# Patient Record
Sex: Male | Born: 1935 | Race: White | Hispanic: No | Marital: Married | Smoking: Former smoker
Health system: Southern US, Community
[De-identification: ages and names within clinical notes are randomized; demographics above are authoritative.]

## PROBLEM LIST (undated history)

## (undated) DIAGNOSIS — N183 Chronic kidney disease, stage 3 (moderate): Secondary | ICD-10-CM

## (undated) DIAGNOSIS — D539 Nutritional anemia, unspecified: Secondary | ICD-10-CM

## (undated) DIAGNOSIS — I447 Left bundle-branch block, unspecified: Secondary | ICD-10-CM

## (undated) DIAGNOSIS — Z9981 Dependence on supplemental oxygen: Secondary | ICD-10-CM

## (undated) DIAGNOSIS — E785 Hyperlipidemia, unspecified: Secondary | ICD-10-CM

## (undated) DIAGNOSIS — I739 Peripheral vascular disease, unspecified: Secondary | ICD-10-CM

## (undated) DIAGNOSIS — I10 Essential (primary) hypertension: Secondary | ICD-10-CM

## (undated) DIAGNOSIS — M1990 Unspecified osteoarthritis, unspecified site: Secondary | ICD-10-CM

## (undated) DIAGNOSIS — I208 Other forms of angina pectoris: Secondary | ICD-10-CM

## (undated) DIAGNOSIS — I480 Paroxysmal atrial fibrillation: Secondary | ICD-10-CM

## (undated) DIAGNOSIS — I219 Acute myocardial infarction, unspecified: Secondary | ICD-10-CM

## (undated) DIAGNOSIS — I509 Heart failure, unspecified: Secondary | ICD-10-CM

## (undated) DIAGNOSIS — I2510 Atherosclerotic heart disease of native coronary artery without angina pectoris: Secondary | ICD-10-CM

## (undated) DIAGNOSIS — I779 Disorder of arteries and arterioles, unspecified: Secondary | ICD-10-CM

## (undated) DIAGNOSIS — I519 Heart disease, unspecified: Secondary | ICD-10-CM

## (undated) DIAGNOSIS — Z5189 Encounter for other specified aftercare: Secondary | ICD-10-CM

## (undated) DIAGNOSIS — K219 Gastro-esophageal reflux disease without esophagitis: Secondary | ICD-10-CM

## (undated) HISTORY — PX: BACK SURGERY: SHX140

## (undated) HISTORY — PX: SPINAL FUSION: SHX223

## (undated) HISTORY — DX: Nutritional anemia, unspecified: D53.9

## (undated) HISTORY — DX: Essential (primary) hypertension: I10

## (undated) HISTORY — DX: Paroxysmal atrial fibrillation: I48.0

## (undated) HISTORY — DX: Peripheral vascular disease, unspecified: I73.9

## (undated) HISTORY — DX: Disorder of arteries and arterioles, unspecified: I77.9

---

## 1992-03-26 HISTORY — PX: CORONARY ARTERY BYPASS GRAFT: SHX141

## 1997-11-30 ENCOUNTER — Ambulatory Visit (HOSPITAL_COMMUNITY): Admission: RE | Admit: 1997-11-30 | Discharge: 1997-11-30 | Payer: Self-pay | Admitting: Urology

## 1998-06-24 ENCOUNTER — Ambulatory Visit: Admission: RE | Admit: 1998-06-24 | Discharge: 1998-06-24 | Payer: Self-pay | Admitting: *Deleted

## 1998-08-08 ENCOUNTER — Ambulatory Visit (HOSPITAL_BASED_OUTPATIENT_CLINIC_OR_DEPARTMENT_OTHER): Admission: RE | Admit: 1998-08-08 | Discharge: 1998-08-08 | Payer: Self-pay | Admitting: *Deleted

## 1998-08-08 ENCOUNTER — Encounter: Payer: Self-pay | Admitting: *Deleted

## 1998-10-31 ENCOUNTER — Ambulatory Visit (HOSPITAL_BASED_OUTPATIENT_CLINIC_OR_DEPARTMENT_OTHER): Admission: RE | Admit: 1998-10-31 | Discharge: 1998-10-31 | Payer: Self-pay | Admitting: *Deleted

## 1999-11-28 ENCOUNTER — Encounter: Admission: RE | Admit: 1999-11-28 | Discharge: 1999-11-28 | Payer: Self-pay | Admitting: Urology

## 1999-11-28 ENCOUNTER — Encounter: Payer: Self-pay | Admitting: Urology

## 2003-01-08 ENCOUNTER — Encounter (INDEPENDENT_AMBULATORY_CARE_PROVIDER_SITE_OTHER): Payer: Self-pay | Admitting: Specialist

## 2003-01-08 ENCOUNTER — Ambulatory Visit (HOSPITAL_COMMUNITY): Admission: RE | Admit: 2003-01-08 | Discharge: 2003-01-08 | Payer: Self-pay | Admitting: Gastroenterology

## 2003-01-12 ENCOUNTER — Inpatient Hospital Stay (HOSPITAL_COMMUNITY): Admission: EM | Admit: 2003-01-12 | Discharge: 2003-01-14 | Payer: Self-pay | Admitting: Emergency Medicine

## 2003-04-05 ENCOUNTER — Encounter (INDEPENDENT_AMBULATORY_CARE_PROVIDER_SITE_OTHER): Payer: Self-pay | Admitting: Specialist

## 2003-04-05 ENCOUNTER — Ambulatory Visit (HOSPITAL_COMMUNITY): Admission: RE | Admit: 2003-04-05 | Discharge: 2003-04-05 | Payer: Self-pay | Admitting: Gastroenterology

## 2004-04-11 ENCOUNTER — Encounter
Admission: RE | Admit: 2004-04-11 | Discharge: 2004-04-11 | Payer: Self-pay | Admitting: Physical Medicine and Rehabilitation

## 2004-06-22 ENCOUNTER — Inpatient Hospital Stay (HOSPITAL_COMMUNITY): Admission: RE | Admit: 2004-06-22 | Discharge: 2004-06-23 | Payer: Self-pay | Admitting: Specialist

## 2005-08-21 ENCOUNTER — Encounter: Admission: RE | Admit: 2005-08-21 | Discharge: 2005-08-21 | Payer: Self-pay | Admitting: Cardiovascular Disease

## 2005-08-23 ENCOUNTER — Ambulatory Visit (HOSPITAL_COMMUNITY): Admission: RE | Admit: 2005-08-23 | Discharge: 2005-08-24 | Payer: Self-pay | Admitting: Cardiovascular Disease

## 2005-11-29 ENCOUNTER — Encounter: Admission: RE | Admit: 2005-11-29 | Discharge: 2005-11-29 | Payer: Self-pay | Admitting: Orthopaedic Surgery

## 2006-07-24 ENCOUNTER — Inpatient Hospital Stay (HOSPITAL_COMMUNITY): Admission: RE | Admit: 2006-07-24 | Discharge: 2006-07-27 | Payer: Self-pay | Admitting: Orthopaedic Surgery

## 2007-03-27 HISTORY — PX: CAROTID ENDARTERECTOMY: SUR193

## 2007-04-02 ENCOUNTER — Encounter: Admission: RE | Admit: 2007-04-02 | Discharge: 2007-04-02 | Payer: Self-pay | Admitting: Cardiovascular Disease

## 2007-04-10 ENCOUNTER — Ambulatory Visit: Payer: Self-pay | Admitting: *Deleted

## 2007-04-18 ENCOUNTER — Ambulatory Visit: Admission: RE | Admit: 2007-04-18 | Discharge: 2007-04-18 | Payer: Self-pay | Admitting: *Deleted

## 2007-04-28 ENCOUNTER — Inpatient Hospital Stay (HOSPITAL_COMMUNITY): Admission: RE | Admit: 2007-04-28 | Discharge: 2007-04-29 | Payer: Self-pay | Admitting: *Deleted

## 2007-04-28 ENCOUNTER — Encounter (INDEPENDENT_AMBULATORY_CARE_PROVIDER_SITE_OTHER): Payer: Self-pay | Admitting: *Deleted

## 2007-04-28 ENCOUNTER — Ambulatory Visit: Payer: Self-pay | Admitting: *Deleted

## 2007-05-08 ENCOUNTER — Ambulatory Visit: Payer: Self-pay | Admitting: *Deleted

## 2007-09-04 ENCOUNTER — Encounter: Admission: RE | Admit: 2007-09-04 | Discharge: 2007-09-04 | Payer: Self-pay | Admitting: Orthopaedic Surgery

## 2007-10-10 ENCOUNTER — Encounter: Admission: RE | Admit: 2007-10-10 | Discharge: 2007-10-10 | Payer: Self-pay | Admitting: Orthopaedic Surgery

## 2008-01-22 ENCOUNTER — Encounter: Admission: RE | Admit: 2008-01-22 | Discharge: 2008-01-22 | Payer: Self-pay | Admitting: Orthopaedic Surgery

## 2008-05-27 ENCOUNTER — Inpatient Hospital Stay (HOSPITAL_COMMUNITY): Admission: EM | Admit: 2008-05-27 | Discharge: 2008-05-28 | Payer: Self-pay | Admitting: Emergency Medicine

## 2008-06-22 ENCOUNTER — Encounter: Admission: RE | Admit: 2008-06-22 | Discharge: 2008-06-22 | Payer: Self-pay | Admitting: Orthopaedic Surgery

## 2008-07-28 ENCOUNTER — Inpatient Hospital Stay (HOSPITAL_COMMUNITY): Admission: EM | Admit: 2008-07-28 | Discharge: 2008-08-03 | Payer: Self-pay | Admitting: Emergency Medicine

## 2008-07-29 ENCOUNTER — Encounter (INDEPENDENT_AMBULATORY_CARE_PROVIDER_SITE_OTHER): Payer: Self-pay | Admitting: Internal Medicine

## 2008-09-10 ENCOUNTER — Encounter: Payer: Self-pay | Admitting: Internal Medicine

## 2008-10-20 ENCOUNTER — Encounter: Payer: Self-pay | Admitting: Internal Medicine

## 2008-10-29 ENCOUNTER — Encounter: Payer: Self-pay | Admitting: Internal Medicine

## 2008-11-25 ENCOUNTER — Encounter: Payer: Self-pay | Admitting: Internal Medicine

## 2008-12-03 ENCOUNTER — Encounter: Payer: Self-pay | Admitting: Internal Medicine

## 2008-12-06 ENCOUNTER — Ambulatory Visit: Payer: Self-pay | Admitting: Internal Medicine

## 2008-12-06 DIAGNOSIS — R05 Cough: Secondary | ICD-10-CM

## 2008-12-06 DIAGNOSIS — R059 Cough, unspecified: Secondary | ICD-10-CM | POA: Insufficient documentation

## 2008-12-06 DIAGNOSIS — I10 Essential (primary) hypertension: Secondary | ICD-10-CM | POA: Insufficient documentation

## 2009-01-05 ENCOUNTER — Ambulatory Visit: Payer: Self-pay | Admitting: Internal Medicine

## 2009-01-05 ENCOUNTER — Encounter: Payer: Self-pay | Admitting: Adult Health

## 2009-01-19 ENCOUNTER — Ambulatory Visit: Payer: Self-pay | Admitting: Internal Medicine

## 2009-02-16 ENCOUNTER — Ambulatory Visit: Payer: Self-pay | Admitting: Internal Medicine

## 2009-03-31 ENCOUNTER — Ambulatory Visit: Payer: Self-pay | Admitting: Internal Medicine

## 2009-12-23 ENCOUNTER — Encounter: Admission: RE | Admit: 2009-12-23 | Discharge: 2009-12-23 | Payer: Self-pay | Admitting: *Deleted

## 2010-04-25 NOTE — Assessment & Plan Note (Signed)
Summary: Pulmonary/ final regular f/u refer to ENT   Copy to:  Dr. Holley Bouche Primary Provider/Referring Provider:  Dr. Holley Bouche  CC:  1 month followup.  Pt states that cough has resoloved.  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   December 06, 2008 ov with acute onset cough/st woke up one am in mid July saw Dr Nicholos Johns rx was abx no better then Dr Nicholos Johns second visit  zpak no better then Dr Tiburcio Pea first week in September rx cough med and stop lisinopril.   cough is better but voice is worse,  worse as day goes on and better first thing in am  January 05, 2009 1 month followup .  Never 100% better looses voice pretty much  every evening   then much worse starting 10/9  "Coughs all the time".  Cough is only prod in the am- green sputum.     January 19, 2009--Returns for follow up and med reivew. Last visit coreg changed to bystolic and cozaar changed to diovan. His cough  is much better, hoarseness is decreased. Not completely cough free. Using mucinex dm and tramadol which is helping. Last visit tx w/ augment and prednisone taper.   February 16, 2009 4 wk followup.  Pt states that cough is much better- still "has spells" but states these are nothing like before.  He still c/o sore throat and hoarsness no better. no excess or purulent secretions.  March 31, 2009 1 month followup.  Pt states that cough has resoloved.  He still c/o frequent need to clear throat and hoarsness- states that these are the same. throat congestion still present. hoarseness worse as day goes on .   following med calendar well.  One episode of chf in [facility] after changed back to coreg from bystolic not assoc with leg swelling (so didn't know to add extra dose lasix)  Current  Medications (verified): 1)  Multivitamins  Tabs (Multiple Vitamin) .[name].. 1 Once Daily 2)  Osteo Bi-Flex Regular Strength 250-200 Mg Tabs (Glucosamine-Chondroitin) .... Take 1 Tablet By Mouth Two Times A Day 3)  Aspirin 81 Mg Tbec (Aspirin) .[name].. 1 Once Daily 4)  Diclofenac Sodium 75 Mg Tbec (Diclofenac Sodium) .[name].. 1 Two Times A Day 5)  Folic Acid 800 Mcg Tabs (Folic Acid) .[name].. 1 Once Daily 6)  Omeprazole 20 Mg Cpdr (Omeprazole) .[name].. 1 30 Minutes Before The First and Last Meals of The Day 7)  Glipizide 10 Mg Xr24h-Tab (Glipizide) .... 2 Two Times A Day 8)  Plavix 75 Mg Tabs (Clopidogrel Bisulfate) .[name].. 1 Once Daily 9)  Pravachol 40 Mg Tabs (Pravastatin Sodium) .[name].. 1 At Bedtime 10)  Hydralazine Hcl 10 Mg Tabs (Hydralazine Hcl) .[name].. 1 Two Times A Day 11)  Lasix 40 Mg Tabs (Furosemide) .[name].. 1  Every Morning 12)  Tylenol Pm Extra Strength 500-25 Mg Tabs (Diphenhydramine-Apap (Sleep)) .... Per Bottle 13)  Tylenol 325 Mg Tabs (Acetaminophen) .... Per Bottle 14)  Benadryl 25 Mg Caps (Diphenhydramine Hcl) .... Per Bottle 15)  Furosemide 40 Mg Tabs (Furosemide) .[name].. 1 Extra Daily As Needed 16)  Viagra 100 Mg Tabs (Sildenafil Citrate) .... As Needed 17)  Mucinex Dm 30-600 Mg Xr12h-Tab (Dextromethorphan-Guaifenesin) .[name].. 1-2 Every 12 Hours As Needed  18)  Tramadol Hcl 50 Mg  Tabs (Tramadol Hcl) .... One To Two By Mouth Every 4-6 Hours As Needed 19)  Coreg 25 Mg Tabs (Carvedilol) .... One Twice Daily 20)  Valturna 300-320 Mg Tabs (Aliskiren-Valsartan) .... One Daily 21)  Pepcid Ac Maximum Strength 20 Mg Tabs (Famotidine) .... One At Bedtime  Allergies (verified): No Known Drug Allergies  Past History:  Past Medical History: Diabetes Hypertension    - ACE D/C 10/2008 due to cough Myocardial Infarction Chronic cough............................................................[name] KitchenWert     - onset July 2010 while on ace Chronic hoarseness     - ENT referral March 31, 2009   Vital Signs:  Patient  profile:   77 year old male Weight:      198 pounds BMI:     26.95 O2 Sat:      92 % on Room air Temp:     97.8 degrees F oral Pulse rate:   72 / minute BP sitting:   120 / 70  (left arm)  Vitals Entered By: Vernie Murders (March 31, 2009 3:52 PM)  O2 Flow:  Room air  Physical Exam  Additional Exam:  amb slt hoarse wm with freq throat clearing during interview and exam with classic voice fatigue/ hoarseness Wt 204 > 208 January 05, 2009 >>208 January 19, 2009 > 198 March 31, 2009  HEENT: nl dentition, turbinates, and orophanx. Nl external ear canals without cough reflex NECK :  without JVD/Nodes/TM/ nl carotid upstrokes bilaterally LUNGS: no acc muscle use, clear to A and P bilaterally without cough on insp or exp maneuvers CV:  RRR  no s3 or murmur or increase in P2, no edema  ABD:  soft and nontender with nl excursion in the supine position. No bruits or organomegaly, bowel sounds nl MS:  warm without deformities, calf tenderness, cyanosis or clubbing      Impression & Recommendations:  Problem # 1:  COUGH (ICD-786.2) c/w upper airway cough syndrome.  Upper airway cough syndrome, so named because it's frequently impossible to sort out how much is LPR vs CR/sinusitis with freq throat clearing generating secondary extra esophageal GERD from wide swings in gastric pressure that occur with throat clearing, promoting self use of mint and menthol lozenges that reduce the lower esophageal sphincter tone and exacerbate the problem further.  These symptoms are easily confused with asthma/copd by even experienced pulmonogists because they overlap so much. These are the same pts who not infrequently have failed to tolerate ace inhibitors,  dry powder inhalers or biphosphonates or report having reflux symptoms that don't respond to standard doses of PPI   For now continue max rx directed at reflux.  ent eval next step   Each maintenance medication was reviewed in detail including most  importantly the difference between maintenance and as needed and under what circumstances the prns are to be used. This was done in the context of a medication calendar review which provided the patient with a user-friendly unambiguous mechanism for medication administration and reconciliation and provides an action plan for all active problems. It is critical that this be shown to every doctor  for modification during the office visit if necessary so the patient can use it as a working document.   Medications Added to Medication List This Visit: 1)  Pravachol 40 Mg Tabs (Pravastatin sodium) .[name].. 1 at bedtime 2)  Lasix 40 Mg Tabs (Furosemide) .... 2 every morning  Other Orders: ENT Referral (ENT) Est. Patient Level III (  [address])  Patient Instructions: 1)  See Patient Care Coordinator before leaving for ENT referral for hoarseness 2)  See calendar for specific medication instructions and bring it back for each and every office visit for every healthcare provider you see.  Without it,  you may not receive the best quality medical care that we feel you deserve.  3)  Please schedule a follow-up appointment as needed.

## 2010-07-04 LAB — IRON AND TIBC
Iron: <10 ug/dL — ABNORMAL LOW (ref 42–135)
UIBC: 240 ug/dL

## 2010-07-04 LAB — GLUCOSE, CAPILLARY
Glucose-Capillary: 118 mg/dL — ABNORMAL HIGH (ref 70–99)
Glucose-Capillary: 136 mg/dL — ABNORMAL HIGH (ref 70–99)
Glucose-Capillary: 137 mg/dL — ABNORMAL HIGH (ref 70–99)
Glucose-Capillary: 144 mg/dL — ABNORMAL HIGH (ref 70–99)
Glucose-Capillary: 145 mg/dL — ABNORMAL HIGH (ref 70–99)
Glucose-Capillary: 147 mg/dL — ABNORMAL HIGH (ref 70–99)
Glucose-Capillary: 174 mg/dL — ABNORMAL HIGH (ref 70–99)
Glucose-Capillary: 182 mg/dL — ABNORMAL HIGH (ref 70–99)
Glucose-Capillary: 191 mg/dL — ABNORMAL HIGH (ref 70–99)
Glucose-Capillary: 210 mg/dL — ABNORMAL HIGH (ref 70–99)
Glucose-Capillary: 266 mg/dL — ABNORMAL HIGH (ref 70–99)
Glucose-Capillary: 355 mg/dL — ABNORMAL HIGH (ref 70–99)

## 2010-07-04 LAB — COMPREHENSIVE METABOLIC PANEL
ALT: 11 U/L (ref 0–53)
BUN: 34 mg/dL — ABNORMAL HIGH (ref 6–23)
CO2: 23 mEq/L (ref 19–32)
CO2: 23 mEq/L (ref 19–32)
Calcium: 7.9 mg/dL — ABNORMAL LOW (ref 8.4–10.5)
Calcium: 8.9 mg/dL (ref 8.4–10.5)
Creatinine, Ser: 1.42 mg/dL (ref 0.4–1.5)
GFR calc Af Amer: 59 mL/min — ABNORMAL LOW (ref >60)
GFR calc non Af Amer: 47 mL/min — ABNORMAL LOW (ref >60)
GFR calc non Af Amer: 49 mL/min — ABNORMAL LOW (ref >60)
Glucose, Bld: 203 mg/dL — ABNORMAL HIGH (ref 70–99)
Glucose, Bld: 206 mg/dL — ABNORMAL HIGH (ref 70–99)
Sodium: 135 mEq/L (ref 135–145)
Total Protein: 6.1 g/dL (ref 6.0–8.3)

## 2010-07-04 LAB — DIFFERENTIAL
Basophils Relative: 0 % (ref 0–1)
Eosinophils Absolute: 0 10*3/uL (ref 0.0–0.7)
Eosinophils Absolute: 0.1 10*3/uL (ref 0.0–0.7)
Eosinophils Relative: 0 % (ref 0–5)
Eosinophils Relative: 2 % (ref 0–5)
Lymphocytes Relative: 4 % — ABNORMAL LOW (ref 12–46)
Lymphs Abs: 0.3 10*3/uL — ABNORMAL LOW (ref 0.7–4.0)
Lymphs Abs: 1.3 10*3/uL (ref 0.7–4.0)
Monocytes Absolute: 0.1 10*3/uL (ref 0.1–1.0)
Monocytes Absolute: 0.9 10*3/uL (ref 0.1–1.0)
Monocytes Relative: 12 % (ref 3–12)
Monocytes Relative: 2 % — ABNORMAL LOW (ref 3–12)
Neutrophils Relative %: 68 % (ref 43–77)
Neutrophils Relative %: 93 % — ABNORMAL HIGH (ref 43–77)

## 2010-07-04 LAB — URINE CULTURE

## 2010-07-04 LAB — BASIC METABOLIC PANEL
BUN: 16 mg/dL (ref 6–23)
BUN: 20 mg/dL (ref 6–23)
Calcium: 8.6 mg/dL (ref 8.4–10.5)
Chloride: 101 mEq/L (ref 96–112)
Creatinine, Ser: 1.07 mg/dL (ref 0.4–1.5)
Creatinine, Ser: 1.13 mg/dL (ref 0.4–1.5)
Creatinine, Ser: 1.14 mg/dL (ref 0.4–1.5)
GFR calc Af Amer: >60 mL/min (ref >60)
GFR calc non Af Amer: >60 mL/min (ref >60)
GFR calc non Af Amer: >60 mL/min (ref >60)
GFR calc non Af Amer: >60 mL/min (ref >60)
Glucose, Bld: 140 mg/dL — ABNORMAL HIGH (ref 70–99)
Glucose, Bld: 187 mg/dL — ABNORMAL HIGH (ref 70–99)
Glucose, Bld: 192 mg/dL — ABNORMAL HIGH (ref 70–99)
Potassium: 3.5 mEq/L (ref 3.5–5.1)
Sodium: 138 mEq/L (ref 135–145)

## 2010-07-04 LAB — CBC
HCT: 28.9 % — ABNORMAL LOW (ref 39.0–52.0)
HCT: 32.9 % — ABNORMAL LOW (ref 39.0–52.0)
HCT: 32.9 % — ABNORMAL LOW (ref 39.0–52.0)
MCHC: 34.6 g/dL (ref 30.0–36.0)
MCHC: 35 g/dL (ref 30.0–36.0)
MCHC: 35.1 g/dL (ref 30.0–36.0)
MCV: 92.1 fL (ref 78.0–100.0)
MCV: 92.6 fL (ref 78.0–100.0)
MCV: 93.8 fL (ref 78.0–100.0)
Platelets: 154 10*3/uL (ref 150–400)
Platelets: 238 10*3/uL (ref 150–400)
RBC: 2.97 MIL/uL — ABNORMAL LOW (ref 4.22–5.81)
RBC: 3.14 MIL/uL — ABNORMAL LOW (ref 4.22–5.81)
RDW: 13.3 % (ref 11.5–15.5)
WBC: 5.9 10*3/uL (ref 4.0–10.5)
WBC: 7.2 10*3/uL (ref 4.0–10.5)

## 2010-07-04 LAB — BRAIN NATRIURETIC PEPTIDE
Pro B Natriuretic peptide (BNP): 379 pg/mL — ABNORMAL HIGH (ref 0.0–100.0)
Pro B Natriuretic peptide (BNP): 657 pg/mL — ABNORMAL HIGH (ref 0.0–100.0)
Pro B Natriuretic peptide (BNP): 747 pg/mL — ABNORMAL HIGH (ref 0.0–100.0)

## 2010-07-04 LAB — TSH: TSH: 0.691 u[IU]/mL (ref 0.350–4.500)

## 2010-07-04 LAB — CK TOTAL AND CKMB (NOT AT ARMC)
CK, MB: 1.3 ng/mL (ref 0.3–4.0)
Relative Index: INVALID (ref 0.0–2.5)
Total CK: 37 U/L (ref 7–232)

## 2010-07-04 LAB — RETICULOCYTES
Retic Count, Absolute: 21.6 10*3/uL (ref 19.0–186.0)
Retic Ct Pct: 0.7 % (ref 0.4–3.1)

## 2010-07-04 LAB — APTT: aPTT: 39 seconds — ABNORMAL HIGH (ref 24–37)

## 2010-07-04 LAB — URINALYSIS, ROUTINE W REFLEX MICROSCOPIC
Bilirubin Urine: NEGATIVE
Glucose, UA: 250 mg/dL — AB
Ketones, ur: NEGATIVE mg/dL
Nitrite: NEGATIVE
Specific Gravity, Urine: 1.015 (ref 1.005–1.030)
Urobilinogen, UA: 0.2 mg/dL (ref 0.0–1.0)
pH: 6 (ref 5.0–8.0)

## 2010-07-04 LAB — LACTATE DEHYDROGENASE: LDH: 113 U/L (ref 94–250)

## 2010-07-04 LAB — CULTURE, BLOOD (ROUTINE X 2)

## 2010-07-04 LAB — CARDIAC PANEL(CRET KIN+CKTOT+MB+TROPI): CK, MB: 1.6 ng/mL (ref 0.3–4.0)

## 2010-07-04 LAB — URINE MICROSCOPIC-ADD ON

## 2010-07-06 LAB — APTT: aPTT: 32 seconds (ref 24–37)

## 2010-07-06 LAB — CBC
HCT: 30.4 % — ABNORMAL LOW (ref 39.0–52.0)
HCT: 36.9 % — ABNORMAL LOW (ref 39.0–52.0)
Hemoglobin: 10.3 g/dL — ABNORMAL LOW (ref 13.0–17.0)
Platelets: 245 10*3/uL (ref 150–400)
RDW: 13 % (ref 11.5–15.5)
WBC: 10.9 10*3/uL — ABNORMAL HIGH (ref 4.0–10.5)
WBC: 8 10*3/uL (ref 4.0–10.5)

## 2010-07-06 LAB — POCT I-STAT, CHEM 8
BUN: 37 mg/dL — ABNORMAL HIGH (ref 6–23)
Chloride: 107 mEq/L (ref 96–112)
Creatinine, Ser: 1.5 mg/dL (ref 0.4–1.5)
Potassium: 4.6 mEq/L (ref 3.5–5.1)
Sodium: 139 mEq/L (ref 135–145)
TCO2: 24 mmol/L (ref 0–100)

## 2010-07-06 LAB — URINALYSIS, ROUTINE W REFLEX MICROSCOPIC
Glucose, UA: NEGATIVE mg/dL
Hgb urine dipstick: NEGATIVE
Protein, ur: NEGATIVE mg/dL
Specific Gravity, Urine: 1.008 (ref 1.005–1.030)
pH: 6.5 (ref 5.0–8.0)

## 2010-07-06 LAB — DIFFERENTIAL
Basophils Absolute: 0 10*3/uL (ref 0.0–0.1)
Eosinophils Relative: 1 % (ref 0–5)
Eosinophils Relative: 1 % (ref 0–5)
Lymphocytes Relative: 12 % (ref 12–46)
Lymphocytes Relative: 19 % (ref 12–46)
Lymphs Abs: 1.3 10*3/uL (ref 0.7–4.0)
Lymphs Abs: 1.5 10*3/uL (ref 0.7–4.0)
Monocytes Absolute: 0.7 10*3/uL (ref 0.1–1.0)
Monocytes Relative: 9 % (ref 3–12)
Neutro Abs: 9.1 10*3/uL — ABNORMAL HIGH (ref 1.7–7.7)
Neutrophils Relative %: 84 % — ABNORMAL HIGH (ref 43–77)

## 2010-07-06 LAB — HEMOGLOBIN A1C
Hgb A1c MFr Bld: 7.3 % — ABNORMAL HIGH (ref 4.6–6.1)
Mean Plasma Glucose: 163 mg/dL

## 2010-07-06 LAB — COMPREHENSIVE METABOLIC PANEL
ALT: 16 U/L (ref 0–53)
Albumin: 3.8 g/dL (ref 3.5–5.2)
Calcium: 8.8 mg/dL (ref 8.4–10.5)
Glucose, Bld: 168 mg/dL — ABNORMAL HIGH (ref 70–99)
Potassium: 4.5 mEq/L (ref 3.5–5.1)
Sodium: 139 mEq/L (ref 135–145)
Total Protein: 6 g/dL (ref 6.0–8.3)

## 2010-07-06 LAB — BASIC METABOLIC PANEL
BUN: 36 mg/dL — ABNORMAL HIGH (ref 6–23)
Chloride: 107 mEq/L (ref 96–112)
Creatinine, Ser: 1.35 mg/dL (ref 0.4–1.5)
Glucose, Bld: 153 mg/dL — ABNORMAL HIGH (ref 70–99)
Potassium: 4.2 mEq/L (ref 3.5–5.1)

## 2010-07-06 LAB — GLUCOSE, CAPILLARY
Glucose-Capillary: 142 mg/dL — ABNORMAL HIGH (ref 70–99)
Glucose-Capillary: 159 mg/dL — ABNORMAL HIGH (ref 70–99)
Glucose-Capillary: 181 mg/dL — ABNORMAL HIGH (ref 70–99)
Glucose-Capillary: 219 mg/dL — ABNORMAL HIGH (ref 70–99)

## 2010-07-06 LAB — TSH: TSH: 0.75 u[IU]/mL (ref 0.350–4.500)

## 2010-07-06 LAB — PROTIME-INR
INR: 1.2 (ref 0.00–1.49)
Prothrombin Time: 15.9 seconds — ABNORMAL HIGH (ref 11.6–15.2)

## 2010-07-06 LAB — CK: Total CK: 198 U/L (ref 7–232)

## 2010-07-06 LAB — CARDIAC PANEL(CRET KIN+CKTOT+MB+TROPI): Relative Index: 2.2 (ref 0.0–2.5)

## 2010-08-08 NOTE — Assessment & Plan Note (Signed)
OFFICE VISIT   [name], [name]  DOB:  [birth year]                                       04/10/2007  ZOXWR#:[id number]   HISTORY:  The patient returns to the office today with progression of  his right carotid stenosis.  He was seen by me in the office in July of  2007 with a moderate to severe right internal carotid artery stenosis  with ulceration.  At that time he was asymptomatic.  An arteriography  revealed this to be approximately 60%.   He has been followed at the [facility] office by Dr. Allyson Sabal with evidence now  of progression of his right internal carotid artery stenosis.  Velocities measured 524/174 cm per second.  These are consistent with a  severe stenosis.   Fortunately he has been free of any symptoms.  Denies sensory, motor or  visual deficit.  No speech problems.  No gait abnormality.   He recently underwent cardiac catheterization which revealed 4 of his 5  bypass grafts which were initially placed in 1994 to be patent.  His EF  has dropped mildly and is in the 35% range.  He has no history of  congestive heart failure.   PAST MEDICAL HISTORY:  1. Coronary artery disease status post coronary artery bypass 1994.  2. Hypertension.  3. Hyperlipidemia.  4. Type 2 diabetes.   MEDICATIONS:  1. Metformin 500 mg 2 tablets q. a.m. and 3 tablets daily at bedtime.  2. Depakote ER 250 mg 2 tablets q. a.m.  3. Amlodipine 10 mg q. a.m.  4. Folic acid 800 mcg daily.  5. Lasix 40 mg daily.  6. Omeprazole EC 20 mg daily.  7. Aspirin 81 mg daily.  8. Plavix 75 mg daily.  9. Multivitamin 1 tablet daily.  10.Coreg 6.25 mg 2 tablets b.i.d.  11.Diclofenac EC 75 mg b.i.d.  12.Glipizide 10 mg 2 tablets b.i.d.  13.Hydralazine 10 mg 1 tablet b.i.d.  14.Simvastatin 40 mg 1/2 tablet daily at bedtime.  15.Lisinopril 40 mg 1.5 tablets daily.  16.Temazepam 30 mg daily at bedtime p.r.n.  17.Glucosamine HCL with MSM 1500/1500 one tablet b.i.d.   ALLERGIES:   None known.   FAMILY HISTORY:  Mother died at age 57 with coronary artery disease.  Father died of pancreatic cancer at age 68.   SOCIAL HISTORY:  The patient is married with 2 grown children.  He lives  with his wife.  He is retired.  He does not use tobacco products.  He  discontinued tobacco use in 1960.  He does not consume alcohol on a  regular basis.   REVIEW OF SYSTEMS:  He has lost about 20-25 pound dieting.  He denies  any anorexia.  No cough or sputum production.  No recent chest pain or  shortness of breath.  Denies orthopnea.  No abdominal pain.  Bowel  habits are regular.  No dysuria or frequency.  Mild joint discomfort.  No skin rashes or ulceration.  No acute visual or hearing loss.   PHYSICAL EXAMINATION:  General:  Moderately obese 77 year old male,  alert and oriented.  In no acute distress.  Vital signs:  BP 175/83,  pulse 69 per minute, respirations 18 per minute, O2 saturation 97%.  HEENT:  Mouth and throat are clear.  Extraocular movements intact.  No  scleral icterus.  Atraumatic.  Neck:  Supple.  No thyromegaly or  adenopathy.  Respiratory:  Equal air entry bilaterally.  Chest is clear.  No rales or rhonchi.  Cardiovascular:  Right carotid bruit.  Normal  heart sounds without murmurs.  Regular rate and rhythm.  Abdomen:  Mild  to moderate obesity.  Nontender.  Bowel sounds active.  No bruits.  No  organomegaly or masses.  Lower extremities:  2+ femoral pulses  bilaterally.  No ankle edema.  Musculoskeletal:  No joint deformity.  Full range of motion.  Neurological:  Cranial nerves intact.  Strength  equal bilaterally.  One plus reflexes.  Normal sensation.  Skin:  Warm  and dry.  No ulceration or rash.   IMPRESSION:  1. Asymptomatic severe progressive right internal carotid artery      stenosis.  2. Coronary artery disease.  3. Left ventricle dysfunction.  4. Type 2 diabetes.  5. Hypertension.  6. Hyperlipidemia.   MEDICAL DECISION MAKING:  The patient  shows evidence of right internal  carotid artery stenosis.  This poses a risk of potential stroke.  I have  discussed options of carotid stenting versus endarterectomy.  He has  never been in congestive heart failure though he does have depressed LV  function I do feel he is a good candidate to undergo operative  management.  I have explained the details of right carotid  endarterectomy including the potential risks with the major morbidity  and mortality of 1-2% to include but not limited to MI, CVA, cranial  nerve injury or death.  He has consented for surgery.  This will be  carried out on 04/18/2007 at [facility].  Planned overnight  hospital stay with discharge postop day 1 barring complications.   Chronic medical conditions appear well controlled at this time.  No  recent chest pain.  Mild reduction in EF without congestive heart  failure.  Diabetes controlled with medication and diet.   Balinda Quails, M.D.  Electronically Signed   PGH/MEDQ  D:  04/10/2007  T:  04/11/2007  Job:  [id number]   cc:   Nanetta Batty, M.D.  Melida Quitter, M.D.

## 2010-08-08 NOTE — Op Note (Signed)
NAMEJOZEPH, PERSING           ACCOUNT NO.:  [id number]   MEDICAL RECORD NO.:  [id number]          PATIENT TYPE:  INP   LOCATION:  2550                         FACILITY:  [facility]   PHYSICIAN:  Balinda Quails, M.D.    DATE OF BIRTH:  1932/12/16   DATE OF PROCEDURE:  04/28/2007  DATE OF DISCHARGE:                               OPERATIVE REPORT   SURGEON:  Denman George, M.D.   ASSISTANT:  Jerold Coombe, P.A.   ANESTHETIC:  General endotracheal.   PREOPERATIVE DIAGNOSIS:  Severe progressive right internal carotid  artery stenosis.   POSTOPERATIVE DIAGNOSIS:  Severe progressive right internal carotid  artery stenosis.   PROCEDURE:  Right carotid endarterectomy with Dacron patch angioplasty.   CLINICAL NOTE:  [name] is a 77 year old male with known right  carotid stenosis.  This been followed in the office and also by  [facility].  This has now progressed  to severe.  The patient was referred back to the office, seen and  evaluated, and scheduled to undergo right carotid endarterectomy.  Risks  of the operative procedure explained to the patient in detail with major  morbidity/mortality of 1-2% to include but not limited MI, CVA, cranial  nerve injury, death.   OPERATIVE PROCEDURE:  The patient was brought to the operating room in  stable hemodynamic condition.  Placed under general endotracheal  anesthesia.  The right neck was prepped and draped in a sterile fashion.  Arterial line and Foley catheter in place.   Curvilinear skin incision made along the anterior border of the right  sternomastoid muscle.  Dissection carried down through subcutaneous  tissue with electrocautery.  Platysma divided.  Deep dissection carried  along the anterior border of the sternomastoid.  The facial vein ligated  with 3-0 silk and divided.  Carotid bifurcation exposed.  The common  carotid artery mobilized down to the omohyoid muscle and  encircled with  vessel loop.  The origin of the superior thyroid and external carotid  were freed and encircled with vessel loops.  The internal carotid artery  followed distally up to the posterior belly of the digastric muscle.  The hypoglossal nerve mobilized and retracted superiorly.  The distal  internal carotid artery encircled with a vessel loop.   Evaluation of carotid bifurcation verified significant plaque in the  bulb extending into the right internal carotid artery origin.  Beyond  this, the artery was small in caliber but free of plaque.  The patient  administered 7000 units heparin intravenously.  Adequate circulation  time permitted.  The carotid vessels were controlled with clamps.  Longitudinal arteriotomy made in the distal common carotid artery.  The  arteriotomy extended across the carotid bulb and up into the internal  carotid artery.  There was a large amount of plaque present in the  internal carotid artery which was friable and ulcerated.  A high-grade  stenosis present at the origin of the right internal carotid artery.   A shunt was inserted.  The plaque then removed with an endarterectomy  elevator.  The endarterectomy carried down to the common  carotid artery.  The plaque was divided transversely with Potts scissors.  Plaque then  raised up into the bulb of the superior thyroid and external carotid  endarterectomized using an eversion technique.  The distal internal  carotid artery plaque feathered out well.  Fragments of plaque then  removed with fine forceps.  Site irrigated with heparin saline solution.   A patch angioplasty endarterectomy site was then carried out with a  Finesse Dacron patch using running 6-0 Prolene suture.  At completion of  the patch angioplasty, the shunt was removed.  All vessels well flushed.  Initial antegrade flow then directed up the external carotid, the  internal carotid then released.  Excellent pulse and Doppler signal in   the distal internal carotid artery.  The patient administered 50 mg of  protamine intravenously.  Adequate circulation time permitted.   Sponge and instrument counts correct.  Sternomastoid fascia closed with  a running 2-0 Vicryl suture.  Platysma closed with a running 3-0 Vicryl  suture.  Skin closed with 4-0 Monocryl.  Dermabond applied.   Anesthesia reversed in the operating room..  The patient was  neurologically intact.  Transferred to recovery room in stable condition  without apparent complications.      Balinda Quails, M.D.  Electronically Signed     PGH/MEDQ  D:  04/28/2007  T:  04/28/2007  Job:  [id number]

## 2010-08-08 NOTE — Assessment & Plan Note (Signed)
OFFICE VISIT   [name], [name] G  DOB:  05-17-1932                                       05/08/2007  ZOXWR#:[id number]   Patient underwent a right carotid endarterectomy for severe progressive  stenosis on 04/28/07 at [facility].  Presents today for  postoperative evaluation.   No complaints at this time.  Swallowing well.  Mild sore throat  following the surgery.   Right neck incision healing unremarkably.  Blood pressure noted to be  moderately elevated at 179/88.  Pulse is 69.  Respirations 18 per  minute.  O2 sat 96%.   Patient is doing well.  We will plan to follow up with him per routine  carotid protocol.   Balinda Quails, M.D.  Electronically Signed   PGH/MEDQ  D:  05/08/2007  T:  05/10/2007  Job:  [id number]   cc:   Melida Quitter, M.D.  Nanetta Batty, M.D.

## 2010-08-08 NOTE — Discharge Summary (Signed)
[name], [name]           ACCOUNT NO.:  [id number]   MEDICAL RECORD NO.:  [id number]          PATIENT TYPE:  INP   LOCATION:  3308                         FACILITY:  [facility]   PHYSICIAN:  Balinda Quails, M.D.    DATE OF BIRTH:  1933-02-01   DATE OF ADMISSION:  04/28/2007  DATE OF DISCHARGE:  04/29/2007                               DISCHARGE SUMMARY   ADMISSION DIAGNOSIS:  Right internal carotid stenosis, asymptomatic.   DISCHARGE DIAGNOSIS:  Right internal carotid artery stenosis,  asymptomatic.   DISCHARGE SECONDARY DIAGNOSES:  1. Coronary artery disease status post coronary artery bypass in 1994.  2. Hypertension.  3. Hyperlipidemia.  4. Type 2 diabetes.   PROCEDURE:  Right carotid endarterectomy done by Dr. Madilyn Fireman on April 28, 2007.   CONSULTANTS:  None.   BRIEF HISTORY AND PHYSICAL:  This patient returned to the office on  April 10, 2007, to see Dr. Madilyn Fireman with progression of his right carotid  stenosis.  He was seen by Dr. Madilyn Fireman in the office in July 2007 with a  moderate to severe right internal carotid artery stenosis with  ulceration.  At that time, he was asymptomatic.  An arteriography  revealed this to be approximately 60%.  He has been followed at the  office and at [facility] by Dr. Allyson Sabal with evidence and now progression of his  right internal carotid artery stenosis.  Velocities measured 525/174  cm/sec.  These are consistent with severe stenosis.  Fortunately, he has  been free of any symptoms.  Denies sensory, motor, or visual dystocia.  No speech problems.  No gait abnormalities.  He recently underwent  cardiac catheterization which revealed 4 of his 5 bypass grafts which  were initially placed in 1994 to be patent.  His ejection fraction has  dropped mildly and is in the 35% range.  He has no history of congestive  heart failure.  The patient showed evidence of right internal carotid  artery stenosis.  This poses a risk of potential stroke.  Dr.  Madilyn Fireman  discussed with the patient options of carotid stenting versus  endarterectomy.  He has never been in congestive heart failure, though  he does have depressed left ventricular function.  Dr. Madilyn Fireman does feel  that he is a good candidate to undergo operative management.  Dr. Madilyn Fireman  explained the details of right carotid endarterectomy including the  potential risk with the major morbidity and mortality of 1-2% to include  but not limited to MI, CVA, cranial nerve injury or death.  He has  consented for surgery.  This was tentatively was planned for April 18, 2007, but then was changed for April 28, 2007, at [facility].  Planned overnight hospital stay with discharge postop day #1 bearing  complications.  Chronic medical conditions appear well controlled at  this time.  The patient has no recent chest pain and the mild reduction  in the ejection fraction without congestive heart failure.  Diabetes is  controlled with medication and diet.  At time of surgery on an  admittance to [facility] on April 28, 2007,  the plan was to  continue with the right carotid endarterectomy and the patient had no  changes since prior office visit on April 10, 2007.   HOSPITAL COURSE:  The patient was admitted to the hospital, [facility],  April 28, 2007, to undergo right carotid endarterectomy by Dr. Denman George.  The patient was taken to the OR where he continued to remain  stable and tolerated procedure well.  The patient was then transferred  to PACU and remained stable and then later transferred to 4330 where he  continued to remain stable overnight.  The patient did complain of some  nausea in the night but that is now improved.  Postop day #1, the  patient's vitals, his blood pressure was 119/45, heart rate 73 in sinus  rhythm, respiration 12, temperature 98.4, oxygen 94, and he was on nasal  2 liters, and now he has received nasal oxygen and is doing fine.   LABS:   White blood count 9.4, hemoglobin 10.4, hematocrit 30.5,  platelets 215.  Sodium 139, potassium 3.9, chloride 104, bicarb 27, BUN  29, creatinine 1.19, and glucose currently at 192.  Calcium is at 8.8.  The patient states he had some nausea last night in the p.m. postop but  better now.  The patient complains also of some neck pain just basically  being sore due to surgery and a little bit of a mild headache that he  feels related to the soreness.  The patient does state that the headache  is not severe.  The patient states there is no visual or hearing  changes.  There is no decrease in sensation, no weakness.  He has voided  post Foley DC.  The patient has ambulated with no complications.  The  patient is drinking without any difficulty swallowing.  The patient has  not ate breakfast yet both feels comfortable with going home if he has  no complications with this.   PHYSICAL EXAMINATION:  The patient is alert and oriented x3 in chair.  HEART:  Normal rate, regular rhythm.  LUNGS:  Clear bilaterally.  ABDOMEN:  Soft, nontender, nondistended with active bowel sounds.  SKIN:  The right incision on the neck is clean and dry with no edema, no  hematoma, and it looks good.  NEUROLOGIC:  The patient is intact with no deviation of the tongue, no  decrease in sensation or strength.   ASSESSMENT AND PLAN:  Status post right carotid endarterectomy done by  Dr. Madilyn Fireman is a discharge to home if the patient can eat and tolerate  food well with no complications and continues to remain stable  throughout the morning.  The patient agrees with this plan.   DISCHARGE MEDICATIONS:  1. Metformin 500 mg 2 tablets in the morning and 3 tablets at bedtime.  2. Amlodipine 10 mg in the morning.  3. Folic acid 800 mg daily.  4. Lasix 40 mg daily.  5. Omeprazole EC 20 mg daily.  6. Aspirin 81 mg daily.  7. Plavix 75 mg daily.  8. MVI 1 tablet daily.  9. Coreg 6.25 mg 2 tablets x2 daily.  10.Glipizide 10  mg 2 tablets daily.  11.Multivitamin 1 tablet daily.  12.Diclofenac EC 75 mg b.i.d.  13.Hydralazine 10 mg 1 tablet b.i.d.  14.Simvastatin 40 mg 1-2 tablets daily at bedtime.  15.Lisinopril 40 mg 1.5 tablets daily.  16.Temazepam 30 mg daily q.h.s. p.r.n.  17.Glucosamine HCl with MSN 1500 1 tab b.i.d.  18.Also, the patient  was given Vicodin 5 mg to take 1-2 tablets q.4-      6h. p.r.n. pain.   DISCHARGE INSTRUCTIONS:  Discharge is indicated for April 29, 2007, as  long as the patient continues to remain stable throughout the day and is  able to eat and tolerate food.  Discharge instructions were discussed  with the patient.  The patient states that he understands these  instructions.  The patient was instructed to do no driving or lifting  for the next few weeks and to increase activity slowly.  The patient  also may shower and bathe starting on April 30, 2007.  The patient is  to clean wound gently with soap and water.  The patient also is to  resume medications per home reconciliation sheet.  The patient also is  given a prescription to take Vicodin 5 mg 1-2 tablets every 4 hours as  needed for pain.  The patient also was instructed to follow up with Dr.  Madilyn Fireman in 2 weeks for follow-up appointment; office will contact.  The  patient is also given instructions to contact the office or ER if any  fever greater than 101, redness, drainage from incision, any  neurological changes in speech, visual changes, or such as a severe  headache.  Again, the patient stated that he understood these  instructions and is okay with discharge to home later today if he can  eat and tolerate food and can currently remain stable.      Cyndy Freeze, PA      P. Liliane Bade, M.D.  Electronically Signed    ALW/MEDQ  D:  04/29/2007  T:  04/29/2007  Job:  [id number]

## 2010-08-08 NOTE — H&P (Signed)
[name], [name]           ACCOUNT NO.:  [id number]   MEDICAL RECORD NO.:  [id number]          PATIENT TYPE:  INP   LOCATION:  1226                         FACILITY:  [facility]   PHYSICIAN:  Michiel Cowboy, MDDATE OF BIRTH:  Nov 21, 1932   DATE OF ADMISSION:  07/28/2008  DATE OF DISCHARGE:                              HISTORY & PHYSICAL   PRIMARY CARE [name]:  Dr. Holley Bouche.   CARDIOLOGIST:  Dr. Allyson Sabal, [facility].   CHIEF COMPLAINT:  Fever.   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  He said he took over 100 ticks  off of him or more, some small size, some more larger ticks.  Yesterday,  he developed a fever up to 103, he presented to his primary care  [name] who started him on doxycycline, but he continued to have fevers  and feeling not well at which point he presented to the emergency  department where he was thought to be slightly hypotensive with blood  pressures between 90s to 80s systolic.  He was given IV fluids to which  he responded well.  His blood pressure started to come up, but [facility]  hospitalist was called for an admission given hypertension and fevers.  Of note, his urine did show urinary tract infection.  Of note, also Dr.  Tiburcio Pea had already obtained [facility] and Lyme disease serologies.   PAST MEDICAL HISTORY:  1. Coronary disease.  2. Diabetes.  3. Hyperlipidemia.  4. Hypertension.  5. CHF with EF between 30-40%.  6. Left Bundle-branch block.  7. GERD.  8. Hiatal hernia.  9. Right internal carotid artery stenosis.  10.Spine stenosis.  11.History of spinal fusion.  12.Migraine headaches.  13.History of kidney stones.  14.Per family, the  patient may have had a tick-borne illness before.   SOCIAL HISTORY:  The patient is to smoke 40 years ago.  Drinks  occasional but very rarely.  Does not abuse drugs.  Lives at home.  Has  supportive family.  His daughter is actually working here for [employer].   FAMILY HISTORY:  Noncontributory.   ALLERGIES:  No known drug allergies.   MEDICATIONS:  1. Metformin 1000 mg in the morning and 1500 mg at bedtime.  2. Norvasc 10 mg daily.  3. Folic acid 800 mg daily.  4. Aspirin 81 mg daily.  5. Plavix 75 mg daily.  6. Multivitamins.  7. Coreg 25 mg twice daily.  8. Diclofenac.  9. Glipizide 20 mg twice a day.  10.Hydralazine 10 mg twice a day.  11.Pravastatin 20 mg at bedtime.  12.Lisinopril 60 mg daily.  13.Glucosamine.  14.Lasix 40 mg daily.  15.Omeprazole 20 mg as needed.  16.Diazepam 30 mg at bedtime.  He takes this extremely rarely.  17.Recently was started on doxycycline 100 mg p.o. twice  a day.   PHYSICAL EXAMINATION:  VITAL SIGNS:  Initial temperature here 102, blood  pressure initially was 99/57, now up to 106/60.  Pulse initially 108,  now 81, respirations 20, satting 90% on room air and 93-95% on 2 liters.  The patient appears to be currently in no acute distress.  HEAD:  Nontraumatic.  Dry mucous membranes.  LUNGS:  Somewhat distant breath sounds bilaterally.  Mild crackles at  the bases.  HEART:  Regular rate and rhythm.  No murmurs appreciated.  ABDOMEN:  Soft, nontender, nondistended.  LOWER EXTREMITIES:  Without clubbing, cyanosis, edema.  SKIN:  Significant for multiple bites over his lower extremities.  NEUROLOGICAL:  The patient appears to be intact.   LABORATORY DATA:  Labs white blood cell count 5.9, hemoglobin 11, sodium  135, potassium 5.5, creatinine 1.48 which I do not think he has a  history of chronic renal disease, assuming this is new.  Of note, his  creatinine was slightly elevated in March 2010 at 1.35.  LFTs within  normal  limits except for an albumin being slightly low as 3.1.  Lactic  acid 1.6.  UA showing white blood cells too numerous to count.  Urine  cultures and blood cultures pending.  Chest x-ray showing scar on the  right, but otherwise, unremarkable.   EKG showing left bundle branch block which is old and a change from  prior.   ASSESSMENT AND PLAN:  This is a 77 year old gentle history of coronary  artery disease who presents with fever, tick bites and urinary tract  infection.  1. Fever.  This either is related to tick bites, some sort of tick-      borne illness versus a urinary tract infection.  Will, for now,      continue doxycycline.  RMSF serologies have been obtained, probably      not as helpful.  His LFTs are not elevated.  His platelets normal.      Will check LDH as this can sometimes be also elevated in tick-borne      illnesses.  Continue to watch clinically.  2. Urinary tract infection.  Will cover with Rocephin.  Obtain renal      ultrasound as the patient was fairly toxic to evaluate for any      hydro or urinary tract abscess.  3. Slight elevation of creatinine.  The patient appears to be      dehydrated clinically.  Will give IV fluids and careful given      history of CHF.  4. History of CHF.  Will hold Lasix, hypotensive and dehydrated.  Be      careful with fluids.  Check orthostatics and follow BNP.  5. Hypertension.  Early SIRS.  Will admit to step-down.  Currently      improving.  Responding to IV fluids.  Continue IV antibiotics.      Gentle IV fluids to avoid fluid overload if possible.  The patient      is nontoxic-appearing.  Await blood cultures and urine culture.  6. History of diabetes.  Will hold metformin in case he needs some      studies, also given his creatinine elevation.  Will replace      glipizide, as he may not eat as much in house, and do sliding      scale.  Check hemoglobin A1c.  7. History of hypertension.  Hold BP medications as he is  currently      hypotensive.  Will  be able to restart him carefully in the later      time hopefully.  8. History of coronary artery disease, fairly stable.  Will get one      set of cardiac markers.  Continue Plavix and aspirin.  9. Hyperlipidemia.  Continue pravastatin.  10.Prophylaxis.  Protonix plus Lovenox.   CODE STATUS:  The patient is full code per wishes of his family and  himself.      Michiel Cowboy, MD  Electronically Signed     AVD/MEDQ  D:  07/28/2008  T:  07/29/2008  Job:  [id number]   cc:   Holley Bouche, M.D.  Fax: [phone]

## 2010-08-08 NOTE — Discharge Summary (Signed)
NAMEPADEN, SENGER           ACCOUNT NO.:  [id number]   MEDICAL RECORD NO.:  [id number]          PATIENT TYPE:  INP   LOCATION:  1431                         FACILITY:  [facility]   PHYSICIAN:  Kela Millin, M.D.DATE OF BIRTH:  Mar 26, 1933   DATE OF ADMISSION:  07/28/2008  DATE OF DISCHARGE:  08/03/2008                               DISCHARGE SUMMARY   DISCHARGE DIAGNOSES:  1. Escherichia coli urinary tract infection.  2. Escherichia coli sepsis.  3. Elevated troponins - per cardiology secondary to #2.  4. Coronary artery disease, status post coronary artery bypass      grafting in 1994.  5. Congestive heart failure - compensated.  6. Diabetes mellitus.  7. Hypertension.   PROCEDURES AND STUDIES:  1. Negative renal ultrasound.  2. A 2-D echocardiogram - left ventricle systolic function mildly to      moderately reduced, ejection fraction 40-50%.  There is severe      hypokinesis of the basal mid inferolateral myocardium.  Moderate      tricuspid valve regurgitation.  The left atrial appendage is      severely dilated.   CONSULTATIONS:  Cardiology - [facility]/Dr. Jacinto Halim.   BRIEF HISTORY:  The patient is a pleasant 77 year old white male with  the above-listed medical problems who presented with complaints of  fevers.  He reported that he had been diagnosed with a urinary tract  infection about 2 weeks prior to admission and was treated with Cipro.  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  On the day prior to  admission, he saw his primary care physician and was found to have a  fever up to 103, and he was started on doxycycline.  The next day, he  continued to have fevers and so he came to the ED and at that time was  found to be hypotensive with systolic blood pressures in the 80s-90s.  He was given IV fluids and his blood pressures responded well to that  and he was  admitted for further evaluation and management.   Please see the full admission history and physical dictated on Jul 28, 2008, by Dr. Adela Glimpse for the details of the admission physical exam, as  well as the laboratory data.   HOSPITAL COURSE:  1. E-coli urinary tract infection and E-coli sepsis - upon admission,      blood and urine cultures were obtained, a urinalysis was consistent      with a UTI and the patient was empirically started on IV      antibiotics.  As mentioned in the HPI, he was hypotensive initially      and was hydrated with IV fluids and his Lasix was held initially.      His blood pressures responded well to this intervention.  The urine      cultures grew E-coli and the blood cultures grew E-coli as well and      the E-coli was pansensitive.  He subsequently defervesced on  Rocephin.  He has done well - tolerating p.o. well and he is      hemodynamically stable and afebrile.  He has had no leukocytosis -      his last white cell count prior to discharge is 7.2.  He will be      discharged on oral antibiotics to complete a 2-week course.  He is      to follow up with his primary care physician.  2. Tick bites - as mentioned in the HPI, the patient had, had several      tick bites and saw his primary care physician initially and was      empirically started on doxycycline and [facility] spotted fever      serologies and Lyme serologies were done.  Upon admission, he was      maintained on doxycycline.  I obtained the results of those      serologies from Dr. Tiburcio Pea' office and they showed that the Lyme      was negative and the [facility] spotted fever IgG was positive,      but the IgM negative.  I discussed those findings with the      infectious disease physician - Dr. Sampson Goon and his      recommendation is that the patient has received 6 days of      doxycycline already and will not require any further doxycycline as      he does not have an active  [facility] spotted fever infection.  3. Elevated troponins - the patient had cardiac enzymes done upon      admission and those were elevated - high as 0.34.  The patient was      chest pain free.  The impression was that this was likely secondary      to sepsis.  A 2-D echocardiogram was done and the results as stated      above with severe hypokinesis of the basal mid inferolateral      myocardium.  I obtained a copy of the patient's January      echocardiogram at the office and the area of hypokinesis appeared      to be different and so I consulted cardiology.  Dr. Jacinto Halim with      [facility] saw the patient and does not      recommend any further cardiac workup - he states that he would have      expected a much larger troponin increase with positive CK MBs if      the patient had an MI.  He did recommend discharging the patient on      Maxzide instead of the Lasix that he has been on.  He discussed      those changes with the patient and his family and they have      indicated that they prefer to continue taking his preadmission      cardiac medications and follow up with Dr. Allyson Sabal, who is his      primary cardiologist for his opinion regarding this change before      they go through with it, he also wants to continue his lisinopril      60 mg daily as previously until again he sees Dr. Allyson Sabal at the      office.  4. History of coronary artery disease, status post CABG in June 1994 -      as above, he remained chest pain free in  the hospital and he is to      follow up with Dr. Allyson Sabal.  5. Diabetes mellitus - his Accu-Cheks were monitored in the hospital      and he was maintained on Glucotrol.  He is to continue his      outpatient medications upon discharge.  6. Hypertension - he was maintained on his Coreg and Norvasc during      his hospital stay.  His lisinopril was held initially and was      restarted prior to discharge.  7. Mild renal insufficiency -  the patient's creatinine on admission      was 1.48, his lisinopril was held and with hydration this has      resolved.  His last creatinine prior to discharge was 1.13.  He had      a renal ultrasound done which was negative.   DISCHARGE MEDICATIONS:  1. Ceftin 500 mg 1 p.o. b.i.d. x7 more days.  2. The patient is to continue metformin 500 mg 2 p.o. q.a.m. and 3      p.o. nightly.  3. Norvasc 10 mg p.o. daily.  4. Folic acid 800 mcg daily.  5. Aspirin 81 mg p.o. daily.  6. Plavix 75 mg daily.  7. Multivitamin p.o. daily.  8. Coreg 25 mg p.o. b.i.d.  9. Diclofenac 75 mg as previously.  10.Glipizide as previously.  11.Hydralazine 10 mg p.o. b.i.d.  12.Zocor 40 mg half-tablet p.o. nightly.  13.Lisinopril 40 mg 1-1/2 tablets daily.  14.Restoril 30 mg 1 p.o. nightly p.r.n.  15.Lasix 40 mg p.o. daily.  16.Prilosec 20 mg p.o. daily p.r.n.  17.Glucosamine as previously.  18.Doxycycline stopped as discussed above.   FOLLOW-UP CARE:  1. Dr. Leonides Sake in 1-2 weeks.  The patient to call for      appointment.  2. Dr. Allyson Sabal next week.  The patient to call for appointment.   DISCHARGE CONDITION:  Improved/stable.   DISCHARGE DIET:  Two gram sodium modified carbohydrate with 1400 mL  fluid restriction.      Kela Millin, M.D.  Electronically Signed     ACV/MEDQ  D:  08/03/2008  T:  08/03/2008  Job:  [id number]   cc:   Nanetta Batty, M.D.  Fax: [phone]   Holley Bouche, M.D.  Fax: [phone]

## 2010-08-08 NOTE — H&P (Signed)
NAMEDENSIL, OTTEY           ACCOUNT NO.:  [id number]   MEDICAL RECORD NO.:  [id number]          PATIENT TYPE:  EMS   LOCATION:  ED                           FACILITY:  [facility]   PHYSICIAN:  Ramiro Harvest, MD    DATE OF BIRTH:  March 16, 1933   DATE OF ADMISSION:  05/27/2008  DATE OF DISCHARGE:                              HISTORY & PHYSICAL   The patient's primary care physician is Dr. Tiburcio Pea of [facility].  The patient's  cardiologist is Dr. Allyson Sabal of [facility].   HISTORY OF PRESENT ILLNESS:  [name] is a 77 year old white  male with history of type 2 diabetes who was recently started on  increased dose of research  long acting insulin, history of coronary  artery disease status post CABG with EF of 40-45% per patient, history  of hypertension, hyperlipidemia, gastroesophageal reflux disease, who  presents to the ED with altered mental status and hypothermia.  According to the family, the patient  was last seen in his normal state  around 11:00 a.m.  The patient stated he went to the junk yard around  noon to look around for some bookcases.  The patient stated that while  walking outside looking for some book cases, he started to feel dizzy  and weak and then all he remembers is getting up in the mud on the  ground.  The patient thinks he likely blacked out.  The patient states  that he was very weak and dizzy and confused and shivering and as such  was unable to get up.  The family estimates that the patient was on the  ground for about 3-4 hours.  The people working at the junk yard found  the patient on the ground, took him inside, gave him some candy and  wrapped in some blankets.  His family was called.  the patient's  daughter-in-law went by and the patient was picked up and brought to the  emergency room.  Per family, the patient's long-acting research insulin  was recently increased 1 day prior to admission and 1 day prior to  admission the  patient seemed to be very weak and lethargic all da per  the patient's son.  The patient denies any fevers; no chest pain or  shortness of breath, no nausea or vomiting; no abdominal pain or  diarrhea.  No constipation or dysuria.  No melena or hematemesis.  No  hematochezia.  No focal neurological symptoms.  The patient was seen in  the ED and found to have a temperature of 90.0 rectally.  Head CT was  negative.  Chest x-ray was negative.  CBC showed a white count of 10.9  with a left shift.  UA was negative.  IStat 8 with a BUN of 37 and a  glucose of 124, otherwise was within normal limits.  EKG with a left  bundle branch block which is unchanged from prior EKG and heart rate in  the 40s.  The patient was placed on a bear hugger and we were called to  admit the patient for further evaluation and management.   ALLERGIES:  NO  KNOWN DRUG ALLERGIES.   PAST MEDICAL HISTORY:  1. Coronary artery disease status post CABG in 1994; an EF of 40-45%      per the patient done in January 2010.  2. Hypertension.  3. Hyperlipidemia.  4. Type 2 diabetes.  5. Gastroesophageal reflux disease.  6. A small hiatal hernia.  7. Asymptomatic severe progressive right internal carotid artery      stenosis status post right carotid endarterectomy done on April 28, 2007 per Dr. Liliane Bade.  8. Adjacent segment of spinal stenosis L2- L3 with a previous L3-L5      fusion for fracture per Dr. Otelia Sergeant done June 05, 2004 and  status      post lumbar laminectomy L2-L3 with chronic low back pain and      intermittent bilateral radiculopathy done on July 24, 2006 per Dr.      Noel Gerold.  9. History of cecal polyp status post polypectomy x 2 in October 2004      which was a complicated by a lower GI bleed and also another      polypectomy done in January 2005 per Dr. Madilyn Fireman.  10.History of spinal fusion secondary to a fall from a tree in 1984.  11.History of migraine headaches.  12.Hyperlipidemia.  13.History of  kidney stones.   HOME MEDICATIONS.:  1. Metformin 1000 mg p.o. q.a.m. and 1500 mg p.o. q.h.s.  2. Norvasc 10 mg p.o. q.a.m.  3. Folic acid 800 mcg  p.o. daily.  4. Lasix 40 mg p.o. daily.  5. Omeprazole 20 mg as needed.  6. Aspirin 81 mg p.o. daily.  7. Plavix 75 mg p.o. daily.  8. Multivitamin p.o. daily.  9. Coreg 25 mg p.o. b.i.d.  10.Diclofenac EC  75 mg b.i.d.  11.Glipizide 20 mg p.o. b.i.d.  12.Hydralazine 10 mg p.o. b.i.d..  13.Simvastatin 20 mg p.o. q.h.s.  14.Research long-acting insulin.  15.Lisinopril 60 mg p.o. daily.  16.Temazepam  30 mg p.o. q.h.s. p.r.n.[name]  17.Glucosamine ACL with MSM 1500 / 1500 1 tablet p.o. b.i.d.   SOCIAL HISTORY:  The patient is widowed, retired.  No tobacco use, quit  in 1960.  Rare alcohol use.  No IV drug use.   FAMILY HISTORY:  Mother deceased age 35 from gangrene.  Father deceased  at age 37 from pancreatic cancer.   REVIEW OF SYSTEMS:  As per HPI, otherwise negative.   PHYSICAL EXAMINATION:  Temperature 90.0 rectally, blood pressure of  126/55 down to 101/55, pulse of 48, respiratory rate 16, satting 99% on  room air.  GENERAL:  Patient lying on gurney in no apparent distress,  conversing with family and coherent.  HEENT: Normocephalic, atraumatic.  Pupils equal, round and reactive to  light and accommodation.  Extraocular movements intact.  Oropharynx  clear.  No lesions, no exudates.  NECK:  Supple.  No lymphadenopathy.  Dry mucous membranes.  RESPIRATORY:  Lungs are clear to auscultation bilaterally.  No wheezes  or crackles.  No rhonchi.  CARDIOVASCULAR:  Bradycardic regular rhythm.  No murmurs, rubs or  gallops.  ABDOMEN:  Soft, nontender, nondistended.  Positive bowel sounds.  EXTREMITIES:  No clubbing or cyanosis.  1+ bilateral lower extremity  edema.  NEUROLOGICAL:  The patient is alert and oriented x3.  Cranial nerves II-  XII are grossly intact.  No focal deficits.   ADMISSION LABS:  CBC:  White count 10.9, hemoglobin  of 12.4, hematocrit  of 36.9, platelets of 245, ANC of 9.1,  IStat 8:  Sodium 139, potassium  4.6, chloride 107, glucose 124, BUN 37, creatinine 1.5.  Urinalysis  is  yellow, clear, specific gravity 1.008, pH of 6.5, glucose negative,  bilirubin negative, ketones negative, blood negative, protein negative,  urobilinogen 0.2, nitrite negative, leukocytes negative.   Chest x-ray shows mild cardiomegaly, no acute findings.  Head CT shows  no acute intracranial abnormalities chronic small vessel ischemic change  and brain atrophy.  EKG shows left bundle branch block which is  unchanged from a prior EKG.   ASSESSMENT AND PLAN:  [name] is a 77 year old gentleman  with history of coronary artery disease status post coronary artery  bypass graft, history of type 2 diabetes on a research long-acting  insulin, history of hypertension, hyperlipidemia, carotid artery  stenosis status post carotid endarterectomy presenting to the ED with  altered mental status and hypothermia.   1. Hypothermia, likely secondary to laying out in the cold versus      sepsis versus hypoglycemia.  Chest x-ray was negative.  Urinalysis      was negative.  Will check a TSH.  Check a comprehensive metabolic      profile.  Check a lipase.  Cycle cardiac enzymes.  Admit to the      step-down unit.  Place on a bear hugger.  IV fluids gently,      supportive care and follow.  2. Syncope likely secondary to the hypoglycemic spell versus      cardiovascular versus neurological which is unlikely with a      negative head CT and unremarkable neurological exam versus      orthostasis.  Will cycle cardiac enzymes q.8 h x3.  Check a TSH.      Check a comprehensive metabolic profile.  Check orthostatics.      Monitor on telemetry.  Hydrate gently with IV fluids secondary to a      depressed ejection fraction and monitor.  3. Altered mental status likely secondary to syncope and hypothymia      and hypoglycemia.  See  problem number 1, 2 and 4.  4. Hypoglycemia secondary to recently increased research long-acting      insulin.  Will hold all oral hypoglycemics and insulin for now.      Check CBGs every 4 hours for the next 12 hours then a.c. and h.s.      and monitor.  5. Dehydration.  IV fluids.  6. Bradycardia, likely secondary to problem number, see  number 1.      Will cycle cardiac enzymes.  Check a TSH.  Check a comprehensive      metabolic profile.  Will hold beta blocker for now and follow.  7. Hyperlipidemia.  Continue home dose simvastatin.  8. Coronary artery disease status post coronary artery bypass graft,      stable.  Cycle cardiac enzymes.  Check a TSH.  Continue home dose      aspirin, Plavix and simvastatin.  We will hold his beta-blocker      secondary to bradycardia.  Will also hold hydralazine and Lasix      secondary to dehydration.  9. Hypertension.  Hold blood pressure medicines.  10.Type 2 diabetes.  Check a hemoglobin A1c, hold  oral hypoglycemics      and insulin for now.  See problem number 4.  11.Leukocytosis likely a reactive leukocytosis.  Urinalysis is      negative.  Chest x-ray is negative.  No need for antibiotics at  this time; will follow.  12.Gastroesophageal reflux disease.  Protonix.  13.Carotid artery stenosis status post right carotid endarterectomy.      Continue Plavix and aspirin.  14.History of spinal fusion.  15.History of lumbar surgery.  16.Chronic back pain.  Pain management.  17.Prophylaxis.  Protonix for GI prophylaxis.  Lovenox for DVT      prophylaxis.   It was a pleasant taking care of [name].      Ramiro Harvest, MD  Electronically Signed     DT/MEDQ  D:  05/27/2008  T:  05/27/2008  Job:  [id number]   cc:   [facility]   Nanetta Batty, M.D.  Fax: [phone]

## 2010-08-11 NOTE — Op Note (Signed)
NAMEBURK, HOCTOR           ACCOUNT NO.:  [id number]   MEDICAL RECORD NO.:  [id number]          PATIENT TYPE:  INP   LOCATION:  2899                         FACILITY:  [facility]   PHYSICIAN:  Sharolyn Douglas, M.D.        DATE OF BIRTH:  06-25-1932   DATE OF PROCEDURE:  07/24/2006  DATE OF DISCHARGE:                               OPERATIVE REPORT   DIAGNOSIS:  1. Recurrent lumbar spinal stenosis L2-L3.  2. Lumbar spondylosis at L2-L3 above previous anterior lumbar fusion      L3-L4 and L4-L5.  3. Chronic back and bilateral lower extremity pain, left greater than      right.   PROCEDURE:  1. Revision lumbar laminectomy L2-L3 with bilateral decompression of      the thecal sac and nerve roots.  2. L2-L3 posterior spinal fusion.  3. L2-L3 pedicle screw instrumentation using the Abbott spine system.  4. L2-L3 transforaminal lumbar interbody fusion with placement of 9 mm      PEEK cage.  5. Local autogenous bone graft supplemented with bone morphogenic      protein.   SURGEON:  Sharolyn Douglas, M.D.   ASSISTANT:  Aura Fey. Ireton, P.A.-C.   COMPLICATIONS:  None.   ESTIMATED BLOOD LOSS:  300 mL.   COMPLICATIONS:  None.   COUNTS:  Needle and sponge counts correct.   INDICATIONS:  The patient is a 77 year old male with persistent  recurrent back and bilateral lower extremity pain, left greater than  right.  He had a previous anterior lumbar fusion at L3-L4 and L4-L5 for  a fracture many years ago. He has developed severe adjacent segment  spondylosis at L2-L3.  He had a previous laminectomy at this level by  another surgeon with some short term improvement.  He now presents for  revision laminectomy and fusion across the superjacent L2-L3 segment.  The risks, benefits, and alternatives were reviewed.   DESCRIPTION OF PROCEDURE:  After informed consent, he was taken to the  operating room.  He underwent general endotracheal anesthesia without  difficulty and given prophylactic IV  antibiotics.  Neural monitoring was  established in the form of lower extremity EMGs and SSEPs.  He was  carefully positioned prone on the Wilson frame.  All bony prominences  were padded.  The face and eyes were protected at all times.  The back  was prepped and draped in the usual sterile fashion.  The previous  midline incision was utilized.  The dissection was carried through the  previous scar.  The bony elements were exposed out to the transverse  processes of L2 and L3.  Great care was taken not to inadvertently enter  into the laminectomy defect.  Intraoperative x-ray was taken to confirm  the levels.  A deep retractor was placed.   We then turned our attention to performing a revision laminectomy at L2-  L3. The edges were identified and debrided using curets, headlight  magnification, and loupes.  Once the edges of the laminectomy were  identified, the epidural fibrosis was removed using the Leksell rongeur  and pituitaries.  We then began the  process of widening the laminectomy.  We removed a good portion of the facet joints in order to allow for a  wide decompression.  The L3 nerve roots were identified and widely  decompressed.  Once we were satisfied with the decompression, we placed  pedicle screws at L2 and L3 using anatomic probing technique.  Each  pedicle starting point was identified anatomically.  The pedicles were  probed and palpated and there were no breeches.  We utilized 6.5 x 50 mm  screws.  The bone quality was average and the screw purchase was good.  We then stimulated each screw using triggered EMGs and there were no  deleterious changes.  Before placing the screws, we had decorticated the  transverse process of L2 and L3 in preparation for the arthrodesis.   At this point, we elected to complete a transforaminal lumbar fusion at  L2-L3 on the left side to further decompress the nerve roots and allow  for increased fusion rate.  The remaining facet joint  was osteotomized.  The exiting and transversing nerve roots were identified and protected  at all times.  The disc space was entered and dilated up to 9 mm.  We  found that the L3 nerve root had scarred down to the underlying mass and  this had to be mobilized.  Once entering the disc space, a radical  discectomy was completed, the cartilaginous endplates were scraped  clean.  The disc space was packed with BMP sponges along with local bone  graft obtained from the laminectomy.  A 9 mm PEEK cage was inserted into  the disc space, tamped anteriorly, and across the midline.  We then  completed the posterior spinal fusion by packing the remaining local  bone graft into the lateral gutters in L2 and L3.  We placed short  segment titanium rods in the polyaxial screw heads and compression was  applied before shearing off the locking caps.   Gelfoam was left over the exposed epidural space.  A deep Hemovac drain  was left.  Intraoperative x-ray was taken which showed good position of  the instrumentation at L2-L3.  The wound was closed in layers using #1  Vicryl suture on the deep layer, 2-0 Vicryl suture on the subcutaneous  layer followed by a running 3-0 subcuticular Vicryl suture on the skin  edges.  Dermabond was applied.  A sterile dressing was placed.  The  patient was turned supine, extubated without difficulty, and transferred  to recovery in stable condition neurologically intact.   It should be noted that my assistant, Orlin Hilding, P.A.-C. was present  throughout the procedure including the positioning, the exposure, the  decompression, the fusion and instrumentation, and she also assisted  with wound closure.      Sharolyn Douglas, M.D.  Electronically Signed     MC/MEDQ  D:  07/24/2006  T:  07/24/2006  Job:  [id number]

## 2010-08-11 NOTE — Op Note (Signed)
   NAME:  [name], [name]                   ACCOUNT NO.:  [id number]   MEDICAL RECORD NO.:  [id number]                   PATIENT TYPE:  AMB   LOCATION:  ENDO                                 FACILITY:  [facility]   PHYSICIAN:  John C. Madilyn Fireman, M.D.                 DATE OF BIRTH:  03-04-1933   DATE OF PROCEDURE:  01/08/2003  DATE OF DISCHARGE:                                 OPERATIVE REPORT   PROCEDURE:  Colonoscopy with polypectomy.   INDICATIONS FOR PROCEDURE:  Change in bowel habits with chronic mild  diarrhea in a 77 year old patient whose also had some left lower quadrant  abdominal pain and no prior colon screening.   DESCRIPTION OF PROCEDURE:  The patient was placed in the left lateral  decubitus position then placed on the pulse monitor with continuous low flow  oxygen delivered by nasal cannula. He was sedated with 50 mcg IV fentanyl  and 5 mg IV Versed. The Olympus video colonoscope was inserted into the  rectum and advanced to the cecum, confirmed by transillumination at  McBurney's point and visualization of the ileocecal valve and appendiceal  orifice. The prep was excellent.  In the cecum, there was seen a  multilobulated approximately 2 x 1 1/2 cm polyp that was somewhat vaguely  delineated from the surrounding mucosa. The majority of this lesion was  ensnared and removed in one piece, remaining small residual elements around  the main polypectomy site were fulgurated with the closed hot biopsy  forceps. However, it is probable that not all the polyp is destroyed. The  remainder of the cecum, ascending, transverse, and descending colon appeared  normal with no further masses, polyps, diverticula or other mucosal  abnormalities. In the sigmoid colon, there were several scattered  diverticula, no other abnormalities. The rectum appeared normal. Biopsies  were taken to rule out the microscopic colitis.  The scope was then  withdrawn and the patient returned to the recovery  room in stable condition.  The patient tolerated the procedure well and there were no immediate  complications.   IMPRESSION:  Moderately large cecal polyp probably not completely removed.   PLAN:  Await biopsies to rule out malignancy and also await rectal biopsies.  Will probably recommend a repeat colonoscopy for destruction of any  remaining polyps in 2-3 months.                                               John C. Madilyn Fireman, M.D.    JCH/MEDQ  D:  01/08/2003  T:  01/08/2003  Job:  [id number]   cc:   Holley Bouche, M.D.  [address]  [place], [place] [address]  Fax: [phone]

## 2010-08-11 NOTE — Op Note (Signed)
NAME:  [name], [name]                     ACCOUNT NO.:  [id number]   MEDICAL RECORD NO.:  [id number]                   PATIENT TYPE:  AMB   LOCATION:  ENDO                                 FACILITY:  [facility]   PHYSICIAN:  John C. Madilyn Fireman, M.D.                 DATE OF BIRTH:  1932-12-14   DATE OF PROCEDURE:  04/03/2003  DATE OF DISCHARGE:                                 OPERATIVE REPORT   PROCEDURE:  Esophagogastroduodenoscopy.   INDICATIONS:  The patient with history of gastroesophageal reflux who has  had increased sensation of choking or closing off of his throat,  particularly at night.  He was also undergoing colonoscopy today for known  residual cecal polyp.   DESCRIPTION OF PROCEDURE:  The patient was placed in the left lateral  decubitus position and placed on the pulse monitor with continuous low flow  oxygen delivered by nasal cannula.  He was sedated with 62.5 mcg fentanyl  and 5 mg IV Versed.  The video endoscope was advanced under direct vision  into the oropharynx and esophagus.  The esophagus was straight and of normal  caliber, the squamocolumnar line at 38 cm. There was a minimal hiatal hernia  with no abnormality seen in the esophagus.  Specifically no visible  esophagitis, ring, stricture, web or Zenker's diverticula.  The stomach was  entered and a small amount of liquid secretions were suctioned from the  fundus.  Retroflexed view of the cardia was unremarkable.  The fundus, body,  antrum and pylorus all appeared normal.  The duodenum was entered.  Both  bulb and second portion were well inspected and appeared to be within normal  limits.  The scope was then withdrawn and the patient returned to the  recovery room in stable condition.  He tolerated the procedure well and  there were no immediate complications.   IMPRESSION:  Small hiatal hernia noticed on this study.   PLAN:  Continue proton pump inhibitors and reinforce dietary and lifestyle  modifications for  reflux, particularly not eating close to bedtime and  elevating the head of the bed.                                               John C. Madilyn Fireman, M.D.    JCH/MEDQ  D:  04/05/2003  T:  04/05/2003  Job:  [id number]   cc:   Holley Bouche, M.D.  [address]  [place], [place] [address]  Fax: [phone]

## 2010-08-11 NOTE — Op Note (Signed)
[name], POOLE           ACCOUNT NO.:  [id number]   MEDICAL RECORD NO.:  [id number]          PATIENT TYPE:  INP   LOCATION:  2875                         FACILITY:  [facility]   PHYSICIAN:  Kerrin Champagne, M.D.   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   PROCEDURES:  1.  Central decompressive laminectomy of L2-3 with excision of spinous      process and central portions of the lamina of L2.  2.  Bilateral L2 and L3 nerve root decompressions.   SURGEON:  Kerrin Champagne, M.D.   ASSISTANT:  Wende Neighbors, P.A.   ANESTHESIA:  GOT by Maren Beach, M.D.   ESTIMATED BLOOD LOSS:  50 mL.   DRAINS:  None.   COMPLICATIONS:  None.   BRIEF CLINICAL HISTORY:  The patient is a 77 year old male, status post  previous central anterior diskectomy and fusion of the L3-4 and L4-5 levels  in 1983.  He has been increasing discomfort with standing ambulation pain  primarily in the left L3 distribution.  He has undergone preoperative  evaluation studies indicating spinal stenosis, which was judged to be severe  at the L2-3 level above the previous fusion site, L3 to sacrum.  The patient  is brought to the operating room to undergo a decompressive laminectomy with  decompression of bilateral L2 and bilateral L3 nerve roots.   INTRAOPERATIVE FINDINGS:  The patient was found to have lateral recess  stenosis primarily bilaterally at the L2-3 segment effecting the L3 nerve  roots.  He underwent decompressive laminectomy centrally, decompression of  bilateral lateral recesses and both L2 and L3 nerve root utilizing loop  magnification and then microscope at the end of the procedure.   DESCRIPTION OF  PROCEDURE:  After adequate general anesthesia, the patient  was transferred to the operating room table on the Wilson frame.  All  pressure points were well padded.  Standard preoperative antibiotics.  Standard prep with Duraprep solution from the lower dorsal spine to the  sacrum.  Draped in the usual manner.  Initial spinal needles were placed at  the expected L2-3 level.  Intraoperative lateral radiograph identified the  upper needle at the L2-3 disk space posteriorly and posterior to the lamina.  An incision was then made at this level and extended upwards to include the  upper portion of the spinous process and lamina of L2.  An incision  approximately 3.5-4 cm in length through the skin and subcutaneous layers  was carried down to the lumbodorsal fascia.  Subcutaneous layers were  infiltrated with Marcaine 0.5% with 1:200,000 epinephrine.  Intraoperative  radiograph obtained with clamp on the spinous process of L2.  Identifying  this level, this was marked with Bovie electrocautery for continued  identification throughout the case.  A self-retaining retractor was placed.  Lumbar fascia incised at both sides of the spinous process of L2, the lower  30% of the process of L1 and the spinous process of L3.  Cobbs used to  elevated the paralumbar muscles off of the posterior aspect of the spinous  process of L2 bilaterally, exposing the lateral aspects of the L2 lamina,  the interspinous and interlaminar regions posteriorly, as well as L2-3  interlaminar regions bilaterally.  There was quite a bit of overlap of the  L2-3 lamina and shingling effect present.  McCullough retractor and Bose  retractor were inserted with the blades then holding back paralumbar muscles  on both sides.   A Leksell rongeur was then used to excise the spinous process of L2,  removing a small portion of the superior aspect of the spinous process of L3  and about 20% of the inferior aspect of the spinous process of  L1.  The  spinous process was carried down to the central portion of the lamina of L3  and this was thinned using a Leksell rongeur.  A 1/2-inch osteotome straight  was then used to performed medial facetectomy involving the inferior  articular process of L2 bilaterally, bringing up the interlaminar articular  process of L2 bilaterally, bringing up the interlaminar region.  The high-  speed bur was then used to carefully further thin the posterior aspect of  the lamina of L2 from the interlaminar space at the L1-2 level down to the  interlaminar space at L2-3.  When the lamina was sufficiently thinned, the  osteotome was then used to osteotomize the remaining aspect of the lamina  and the lamina was able to be lifted off with a 3 mm Kerrison.  The  remaining portions of the superior aspect of the lamina were resected using  3 mm Kerrison.  Loop magnification and a headlamp were used during the  portion of the procedure.  The ligamentum flavum at the L1-2 level was then  resected centrally at both sides.  Then the ligamentum flavum at the L2-3  level was resected where there was judged to be a great deal of hypertrophic  flavum causing pinching on both sides of the lateral recesses to the near  central portions of the spinal canal.  With the ligamentum flavum resected  bilaterally, the foraminotomy was performed over both L3 nerve roots  resecting a portion of the superior aspect of the lamina of L3 bilaterally  over the L3 nerve roots.  The operating room microscope was then draped and  brought into the field under sterile prep and drape.  Then the lateral  recesses were further decompressed, removing the hypertrophic ligamentum  flavum over the medial aspect of facet at L2-3 bilaterally until the L3  nerve root was completely well decompressed within the lateral recess out to the level o f the L3 pedicle.  A hockey nerve probe could then be passed out  the neural foramen over the L3 nerve  roots bilaterally without any sign of  impediment.  The lateral recess was easily observed to be well decompressed.  No further flavum remaining that could be causing compression.  The L2 nerve  root was noted to be exiting without impingement bilaterally at the L2  neural foramen.  The upper aspect of the laminotomy appeared to be well  decompressed as well.  Irrigation was carried.  Bone wax applied to the  bleeding cancellus bone surfaces on both sides.  Gelfoam placed within the  lateral gutters.  Then  Gelfoam was removed and hemostasis appeared to be  complete.  Irrigation was performed.  Soft tissue released from retraction.  Small bleeders controlled using electrocautery.  Then 1-0 Vicryl suture used  to loosely approximate the paralumbar muscles in the midline as there was no  further bleeding evident.  The lumbodorsal fascia was approximated in the  midline with interrupted 1-0 Vicryl suture.  The deep subcutaneous layers  were approximated with interrupted 0 Vicryl sutures, the more superficial  layers with interrupted 2-0 Vicryl sutures and the skin closed with a  running subcutaneous stitch of 4-0 Vicryl.  Tincture of Benzoin and Steri-  Strips applied.  Then 4 x 4s affixed to the skin with Hypafix tape.  The  patient was then returned to a supine position, reactivated, extubated and  returned to the recovery room in satisfactory condition.  All instrument and  sponge counts were correct.      JEN/MEDQ  D:  06/22/2004  T:  06/22/2004  Job:  [id number]

## 2010-08-11 NOTE — Discharge Summary (Signed)
[name], [name]           ACCOUNT NO.:  [id number]   MEDICAL RECORD NO.:  [id number]          PATIENT TYPE:  INP   LOCATION:  5039                         FACILITY:  [facility]   PHYSICIAN:  Sharolyn Douglas, M.D.        DATE OF BIRTH:  13-Dec-1932   DATE OF ADMISSION:  07/24/2006  DATE OF DISCHARGE:  07/27/2006                               DISCHARGE SUMMARY   ADMITTING DIAGNOSES:  Adjacent segment spinal stenosis L2-3 above  previous L3-L5 fusion for fracture, status post lumbar laminectomy L2-3  with chronic low back pain and intermittent bilateral radiculopathy.   DISCHARGE DIAGNOSIS:  Adjacent segment spinal stenosis L2-3 above  previous L3-L5 fusion for fracture, status post lumbar laminectomy L2-3  with chronic low back pain and intermittent bilateral radiculopathy.   OPERATION/PROCEDURE:  Revision L2-3 lumbar laminectomy, posterior spinal  fusion L2-3 with Pedicle screws, T-lift and Allograft bone graft, BMP.   CONSULTATIONS:  None.   BRIEF HISTORY:  Patient is a 76 year old white male with a several year  history of low back pain secondary to fracture and trauma in the past.  He status post fusion L3-L5 and status post lumbar laminectomy by Dr.  Otelia Sergeant at L2-3.  He now has adjacent segment L2-3 degenerative disc  disease, spinal stenosis and back pain.  He elected to proceed with a  revision L2-3 lumbar laminectomy and posterior spinal fusion.   HOSPITAL COURSE:  Patient underwent posterior spinal fusion on July 24, 2006.  Postoperatively, he tolerated procedure well, was placed on the  floor, was placed on PCA and morphine pump for pain control.  DVT  prophylaxis with SCDs and TED hose.  He is restarted on his home  medications.  Hemovac drain was placed.  Discharge planning was  arranged.  He was placed n.p.o. until flatus.  First day,  postoperatively, he was doing well.  His hemoglobin was 10.4, hematocrit  of 30.8.  BMET within normal limits.  Potassium was 4.6,  sodium 140.  All other lab values can be obtained from permanent hospital record.  He  was tolerating clear liquids.  Vital signs were stable.  He was  afebrile.  He was working with physical therapy to get out of bed with  LSO in place for ambulation.  Second day postoperatively, Hemovac drain  was discontinued.  Dressing was discontinued from his back.  Foley  catheter was discontinued and he continued to progress well.  Continued  on his home medications.  He is ready for discharge home on Jul 27, 2006,  eating well, voiding well, ambulating well with LSO in place with no  complaints.   DISCHARGE CONDITION:  Stable and improved.   DISCHARGE INSTRUCTIONS:  Discontinue IV prior to discharge home.  Discharge home Jul 27, 2006.  Given prescription for Vicodin 1 p.o. q.4-6  hours p.r.n. pain, Robaxin 500 mg 1 p.o. q.8-12 hours p.r.n. pain.  He  is to take Colace 100 mg b.i.d., calcium 500 mg b.i.d. and a  multivitamin daily.   He is also to continue on his home medications, which include:  1. Plavix 75 mg p.o. daily.  2. Aspirin 81 mg p.o. daily.  3. Folic acid 400 mg p.o. daily.  4. Simvastatin 20 mg p.o. daily.  5. Amlodipine 10 mg p.o. daily.  6. Lisinopril 60 mg p.o. daily.  7. Hydralazine 20 mg p.o. b.i.d.  8. Metoprolol 100 mg p.o. b.i.d.  9. Hydrochlorothiazide 25 mg p.o. daily.  10.Metformin 500 mg p.o. t.i.d.  11._______ 250 mg p.o. daily.  12.Diclofenac 150 mg p.o. b.i.d.  13.He is to hold on the Voltaren at home.  14.Temazepam 30 mg p.o. p.r.n.  15.Glucosamine 1500 mg p.o. daily.  16.Omeprazole 20 mg p.o. daily.   He is to return for followup in our office in 2 weeks.  Contact our  office prior to follow up with any questions or concerns, [phone].  No  lifting greater than 5 pounds.  No bending, stooping, squatting or  twisting.  He is to wear his LSO for ambulation.  He may shower.      Aura Fey Bobbe Medico.      Sharolyn Douglas, M.D.  Electronically Signed     SCI/MEDQ  D:  07/27/2006  T:  07/27/2006  Job:  [id number]

## 2010-08-11 NOTE — Op Note (Signed)
NAME:  [name], [name]           ACCOUNT NO.:  [id number]   MEDICAL RECORD NO.:  [id number]          PATIENT TYPE:  AMB   LOCATION:  SDS                          FACILITY:  [facility]   PHYSICIAN:  Vonna Kotyk R. Jacinto Halim, MD       DATE OF BIRTH:  [birth year]   DATE OF PROCEDURE:  08/23/2005  DATE OF DISCHARGE:                                 OPERATIVE REPORT   ATTENDING CARDIOLOGIST:  Nanetta Batty, M.D.   PROCEDURE:  1.  Arch aortogram.  2.  Selective extracranial and intracranial cerebral four vessel      arteriogram.   INDICATIONS FOR PROCEDURE:  [name] is a 77 year old gentleman  with a history of coronary artery disease with ejection fraction of 40%.  He  had significant bruit on auscultation of his right coronary artery.  There  was Doppler evidence of high grade stenosis of the carotid artery.  Given  this, he was brought to the catheterization suite for definitive  quantification of his carotid stenosis and to evaluate the anatomy for  feasibility of surgical versus percutaneous revascularization strategy.   ANGIOGRAPHIC DATA:  1.  Arch aortogram.  Arch aortogram revealed type 2 arch.  It was smooth and      no significant calcification was noted.  The left vertebral artery was      arising anonymously directly from the aortic arch.  2.  Right carotid artery.  The right carotid artery was smooth and normal.      The external carotid artery showed an eccentric 50-60% stenosis.  The      internal carotid artery at the bifurcation showed a small ulceration      followed by an 80% heavy plaque burden stenosis.  Otherwise, the vessel      appeared to be smooth and normal.  3.  Right vertebral artery.  The right vertebral artery was not selectively      cannulated because it was very small and nondominant.  4.  Right subclavian artery.  The right subclavian artery revealed a 40-50%      stenosis of the right subclavian artery just after its origin.  5.  Left carotid artery.   The left common carotid artery was smooth and      normal.  The left external carotid artery showed a 70-80% luminal      stenosis.  Otherwise the left internal carotid artery was smooth and      normal.  6.  Left vertebral artery.  The left vertebral artery was a large size      vessel and a dominant vessel.  It was smooth and normal.  The left      vertebral artery arose anomously directly from the arch of the aorta.  7.  Intracerebral arteriogram.  Overall, the intracerebral circulation      appeared to be intact.  There was competitive filling of the anterior      cerebral circulation from the left carotid arterial system.  The left      vertebral artery was a dominant vessel and constituted the basilar      artery  and retrograde filled the right vertebral system.   A total of 120 mL of contrast was utilized for diagnostic angiography.   RECOMMENDATIONS:  Based on the anatomy, the patient will be evaluated for  carotid stent trial with capture II versus carotid endarterectomy.  These  issues will be discussed with the patient in the upcoming office visit with  Nanetta Batty, M.D.   DESCRIPTION OF PROCEDURE:  Under usual sterile precautions using a 5 French  right femoral artery access, a 5 French pigtail catheter was advanced into  the arch of the aorta and arch aortogram was performed in the LAO projection  injecting 30 mL of contrast.  Then the catheter was pulled out of the body  in the usual fashion.  Using a JP1 5 [place] diagnostic catheter, the  catheter was advanced over the arch of the aorta over a 0.035 inch Wooley  wire.  Right common carotid artery was selectively cannulated and  angiography was performed.  Then the same catheter was utilized to engage  the left common carotid artery and angiography was performed.   The left vertebral artery arose directly from the arch of the aorta and this  was selectively cannulated.  Angiography was performed and then the  catheter  was pulled out of the body in the usual fashion.  The patient tolerated the  procedure well.      Cristy Hilts. Jacinto Halim, MD  Electronically Signed     JRG/MEDQ  D:  08/23/2005  T:  08/23/2005  Job:  [id number]   cc:   Nanetta Batty, M.D.  Fax: [phone]   Melida Quitter, M.D.  Fax: [phone]

## 2010-08-11 NOTE — Discharge Summary (Signed)
   NAME:  [name], [name]                   ACCOUNT NO.:  [id number]   MEDICAL RECORD NO.:  [id number]                   PATIENT TYPE:  INP   LOCATION:  0362                                 FACILITY:  [facility]   PHYSICIAN:  James L. Malon Kindle., M.D.          DATE OF BIRTH:  Mar 25, 1933   DATE OF ADMISSION:  01/12/2003  DATE OF DISCHARGE:  01/14/2003                                 DISCHARGE SUMMARY   ADMISSION DIAGNOSIS:  Acute lower gastrointestinal bleed due to recent  polypectomy.   FINAL DIAGNOSIS:  Acute lower gastrointestinal bleed due to recent  polypectomy.   HOSPITAL COURSE:  The patient had a colonoscopy by Dr. Dorena Cookey four days  prior to admission, had a polyp removed from the cecum.  Apparently, this  was a fairly large polyp.  He did fine initially.  The night before his  admission, just about 3-1/2 days after the polypectomy, he awoke with a  crampy pain and passed bloody bowel movements.  He was admitted, placed on  IV fluids.  He was given laxatives to clean out his colon.  His prothrombin  time was slightly elevated at 15, he was given some oral vitamin K.  He was  maintained on clear liquids.  The following day his hemoglobin was stable,  he was still having slight bloody bowel movements.  Hemoglobin was initially  9.9, it did drop transiently to 7.7, but apparently there was some question  that might have been above his IV site at the time we did not know that.  He  did receive a single unit of blood based on that number that increased his  hemoglobin to 11.  In retrospect, it may have been a fictitiously low  hemoglobin.  He was started back on all of his previous medicines.  He was  doing quite well and his stool cleared up.  He was discharged home on all of  his admission medications with instructions to follow up with Dr. Madilyn Fireman a  couple weeks in the office.  He will come into our office two days after his  discharge for a CBC.  Call with any signs of  further bleeding.                                               James L. Malon Kindle., M.D.    Waldron Session  D:  01/28/2003  T:  01/28/2003  Job:  [id number]   cc:   Holley Bouche, M.D.  [address]  [place], [place] [address]  Fax: [phone]   Jamison Neighbor, M.D.  [address]  [place]  [place] [address]  Fax: [phone]   Nanetta Batty, M.D.  [phone] [address]  [place]  [place] [address]  Fax: [phone]

## 2010-08-11 NOTE — Op Note (Signed)
NAME:  [name], [name]                     ACCOUNT NO.:  [id number]   MEDICAL RECORD NO.:  [id number]                   PATIENT TYPE:  AMB   LOCATION:  ENDO                                 FACILITY:  [facility]   PHYSICIAN:  John C. Madilyn Fireman, M.D.                 DATE OF BIRTH:  [birth year]   DATE OF PROCEDURE:  04/05/2003  DATE OF DISCHARGE:                                 OPERATIVE REPORT   PROCEDURE:  Colonoscopy and polypectomy.   INDICATIONS FOR PROCEDURE:  Large cecal polyps seen on previous colonoscopy  two months ago, not completely destroyed.  The procedure is to try to  fulgurate the residual polyp.   DESCRIPTION OF PROCEDURE:  The patient was placed in the left lateral  decubitus position and placed on the pulse monitor with continuous low-flow  oxygen delivered by nasal cannula.  He was sedated with 62.5 mcg IV fentanyl  and 5 mg IV Versed for the previous EGD and no further sedation was required  for this procedure.  The Olympus video colonoscope was inserted into the  rectum and advanced to the cecum, confirmed by transillumination of  McBurney's point and visualization of the ileocecal valve and appendiceal  orifice.  Prep was excellent.  In the distal cecum, opposite the ileocecal  valve, there was residual polyp, approximately 1 cm x 7.5 mm in diameter  with somewhat of an irregular base and no stalk.  The majority of this  lesion was removed with the snare and the remaining residual elements were  fulgurated with closed hot biopsy forceps.  The remainder of the cecum,  ascending, transverse and descending appeared normal with no further masses,  polyps, diverticula, or other mucosal abnormalities.  In the sigmoid colon  there were seen several scattered diverticula.  There were no other  abnormalities.  The colonoscope was then withdrawn and the patient returned  to the recovery room in stable condition.  He tolerated the procedure well  and there were no immediate  complications.   IMPRESSION:  Residual cecal polyp fulgurated by snare biopsy.   PLAN:  Await histology and repeat colonoscopy in three years assuming no  malignancy.                                               John C. Madilyn Fireman, M.D.    JCH/MEDQ  D:  04/05/2003  T:  04/05/2003  Job:  [id number]   cc:   Holley Bouche, M.D.  [address]  [place], [place] [address]  Fax: [phone]

## 2010-08-11 NOTE — H&P (Signed)
NAME:  [name], [name]                   ACCOUNT NO.:  [id number]   MEDICAL RECORD NO.:  [id number]                   PATIENT TYPE:  INP   LOCATION:  0154                                 FACILITY:  [facility]   PHYSICIAN:  James L. Malon Kindle., M.D.          DATE OF BIRTH:  [birth year]   DATE OF ADMISSION:  01/12/2003  DATE OF DISCHARGE:                                HISTORY & PHYSICAL   HISTORY:  This is a 77 year old gentleman who underwent a colonoscopy by Dr.  Dorena Cookey, done for change in bowel movements.  This was performed four  days prior to admission.  At that time a polyp was removed from the cecum.  From the handwritten note I am unable to tell exactly how large it was; it  just indicated a polyp was removed.  Also, biopsies were obtained for  microscopic colitis.  The patient went home, did fine.  Had no fever or pain  and normal bowel movements from Friday to last night.  He did resume his  aspirin on Sunday, as was instructed, two days following the procedure.  Last night at approximately 2 a.m. he awakened with crampy abdominal pain,  thought he was having diarrhea, went to the bathroom, had a bloody bowel  movement.  He felt weak and dizzy.  Apparently had a couple of bowel  movements and passed out in the bathroom.  He denies hitting his head.  His  wife said he came to immediately and got back on the toilet.  He has had a  total of three bloody bowel movements since he woke up.  Since his admission  to the hospital he has not passed any more bloody stool.  He was brought by  EMS.  Evaluation by the ER doctor showed no evidence of any trauma to his  head, and he was awake and alert and has been so throughout his time here in  the emergency room.  His initial hemoglobin was 9.9.  We do not know exactly  what his baseline is.   MEDICATIONS ON ADMISSION:  1. Aspirin 325 mg daily.  2. Glucophage 1000 mg daily.  3. Lopressor 25 mg b.i.d.  4. Valproic sodium 2  tablets daily.  5. Lisinopril 20 mg daily.  6. Omeprazole 20 mg daily.  7. Glipizide 20 mg b.i.d.  8. Zocor 20 mg q.h.s.  9. Folic acid and vitamins.   He has no drug allergies.   MEDICAL HISTORY:  1. History of coronary disease.  He has had previous coronary bypass surgery     and is followed by Dr. Nanetta Batty.  Apparently, his heart is doing     well.  2. Hypertension.  3. Non-insulin-dependent diabetes.  4. He has had a history of spinal fusion surgery done through an intra-     abdominal approach.   FAMILY HISTORY:  Father died of pancreatic cancer.   SOCIAL HISTORY:  He is married.  He  is with his wife and daughter today.  His daughter is a Engineer, civil (consulting) here at [facility] [employer] [facility].  He does not smoke or  drink.   REVIEW OF SYSTEMS:  Primarily as mentioned in present illness.  He was  totally asymptomatic until early this morning.   PHYSICAL EXAM:  VITAL SIGNS:  Temperature 97.2, blood pressure 113/59, pulse  66.  GENERAL:  Totally alert and oriented white male in no acute distress.  He is  laughing and joking.  HEENT:  Head:  No signs of head trauma.  Pupils equal and reactive to light.  Extraocular movements intact.  Throat:  Mucous membranes are moist.  NECK:  Supple.  No lymphadenopathy.  LUNGS:  Clear anteriorly and posteriorly.  HEART:  Regular rate and rhythm.  Without murmurs or gallops.  ABDOMEN:  Soft, nondistended, and totally nontender, with excellent bowel  sounds.  There is a large longitudinal scar on the left side of his abdomen  going nearly from the xiphoid to his pubis from his previous spinal fusion  surgery.   ASSESSMENT:  Acute lower gastrointestinal bleed, almost certainly due to his  polypectomy site.  He has been stable now for several hours and, hopefully,  will not require any intervention.   PLAN:  Will admit to the hospital, initially to the ICU.  Will make certain  that his IV fluids maintain his blood pressure.  Will check serial   hemoglobins.  Will get him some vitamin K since his protime is slightly  elevated and go ahead and keep him on clear liquids with some MiraLax in  case he needs interventional procedure.                                               James L. Malon Kindle., M.D.    Waldron Session  D:  01/12/2003  T:  01/12/2003  Job:  [id number]   cc:   Holley Bouche, M.D.  [address]  [place], [place] [address]  Fax: [phone]   Jamison Neighbor, M.D.  [address]  [place]  [place] [address]  Fax: [phone]   Everardo All. Madilyn Fireman, M.D.  [address]  [place]  [place] [address]  Fax: [phone]   Nanetta Batty, M.D.  [phone]  [place]  [place] [address]  Fax: [phone]

## 2010-11-21 ENCOUNTER — Ambulatory Visit: Payer: Medicare Other | Attending: Specialist | Admitting: Physical Therapy

## 2010-11-21 DIAGNOSIS — M25519 Pain in unspecified shoulder: Secondary | ICD-10-CM | POA: Insufficient documentation

## 2010-11-21 DIAGNOSIS — IMO0001 Reserved for inherently not codable concepts without codable children: Secondary | ICD-10-CM | POA: Insufficient documentation

## 2010-11-21 DIAGNOSIS — M25619 Stiffness of unspecified shoulder, not elsewhere classified: Secondary | ICD-10-CM | POA: Insufficient documentation

## 2010-12-01 ENCOUNTER — Ambulatory Visit: Payer: Medicare Other | Attending: Specialist | Admitting: Physical Therapy

## 2010-12-01 DIAGNOSIS — M25519 Pain in unspecified shoulder: Secondary | ICD-10-CM | POA: Insufficient documentation

## 2010-12-01 DIAGNOSIS — IMO0001 Reserved for inherently not codable concepts without codable children: Secondary | ICD-10-CM | POA: Insufficient documentation

## 2010-12-01 DIAGNOSIS — M25619 Stiffness of unspecified shoulder, not elsewhere classified: Secondary | ICD-10-CM | POA: Insufficient documentation

## 2010-12-04 ENCOUNTER — Ambulatory Visit: Payer: Medicare Other | Admitting: Physical Therapy

## 2010-12-08 ENCOUNTER — Ambulatory Visit: Payer: Medicare Other | Admitting: Physical Therapy

## 2010-12-12 ENCOUNTER — Ambulatory Visit: Payer: Medicare Other | Admitting: Physical Therapy

## 2010-12-14 LAB — COMPREHENSIVE METABOLIC PANEL
ALT: 16
Alkaline Phosphatase: 54
Chloride: 100
Glucose, Bld: 188 — ABNORMAL HIGH
Potassium: 4.8
Sodium: 134 — ABNORMAL LOW
Total Protein: 6.9

## 2010-12-14 LAB — APTT: aPTT: 30

## 2010-12-14 LAB — URINALYSIS, ROUTINE W REFLEX MICROSCOPIC
Bilirubin Urine: NEGATIVE
Glucose, UA: 100 — AB
Hgb urine dipstick: NEGATIVE
Ketones, ur: NEGATIVE
Nitrite: NEGATIVE
pH: 5

## 2010-12-14 LAB — CBC
Hemoglobin: 13
RBC: 4.17 — ABNORMAL LOW
RDW: 12.6
WBC: 7.1

## 2010-12-14 LAB — ABO/RH: ABO/RH(D): O POS

## 2010-12-14 LAB — TYPE AND SCREEN

## 2010-12-15 ENCOUNTER — Ambulatory Visit: Payer: Medicare Other | Admitting: Physical Therapy

## 2010-12-15 LAB — CBC
HCT: 30.5 — ABNORMAL LOW
Platelets: 215
Platelets: 248
RDW: 12.6
RDW: 12.9
WBC: 5.9
WBC: 9.4

## 2010-12-15 LAB — URINALYSIS, ROUTINE W REFLEX MICROSCOPIC
Bilirubin Urine: NEGATIVE
Ketones, ur: NEGATIVE
Nitrite: NEGATIVE
Specific Gravity, Urine: 1.012
Urobilinogen, UA: 0.2
pH: 5.5

## 2010-12-15 LAB — BLOOD GAS, ARTERIAL
Drawn by: 181601
FIO2: 0.21
Patient temperature: 98.6
pCO2 arterial: 40.2
pH, Arterial: 7.394

## 2010-12-15 LAB — BASIC METABOLIC PANEL
BUN: 29 — ABNORMAL HIGH
Calcium: 8.8
GFR calc non Af Amer: >60
Glucose, Bld: 192 — ABNORMAL HIGH
Potassium: 3.9

## 2010-12-15 LAB — COMPREHENSIVE METABOLIC PANEL
AST: 18
Albumin: 3.8
Alkaline Phosphatase: 46
Chloride: 108
GFR calc Af Amer: >60
Potassium: 4.4
Total Bilirubin: 0.7
Total Protein: 6.8

## 2010-12-15 LAB — PROTIME-INR: INR: 1.1

## 2010-12-15 LAB — APTT: aPTT: 28

## 2010-12-15 LAB — TYPE AND SCREEN: Antibody Screen: NEGATIVE

## 2010-12-19 ENCOUNTER — Ambulatory Visit: Payer: Medicare Other | Admitting: Physical Therapy

## 2010-12-22 ENCOUNTER — Ambulatory Visit: Payer: Medicare Other | Admitting: Physical Therapy

## 2010-12-26 ENCOUNTER — Ambulatory Visit: Payer: Medicare Other | Attending: Specialist | Admitting: Physical Therapy

## 2010-12-26 DIAGNOSIS — IMO0001 Reserved for inherently not codable concepts without codable children: Secondary | ICD-10-CM | POA: Insufficient documentation

## 2010-12-26 DIAGNOSIS — M25519 Pain in unspecified shoulder: Secondary | ICD-10-CM | POA: Insufficient documentation

## 2010-12-26 DIAGNOSIS — M25619 Stiffness of unspecified shoulder, not elsewhere classified: Secondary | ICD-10-CM | POA: Insufficient documentation

## 2010-12-29 ENCOUNTER — Ambulatory Visit: Payer: Medicare Other | Admitting: Physical Therapy

## 2011-01-01 ENCOUNTER — Ambulatory Visit: Payer: Medicare Other | Admitting: Physical Therapy

## 2011-01-05 ENCOUNTER — Ambulatory Visit: Payer: Medicare Other | Admitting: Rehabilitation

## 2011-02-23 ENCOUNTER — Ambulatory Visit
Admission: RE | Admit: 2011-02-23 | Discharge: 2011-02-23 | Disposition: A | Discharge status: Home / Self Care | Payer: Medicare Other | Source: Ambulatory Visit | Attending: Cardiovascular Disease | Admitting: Cardiovascular Disease

## 2011-02-23 ENCOUNTER — Other Ambulatory Visit: Payer: Self-pay | Admitting: Cardiovascular Disease

## 2011-02-26 ENCOUNTER — Encounter (HOSPITAL_COMMUNITY): Payer: Self-pay | Admitting: Pharmacy Technician

## 2011-02-27 ENCOUNTER — Encounter (HOSPITAL_COMMUNITY): Payer: Self-pay | Admitting: Cardiology

## 2011-02-27 ENCOUNTER — Inpatient Hospital Stay (HOSPITAL_COMMUNITY)
Admission: RE | Admit: 2011-02-27 | Discharge: 2011-03-05 | DRG: 287 | Disposition: A | Discharge status: Home / Self Care | Payer: Medicare Other | Source: Ambulatory Visit | Attending: Cardiovascular Disease | Admitting: Cardiovascular Disease

## 2011-02-27 DIAGNOSIS — I447 Left bundle-branch block, unspecified: Secondary | ICD-10-CM | POA: Diagnosis present

## 2011-02-27 DIAGNOSIS — I129 Hypertensive chronic kidney disease with stage 1 through stage 4 chronic kidney disease, or unspecified chronic kidney disease: Secondary | ICD-10-CM | POA: Diagnosis present

## 2011-02-27 DIAGNOSIS — I255 Ischemic cardiomyopathy: Secondary | ICD-10-CM | POA: Diagnosis present

## 2011-02-27 DIAGNOSIS — I2510 Atherosclerotic heart disease of native coronary artery without angina pectoris: Secondary | ICD-10-CM | POA: Diagnosis present

## 2011-02-27 DIAGNOSIS — E785 Hyperlipidemia, unspecified: Secondary | ICD-10-CM

## 2011-02-27 DIAGNOSIS — Z9861 Coronary angioplasty status: Secondary | ICD-10-CM

## 2011-02-27 DIAGNOSIS — E119 Type 2 diabetes mellitus without complications: Secondary | ICD-10-CM | POA: Diagnosis present

## 2011-02-27 DIAGNOSIS — Y840 Cardiac catheterization as the cause of abnormal reaction of the patient, or of later complication, without mention of misadventure at the time of the procedure: Secondary | ICD-10-CM | POA: Diagnosis not present

## 2011-02-27 DIAGNOSIS — Z7982 Long term (current) use of aspirin: Secondary | ICD-10-CM

## 2011-02-27 DIAGNOSIS — I2582 Chronic total occlusion of coronary artery: Secondary | ICD-10-CM | POA: Diagnosis present

## 2011-02-27 DIAGNOSIS — Y9210 Unspecified residential institution as the place of occurrence of the external cause: Secondary | ICD-10-CM | POA: Diagnosis not present

## 2011-02-27 DIAGNOSIS — I208 Other forms of angina pectoris: Secondary | ICD-10-CM

## 2011-02-27 DIAGNOSIS — I519 Heart disease, unspecified: Secondary | ICD-10-CM

## 2011-02-27 DIAGNOSIS — IMO0002 Reserved for concepts with insufficient information to code with codable children: Secondary | ICD-10-CM | POA: Diagnosis not present

## 2011-02-27 DIAGNOSIS — I200 Unstable angina: Secondary | ICD-10-CM | POA: Diagnosis present

## 2011-02-27 DIAGNOSIS — Z7902 Long term (current) use of antithrombotics/antiplatelets: Secondary | ICD-10-CM

## 2011-02-27 DIAGNOSIS — Z79899 Other long term (current) drug therapy: Secondary | ICD-10-CM

## 2011-02-27 DIAGNOSIS — I25810 Atherosclerosis of coronary artery bypass graft(s) without angina pectoris: Principal | ICD-10-CM | POA: Diagnosis present

## 2011-02-27 DIAGNOSIS — N183 Chronic kidney disease, stage 3 unspecified: Secondary | ICD-10-CM | POA: Diagnosis present

## 2011-02-27 HISTORY — DX: Left bundle-branch block, unspecified: I44.7

## 2011-02-27 HISTORY — DX: Unspecified osteoarthritis, unspecified site: M19.90

## 2011-02-27 HISTORY — DX: Hyperlipidemia, unspecified: E78.5

## 2011-02-27 HISTORY — DX: Other forms of angina pectoris: I20.8

## 2011-02-27 HISTORY — DX: Atherosclerotic heart disease of native coronary artery without angina pectoris: I25.10

## 2011-02-27 HISTORY — DX: Heart disease, unspecified: I51.9

## 2011-02-27 HISTORY — DX: Gastro-esophageal reflux disease without esophagitis: K21.9

## 2011-02-27 HISTORY — DX: Heart failure, unspecified: I50.9

## 2011-02-27 HISTORY — DX: Encounter for other specified aftercare: Z51.89

## 2011-02-27 HISTORY — DX: Chronic kidney disease, stage 3 (moderate): N18.3

## 2011-02-27 HISTORY — DX: Acute myocardial infarction, unspecified: I21.9

## 2011-02-27 HISTORY — DX: Chronic kidney disease, stage 3 unspecified: N18.30

## 2011-02-27 HISTORY — DX: Essential (primary) hypertension: I10

## 2011-02-27 LAB — BASIC METABOLIC PANEL
CO2: 24 mEq/L (ref 19–32)
Calcium: 8.7 mg/dL (ref 8.4–10.5)
Glucose, Bld: 211 mg/dL — ABNORMAL HIGH (ref 70–99)
Sodium: 139 mEq/L (ref 135–145)

## 2011-02-27 LAB — GLUCOSE, CAPILLARY: Glucose-Capillary: 233 mg/dL — ABNORMAL HIGH (ref 70–99)

## 2011-02-27 MED ORDER — SODIUM CHLORIDE 0.9 % IV SOLN
INTRAVENOUS | Status: DC
  Administered 2011-02-27: 17:00:00 via INTRAVENOUS

## 2011-02-27 MED ORDER — ACETAMINOPHEN 650 MG RE SUPP: 650.0000 mg | Freq: Four times a day (QID) | RECTAL | Status: DC | PRN

## 2011-02-27 MED ORDER — CLOPIDOGREL BISULFATE 75 MG PO TABS
75.0000 mg | ORAL_TABLET | Freq: Every day | ORAL | Status: DC
  Administered 2011-02-28 – 2011-03-02 (×3): 75 mg via ORAL
  Filled 2011-02-27 (×4): qty 1

## 2011-02-27 MED ORDER — SIMVASTATIN 20 MG PO TABS
20.0000 mg | ORAL_TABLET | Freq: Every day | ORAL | Status: DC
  Administered 2011-02-27 – 2011-03-04 (×5): 20 mg via ORAL
  Filled 2011-02-27 (×7): qty 1

## 2011-02-27 MED ORDER — HYDRALAZINE HCL 25 MG PO TABS
25.0000 mg | ORAL_TABLET | Freq: Two times a day (BID) | ORAL | Status: DC
  Administered 2011-02-27 – 2011-03-05 (×12): 25 mg via ORAL
  Filled 2011-02-27 (×15): qty 1

## 2011-02-27 MED ORDER — ACETAMINOPHEN 325 MG PO TABS: 650.0000 mg | ORAL_TABLET | Freq: Four times a day (QID) | ORAL | Status: DC | PRN

## 2011-02-27 MED ORDER — DIAZEPAM 5 MG PO TABS: 5.0000 mg | ORAL_TABLET | ORAL | Status: AC

## 2011-02-27 MED ORDER — GLIPIZIDE 10 MG PO TABS
10.0000 mg | ORAL_TABLET | Freq: Two times a day (BID) | ORAL | Status: DC
  Administered 2011-02-27 – 2011-03-01 (×6): 10 mg via ORAL
  Filled 2011-02-27 (×9): qty 1

## 2011-02-27 MED ORDER — ONDANSETRON HCL 4 MG PO TABS: 4.0000 mg | ORAL_TABLET | Freq: Four times a day (QID) | ORAL | Status: DC | PRN

## 2011-02-27 MED ORDER — PANTOPRAZOLE SODIUM 40 MG PO TBEC: 40.0000 mg | DELAYED_RELEASE_TABLET | Freq: Every day | ORAL | Status: DC

## 2011-02-27 MED ORDER — ALUM & MAG HYDROXIDE-SIMETH 200-200-20 MG/5ML PO SUSP: 30.0000 mL | Freq: Four times a day (QID) | ORAL | Status: DC | PRN

## 2011-02-27 MED ORDER — ZOLPIDEM TARTRATE 5 MG PO TABS
5.0000 mg | ORAL_TABLET | Freq: Every evening | ORAL | Status: DC | PRN
  Administered 2011-02-27: 5 mg via ORAL
  Filled 2011-02-27: qty 1

## 2011-02-27 MED ORDER — SODIUM CHLORIDE 0.9 % IV SOLN
INTRAVENOUS | Status: DC
  Administered 2011-02-27: 08:00:00 via INTRAVENOUS

## 2011-02-27 MED ORDER — SODIUM CHLORIDE 0.9 % IJ SOLN: 3.0000 mL | INTRAMUSCULAR | Status: DC | PRN

## 2011-02-27 MED ORDER — AMLODIPINE BESYLATE 10 MG PO TABS
10.0000 mg | ORAL_TABLET | Freq: Every day | ORAL | Status: DC
  Administered 2011-02-28 – 2011-03-05 (×6): 10 mg via ORAL
  Filled 2011-02-27 (×7): qty 1

## 2011-02-27 MED ORDER — HEPARIN SODIUM (PORCINE) 5000 UNIT/ML IJ SOLN
5000.0000 [IU] | Freq: Three times a day (TID) | INTRAMUSCULAR | Status: AC
  Administered 2011-02-27 (×2): 5000 [IU] via SUBCUTANEOUS
  Filled 2011-02-27 (×3): qty 1

## 2011-02-27 MED ORDER — ONDANSETRON HCL 4 MG/2ML IJ SOLN: 4.0000 mg | Freq: Four times a day (QID) | INTRAMUSCULAR | Status: DC | PRN

## 2011-02-27 MED ORDER — ASPIRIN EC 81 MG PO TBEC
81.0000 mg | DELAYED_RELEASE_TABLET | Freq: Every day | ORAL | Status: DC
  Administered 2011-02-27 – 2011-03-01 (×3): 81 mg via ORAL
  Filled 2011-02-27 (×4): qty 1

## 2011-02-27 MED ORDER — THERA M PLUS PO TABS
1.0000 | ORAL_TABLET | Freq: Every day | ORAL | Status: DC
  Administered 2011-02-28 – 2011-03-05 (×6): 1 via ORAL
  Filled 2011-02-27 (×7): qty 1

## 2011-02-27 MED ORDER — PANTOPRAZOLE SODIUM 40 MG PO TBEC
40.0000 mg | DELAYED_RELEASE_TABLET | Freq: Every day | ORAL | Status: DC
  Administered 2011-02-28 – 2011-03-05 (×6): 40 mg via ORAL
  Filled 2011-02-27 (×5): qty 1

## 2011-02-27 MED ORDER — DIAZEPAM 5 MG PO TABS: 5.0000 mg | ORAL_TABLET | ORAL | Status: DC

## 2011-02-27 MED ORDER — CARVEDILOL 25 MG PO TABS
25.0000 mg | ORAL_TABLET | Freq: Two times a day (BID) | ORAL | Status: DC
  Administered 2011-02-27 – 2011-03-05 (×11): 25 mg via ORAL
  Filled 2011-02-27 (×15): qty 1

## 2011-02-27 NOTE — H&P (Signed)
Please see Dr. Hazle Coca complete history and physical that he dictated in the office.  Brief addendum to H&P  77 year old, white, married male presented to the short stay Center at [facility] 02/27/2011 for elective cardiac catheterization. His creatinine in the office had been 1.98 and he typically runs 1.3.repeat creatinine at [facility] was 1.82. Dr. Allyson Sabal felt he should be admitted as observation and hydrate him prior to his cardiac catheterization. He is being admitted to telemetry bed.  His history includes coronary artery bypass grafting in 1994 and repeat cardiac catheterization in 2009 which revealed patent grafts except for a 50-60% smooth stenosis in the distal portion of the PDA, PLA sequential vein graft. Other history includes hypertension dyslipidemia diabetes mellitus type 2 non-insulin-dependent and chronic kidney disease. Additionally in 2009 EF was 30-35% but on 2 D echo, done 2010, EF it had improved to 40-45%.  Patient has been having increase in discomfort in his chest that wakes him from sleep. Dr. Allyson Sabal was concerned that this rest angina may be secondary to increased stenosis within the distal portion of the PDA, PLA vein graft. After hydration he will proceed with cardiac catheterization 02/28/2011 with Dr. Tresa Endo.  Patient and his wife have been instructed on these issues and they are agreeable to change in plan.

## 2011-02-27 NOTE — Progress Notes (Signed)
Reported to [place], Charity fundraiser; pt. Transferred to [id number] via wc, no distress or complaints.

## 2011-02-28 ENCOUNTER — Encounter (HOSPITAL_COMMUNITY)
Admission: RE | Disposition: A | Discharge status: Home / Self Care | Payer: Self-pay | Source: Ambulatory Visit | Attending: Cardiovascular Disease

## 2011-02-28 HISTORY — PX: LEFT HEART CATHETERIZATION WITH CORONARY/GRAFT ANGIOGRAM: SHX5450

## 2011-02-28 LAB — BASIC METABOLIC PANEL
BUN: 30 mg/dL — ABNORMAL HIGH (ref 6–23)
CO2: 25 mEq/L (ref 19–32)
Calcium: 8.7 mg/dL (ref 8.4–10.5)
Chloride: 105 mEq/L (ref 96–112)
Creatinine, Ser: 1.41 mg/dL — ABNORMAL HIGH (ref 0.50–1.35)
Glucose, Bld: 172 mg/dL — ABNORMAL HIGH (ref 70–99)

## 2011-02-28 LAB — CBC
HCT: 28.9 % — ABNORMAL LOW (ref 39.0–52.0)
MCH: 30.9 pg (ref 26.0–34.0)
MCV: 92 fL (ref 78.0–100.0)
RDW: 12.7 % (ref 11.5–15.5)
WBC: 5.1 10*3/uL (ref 4.0–10.5)

## 2011-02-28 LAB — GLUCOSE, CAPILLARY

## 2011-02-28 LAB — PROTIME-INR: INR: 1.18 (ref 0.00–1.49)

## 2011-02-28 SURGERY — LEFT HEART CATHETERIZATION WITH CORONARY/GRAFT ANGIOGRAM
Anesthesia: LOCAL

## 2011-02-28 MED ORDER — HEPARIN (PORCINE) IN NACL 2-0.9 UNIT/ML-% IJ SOLN
INTRAMUSCULAR | Status: AC
  Filled 2011-02-28: qty 2000

## 2011-02-28 MED ORDER — ASPIRIN 81 MG PO CHEW
CHEWABLE_TABLET | ORAL | Status: AC
  Filled 2011-02-28: qty 1

## 2011-02-28 MED ORDER — HEPARIN SOD (PORCINE) IN D5W 100 UNIT/ML IV SOLN
1300.0000 [IU]/h | INTRAVENOUS | Status: DC
  Administered 2011-02-28: 1300 [IU]/h via INTRAVENOUS
  Filled 2011-02-28 (×2): qty 250

## 2011-02-28 MED ORDER — SODIUM CHLORIDE 0.9 % IV SOLN
INTRAVENOUS | Status: DC
  Administered 2011-02-28 (×2): via INTRAVENOUS

## 2011-02-28 MED ORDER — LIDOCAINE HCL (PF) 1 % IJ SOLN
INTRAMUSCULAR | Status: AC
  Filled 2011-02-28: qty 30

## 2011-02-28 MED ORDER — FENTANYL CITRATE 0.05 MG/ML IJ SOLN
INTRAMUSCULAR | Status: AC
  Filled 2011-02-28: qty 2

## 2011-02-28 MED ORDER — ISOSORBIDE MONONITRATE ER 60 MG PO TB24
60.0000 mg | ORAL_TABLET | Freq: Every day | ORAL | Status: DC
  Administered 2011-02-28 – 2011-03-05 (×6): 60 mg via ORAL
  Filled 2011-02-28 (×6): qty 1

## 2011-02-28 MED ORDER — ACETAMINOPHEN 325 MG PO TABS: 650.0000 mg | ORAL_TABLET | ORAL | Status: DC | PRN

## 2011-02-28 MED ORDER — ASPIRIN 81 MG PO CHEW: 81.0000 mg | CHEWABLE_TABLET | Freq: Every day | ORAL | Status: DC

## 2011-02-28 MED ORDER — TEMAZEPAM 15 MG PO CAPS: 30.0000 mg | ORAL_CAPSULE | Freq: Every evening | ORAL | Status: DC | PRN

## 2011-02-28 MED ORDER — NITROGLYCERIN 0.2 MG/ML ON CALL CATH LAB
INTRAVENOUS | Status: AC
  Filled 2011-02-28: qty 1

## 2011-02-28 MED ORDER — ONDANSETRON HCL 4 MG/2ML IJ SOLN: 4.0000 mg | Freq: Four times a day (QID) | INTRAMUSCULAR | Status: DC | PRN

## 2011-02-28 MED ORDER — ZOLPIDEM TARTRATE 5 MG PO TABS: 5.0000 mg | ORAL_TABLET | Freq: Every evening | ORAL | Status: DC | PRN

## 2011-02-28 MED ORDER — CLOPIDOGREL BISULFATE 75 MG PO TABS: 75.0000 mg | ORAL_TABLET | Freq: Every day | ORAL | Status: DC

## 2011-02-28 MED ORDER — MIDAZOLAM HCL 2 MG/2ML IJ SOLN
INTRAMUSCULAR | Status: AC
  Filled 2011-02-28: qty 2

## 2011-02-28 NOTE — Progress Notes (Signed)
Subjective:  No chest pain  Objective:  Vital Signs in the last 24 hours: Temp:  [97.2 F (36.2 C)-98.2 F (36.8 C)] 97.9 F (36.6 C) (12/05 0700) Pulse Rate:  [60-65] 61  (12/05 0700) Resp:  [18-20] 20  (12/05 0700) BP: (139-160)/(58-68) 157/68 mmHg (12/05 0700) SpO2:  [94 %-96 %] 94 % (12/05 0700) Weight:  [91.3 kg (201 lb 4.5 oz)-91.8 kg (202 lb 6.1 oz)] 201 lb 4.5 oz (91.3 kg) (12/05 0700)  Intake/Output from previous day: 12/04 0701 - 12/05 0700 In: 240 [P.O.:240] Out: 1825 [Urine:1825]  Physical Exam: General appearance: alert, cooperative and no distress No jvd Lungs: clear to auscultation bilaterally Heart: regular rate and rhythm, S1, S2 normal, no murmur, click, rub or gallop abd soft, bs pos. No cce  Rate: 60  Rhythm NSR  Lab Results:  Basename 02/28/11 0533  WBC 5.1  HGB 9.7*  PLT 238    Basename 02/28/11 0533 02/27/11 0751  NA 139 139  K 3.6 4.0  CL 105 106  CO2 25 24  GLUCOSE 172* 211*  BUN 30* 38*  CREATININE 1.41* 1.82*   No results found for this basename: TROPONINI:2,CK,MB:2 in the last 72 hours Hepatic Function Panel No results found for this basename: PROT,ALBUMIN,AST,ALT,ALKPHOS,BILITOT,BILIDIR,IBILI in the last 72 hours No results found for this basename: CHOL in the last 72 hours  Basename 02/28/11 0533  INR 1.18    Imaging: No results found.  Cardiac Studies:  Assessment/Plan:   Principal Problem:  *CKD (chronic kidney disease) stage 3, GFR 30-59 ml/min Active Problems:  Angina at rest  CAD (coronary artery disease)  LV dysfunction  LBBB (left bundle branch block)  Hyperlipemia  Diabetes mellitus   Plan-cath today   [name] PA-C 02/28/2011, 10:02 AM       Patient seen and examined. Agree with assessment and plan. Cr improved with hydration.  Plan cath this am.  Discussed with pt and wife.   Lennette Bihari, MD, [facility] 02/28/2011 10:08 AM

## 2011-02-28 NOTE — Progress Notes (Signed)
Trudee Kuster RN Clinical Documentation Specialist Worked with case care management to confirm the patient should have inpatient status since admission per EHR.  Dr. Tresa Endo notified, inpatient documented on post cath orders

## 2011-02-28 NOTE — Brief Op Note (Signed)
Cardiac Catherization  [name], 77 y.o., male Note dicatated.  DICTATION # [id number], [id number]  Will hydrate post cath. Plan tentative PCI to native LCX in 2 days.  Lennette Bihari, MD, [facility] 02/28/2011 12:11 PM

## 2011-02-28 NOTE — Progress Notes (Signed)
ANTICOAGULATION CONSULT NOTE - Initial Consult  Pharmacy Consult for heparin Indication: pt post cath, will have PCI in 2 days  Allergies  Allergen Reactions  . Lisinopril Cough    Patient Measurements: Height: 5\' 11"  (180.3 cm) Weight: 201 lb 4.5 oz (91.3 kg) (scale (B)) IBW/kg (Calculated) : 75.3  Adjusted Body Weight:   Vital Signs: Temp: 98.3 F (36.8 C) (12/05 2133) Temp src: Oral (12/05 1345) BP: 124/52 mmHg (12/05 2133) Pulse Rate: 61  (12/05 2133)  Labs:  Basename 02/28/11 0533 02/27/11 0751  HGB 9.7* --  HCT 28.9* --  PLT 238 --  APTT 32 --  LABPROT 15.3* --  INR 1.18 --  HEPARINUNFRC -- --  CREATININE 1.41* 1.82*  CKTOTAL -- --  CKMB -- --  TROPONINI -- --   Estimated Creatinine Clearance: 51.5 ml/min (by C-G formula based on Cr of 1.41).  Medical History: Past Medical History  Diagnosis Date  . Angina at rest 02/27/2011  . CAD (coronary artery disease) 02/27/2011  . LV dysfunction 02/27/2011  . CKD (chronic kidney disease) stage 3, GFR 30-59 ml/min 02/27/2011  . LBBB (left bundle branch block)   . LBBB (left bundle branch block) 02/27/2011  . Hypertension   . Hyperlipemia 02/27/2011  . Diabetes mellitus 02/27/2011  . GERD (gastroesophageal reflux disease)   . CHF (congestive heart failure)   . Blood transfusion     no reaction to transfusion per patient  . Arthritis   . Myocardial infarction     Medications:  Scheduled:    . amLODipine  10 mg Oral Daily  . aspirin      . aspirin EC  81 mg Oral QHS  . carvedilol  25 mg Oral BID WC  . clopidogrel  75 mg Oral Daily  . diazepam  5 mg Oral On Call  . fentaNYL      . glipiZIDE  10 mg Oral BID  . heparin      . hydrALAZINE  25 mg Oral BID  . isosorbide mononitrate  60 mg Oral Daily  . lidocaine      . midazolam      . multivitamins ther. w/minerals  1 tablet Oral Daily  . nitroGLYCERIN      . pantoprazole  40 mg Oral Q1200  . simvastatin  20 mg Oral q1800  . DISCONTD: aspirin  81 mg Oral  Daily  . DISCONTD: clopidogrel  75 mg Oral Q breakfast    Assessment: 77 yr old arrived 12/4 for cath and was found to have elevated S.Cr.  Cath was delayed one day for hydration. After cath on 12/5, pt to be anticoagulated with heparin for 2 days until PCI. Goal of Therapy:  Heparin level 0.3-0.7 units/ml   Plan:  Pt post cath and to start heparin 8 hrs after sheath pull. Will start at 1300 units/hr and no bolus. Will f/u heparin level and CBC in AM and adjust as needed. Pt for PCI in 2 days.  [name] 02/28/2011,11:45 PM

## 2011-03-01 ENCOUNTER — Other Ambulatory Visit: Payer: Self-pay

## 2011-03-01 LAB — GLUCOSE, CAPILLARY
Glucose-Capillary: 188 mg/dL — ABNORMAL HIGH (ref 70–99)
Glucose-Capillary: 273 mg/dL — ABNORMAL HIGH (ref 70–99)

## 2011-03-01 LAB — CBC
HCT: 27.2 % — ABNORMAL LOW (ref 39.0–52.0)
Hemoglobin: 9 g/dL — ABNORMAL LOW (ref 13.0–17.0)
MCV: 92.5 fL (ref 78.0–100.0)
RDW: 12.6 % (ref 11.5–15.5)
WBC: 5.4 10*3/uL (ref 4.0–10.5)

## 2011-03-01 LAB — HEPARIN LEVEL (UNFRACTIONATED): Heparin Unfractionated: 0.58 IU/mL (ref 0.30–0.70)

## 2011-03-01 MED ORDER — SODIUM CHLORIDE 0.9 % IV SOLN: 250.0000 mL | INTRAVENOUS | Status: DC | PRN

## 2011-03-01 MED ORDER — SODIUM CHLORIDE 0.9 % IJ SOLN: 3.0000 mL | INTRAMUSCULAR | Status: DC | PRN

## 2011-03-01 MED ORDER — INSULIN ASPART 100 UNIT/ML ~~LOC~~ SOLN
0.0000 [IU] | SUBCUTANEOUS | Status: DC
  Administered 2011-03-02: 2 [IU] via SUBCUTANEOUS
  Administered 2011-03-03: 1 [IU] via SUBCUTANEOUS
  Administered 2011-03-03: 3 [IU] via SUBCUTANEOUS
  Administered 2011-03-03 (×2): 2 [IU] via SUBCUTANEOUS
  Administered 2011-03-04: 3 [IU] via SUBCUTANEOUS
  Administered 2011-03-04: 2 [IU] via SUBCUTANEOUS
  Administered 2011-03-04: 1 [IU] via SUBCUTANEOUS
  Administered 2011-03-04 – 2011-03-05 (×2): 2 [IU] via SUBCUTANEOUS
  Filled 2011-03-01 (×3): qty 3

## 2011-03-01 MED ORDER — TEMAZEPAM 15 MG PO CAPS
30.0000 mg | ORAL_CAPSULE | Freq: Every day | ORAL | Status: DC
  Administered 2011-03-01 – 2011-03-04 (×4): 30 mg via ORAL
  Filled 2011-03-01: qty 2
  Filled 2011-03-01: qty 1
  Filled 2011-03-01 (×3): qty 2

## 2011-03-01 MED ORDER — SODIUM CHLORIDE 0.9 % IJ SOLN
3.0000 mL | Freq: Two times a day (BID) | INTRAMUSCULAR | Status: DC
  Administered 2011-03-01 – 2011-03-05 (×7): 3 mL via INTRAVENOUS

## 2011-03-01 MED ORDER — SODIUM CHLORIDE 0.9 % IV SOLN
1.0000 mL/kg/h | INTRAVENOUS | Status: DC
  Administered 2011-03-01 – 2011-03-02 (×2): 1 mL/kg/h via INTRAVENOUS

## 2011-03-01 MED ORDER — ASPIRIN 81 MG PO CHEW
324.0000 mg | CHEWABLE_TABLET | ORAL | Status: AC
  Administered 2011-03-02: 324 mg via ORAL
  Filled 2011-03-01: qty 4

## 2011-03-01 MED ORDER — HEPARIN SOD (PORCINE) IN D5W 100 UNIT/ML IV SOLN
1500.0000 [IU]/h | INTRAVENOUS | Status: DC
  Administered 2011-03-01 – 2011-03-02 (×2): 1500 [IU]/h via INTRAVENOUS
  Filled 2011-03-01 (×3): qty 250

## 2011-03-01 NOTE — Progress Notes (Signed)
ANTICOAGULATION CONSULT NOTE - Follow Up Consult  Pharmacy Consult for Heparin Indication: CAD, pending PCI  Allergies  Allergen Reactions  . Lisinopril Cough    Patient Measurements: Height: 5\' 11"  (180.3 cm) Weight: 205 lb 4 oz (93.1 kg) (scale (b)) IBW/kg (Calculated) : 75.3   Vital Signs: Temp: 98 F (36.7 C) (12/06 0505) BP: 136/62 mmHg (12/06 0505) Pulse Rate: 61  (12/06 0505)  Labs:  Basename 03/01/11 0620 02/28/11 0533 02/27/11 0751  HGB 9.0* 9.7* --  HCT 27.2* 28.9* --  PLT 228 238 --  APTT -- 32 --  LABPROT -- 15.3* --  INR -- 1.18 --  HEPARINUNFRC 0.21* -- --  CREATININE -- 1.41* 1.82*  CKTOTAL -- -- --  CKMB -- -- --  TROPONINI -- -- --   Estimated Creatinine Clearance: 51.9 ml/min (by C-G formula based on Cr of 1.41).   Medications:  Infusions:    . sodium chloride 50 mL/hr at 02/27/11 1718  . sodium chloride 125 mL/hr at 02/28/11 2129  . heparin 1,300 Units/hr (02/28/11 2009)    Assessment: CAD, awaiting PCI: Currently being anticoagulated with Heparin.  Heparin level is subtherapeutic.  Goal of Therapy:  Heparin level 0.3-0.7 units/ml   Plan:  Increase Heparin to 1500 units/hr. Recheck Heparin level in 8 hours.  Estella Husk, Pharm.D., BCPS Clinical Pharmacist  Pager [phone] 03/01/2011, 9:19 AM

## 2011-03-01 NOTE — Clinical Documentation Improvement (Signed)
HEART FAILURE DOCUMENTATION CLARIFICATION QUERY  THIS DOCUMENT IS NOT A PERMANENT PART OF THE MEDICAL RECORD  Please update your documentation within the medical record to reflect your response to this query.                                                                                    03/01/11  Dr Osvaldo Shipper and/or Associates,  In a better effort to capture your patient's severity of illness, reflect appropriate length of stay and utilization of resources, a review of the patient medical record has revealed the following indicators regarding Heart Failure.    Based on your clinical judgment, please document in the progress notes and discharge summary if a condition below provides greater specificity regarding the patient's heart function:  - Chronic Systolic Heart Failure  - Other Condition  - Unable to Clinically Determine   Clinical Information:   LV Gram Cardiac Cath 02/28/11 "Moderate LV dysfunction with an ejection fraction of 35-40% with anterolateral and hypocontractility as well as basal inferior hypocontractility."  Medications prior to admission included: Coreg Lasix Cozaar   Reviewed: Query never responded to by md or pa. Mathis Dad RN.  Thank You,  Jerral Ralph RN BSN Certified Clinical Documentation Specialist: Cell   [phone]  [facility] [employer] [facility] [employer]  In responding to this query please exercise your independent judgment.  The fact that a query is asked, does not imply that any particular answer is desired or expected.   TO RESPOND TO THE THIS QUERY, FOLLOW THE INSTRUCTIONS BELOW:  1. If needed, update documentation for the patient's encounter via the notes activity.  2. Access this query again and click edit on the [employer].  3. After updating, or not, click F2 to complete all highlighted (required) fields concerning your review. Select "additional documentation in the medical record" OR "no additional  documentation provided".  4. Click Sign note button.  5. The deficiency will fall out of your InBasket *Please let us know if you are not able to compete this workflow by phone or e-mail (listed below).

## 2011-03-01 NOTE — Progress Notes (Signed)
Subjective:  No chest pain overnight.  Objective:  Vital Signs in the last 24 hours: Temp:  [98 F (36.7 C)-98.6 F (37 C)] 98 F (36.7 C) (12/06 0505) Pulse Rate:  [60-62] 61  (12/06 0505) Resp:  [18-20] 20  (12/06 0505) BP: (124-152)/(52-83) 136/62 mmHg (12/06 0505) SpO2:  [93 %-97 %] 93 % (12/06 0505) Weight:  [93.1 kg (205 lb 4 oz)] 205 lb 4 oz (93.1 kg) (12/06 0505)  Intake/Output from previous day:  Intake/Output Summary (Last 24 hours) at 03/01/11 0957 Last data filed at 02/28/11 1900  Gross per 24 hour  Intake    940 ml  Output    700 ml  Net    240 ml    Physical Exam: Chest clear Cardiac: RRR without murmur Extre: Rt groin without hematoma or echymosis   Rate: 60  Rhythm: normal sinus rhythm  Lab Results:  Basename 03/01/11 0620 02/28/11 0533  WBC 5.4 5.1  HGB 9.0* 9.7*  PLT 228 238    Basename 02/28/11 0533 02/27/11 0751  NA 139 139  K 3.6 4.0  CL 105 106  CO2 25 24  GLUCOSE 172* 211*  BUN 30* 38*  CREATININE 1.41* 1.82*   No results found for this basename: TROPONINI:2,CK,MB:2 in the last 72 hours Hepatic Function Panel No results found for this basename: PROT,ALBUMIN,AST,ALT,ALKPHOS,BILITOT,BILIDIR,IBILI in the last 72 hours No results found for this basename: CHOL in the last 72 hours  Basename 02/28/11 0533  INR 1.18    Imaging: No results found.  Cardiac Studies:  Assessment/Plan:   Principal Problem:  *CKD (chronic kidney disease) stage 3, GFR 30-59 ml/min  Active Problems:  Angina at rest, cath yesterday showed progression of CAD  CAD (coronary artery disease)  LV dysfunction  LBBB (left bundle branch block)  Hyperlipemia  Diabetes mellitus   Plan- PCI in am per pt. Check BMP in am, (not ordered today). OK to shower.   [name] PA-C 03/01/2011, 9:57 AM    Agree with above Disc. PCI with Pt. & wife. BMP in am( [place])

## 2011-03-01 NOTE — Progress Notes (Signed)
  ANTICOAGULATION CONSULT NOTE - Follow Up Consult  Pharmacy Consult for Heparin Indication: CAD, for PCI on 12/7  Allergies  Allergen Reactions  . Lisinopril Cough    Patient Measurements: Height: 5\' 11"  (180.3 cm) Weight: 205 lb 4 oz (93.1 kg) (scale (b)) IBW/kg (Calculated) : 75.3    Vital Signs: Temp: 97.1 F (36.2 C) (12/06 1436) Temp src: Oral (12/06 1436) BP: 142/62 mmHg (12/06 1612) Pulse Rate: 64  (12/06 1612)  Labs:  Basename 03/01/11 2045 03/01/11 0620 02/28/11 0533 02/27/11 0751  HGB -- 9.0* 9.7* --  HCT -- 27.2* 28.9* --  PLT -- 228 238 --  APTT -- -- 32 --  LABPROT -- -- 15.3* --  INR -- -- 1.18 --  HEPARINUNFRC 0.58 0.21* -- --  CREATININE -- -- 1.41* 1.82*  CKTOTAL -- -- -- --  CKMB -- -- -- --  TROPONINI -- -- -- --   Estimated Creatinine Clearance: 51.9 ml/min (by C-G formula based on Cr of 1.41).   Goal of Therapy:  Heparin level 0.3-0.7   Assessment/Plan:  77yo male with progressing CAD, for PCI on Friday.  Heparin level now therapeutic on 1500 units/hr.  No bleeding problems noted.  Will continue current rate & f/u in AM.  Teirra Carapia P 03/01/2011,9:40 PM

## 2011-03-01 NOTE — Cardiovascular Report (Signed)
NAMENAVON, KOTOWSKI           ACCOUNT NO.:  [id number]  MEDICAL RECORD NO.:  [id number]  LOCATION:  4733                         FACILITY:  [facility]  PHYSICIAN:  Nicki Guadalajara, M.D.     DATE OF BIRTH:  [birth year]  DATE OF PROCEDURE:  02/28/2011 DATE OF DISCHARGE:                           CARDIAC CATHETERIZATION   Cardiac Catheterization Report  INDICATIONS:  [name] is a 77 year old gentleman who has established coronary artery disease.  He underwent CBG surgery in 1994 with a LIMA to the LAD, vein to the diagonal, vein to the marginal, and sequential vein to the PDA and PLA vessel.  His last cardiac catheterizations was in January 2009, showed an EF of approximately 30- 35%.  He did have 50-60% narrowing in the body of the sequential graft before the PDA.  At that time, other grafts were patent.  He does have a history of left bundle-branch block, hypertension, hyperlipidemia, as well as diabetes mellitus.  He has peripheral vascular disease and is status post right carotid endarterectomy.  He does have renal insufficiency.  He was admitted yesterday by Dr. Allyson Sabal for cardiac catheterization.  However, his creatinine was elevated at 1.8.  The patient was hydrated.  Repeat creatinine today was 1.41.  He now presents for cardiac catheterizations.  PROCEDURE:  After premedication with Versed 1 mg plus fentanyl 25 mcg, the patient prepped and draped in usual fashion.  His right femoral artery is punctured anteriorly and a 5-French sheath was inserted. Diagnostic catheterization was done utilizing 5-French Judkins 4 left and right coronary catheters.  The right catheter was used for selective angiography into the vein graft supplying the right coronary artery sequentially.  A left bypass graft catheter was necessary for selective angiography into the grafts, which had supplied the diagonal vessel. However, the graft supplying the diagonal vessel was not able to  be selectively cannulated with either of these catheters.  The LIMA graft was used for selective angiography into the left internal mammary artery.  The LIMA catheter was then also able to selectively engage the graft supplying the diagonal vessel.  5-French pigtail catheter was then inserted and RAO ventriculography was performed.  The patient tolerated the procedure well.  He returned to his room in satisfactory condition.  HEMODYNAMIC DATA:  Central aortic pressure 164/61.  Left ventricular pressure 156/60, post A-wave 13.  ANGIOGRAPHIC DATA:  Left main coronary artery was a long vessel that had mild calcification and 30% smooth, tapering distally before bifurcating to the LAD and left circumflex system.  The LAD had diffuse ostial/proximal 95% restenosis, then was occluded before the first septal perforating artery.  The circumflex vessel had 20% ostial narrowing.  This vessel gave rise to a high marginal vessel, which almost looks like a ramus intermediate- like takeoff.  The midportion of the AV groove circumflex prior to bifurcating into a marginal vessel now was 95% stenosed.  Looking at the note from 2009, apparently this may have been occluded at that time. There was 60% narrowing beyond the 95% stenosis in the circumflex vessel after giving rise to the smaller branch.  The right coronary artery was now totally occluded proximally.  There was faint antegrade collaterals supplying the  mid segment.  The vein graft sequentially supplying the PDA and PLA vessel was a large caliber graft.  There was smooth 50% narrowing in the distal third of the graft.  There was 50% to less than 60% eccentric stenosis just immediately proximal to the PDA anastomosis.  The sequential limb of the graft was widely patent, and the PLA branch and the distal RCA was free of significant disease.  The vein graft, which had supplied the circumflex marginal vessel, was now totally occluded  proximally.  The vein graft, which supplied the diagonal vessel, had scattered segmental 40% proximal narrowing and supplied the first diagonal vessel.  LIMA graft was widely patent and anastomosed into the mid LAD, which then also filled the second diagonal vessel.  RAO ventriculography revealed moderate LV dysfunction with an ejection fraction of 35-40% with anterolateral hypocontractility and mid-to-basal hypocontractility.  IMPRESSION: 1. Moderate LV dysfunction with an ejection fraction of 35-40% with     anterolateral and hypocontractility as well as basal inferior     hypocontractility. 2. Significant multivessel native coronary artery disease with 30%     distal left main stenosis; diffuse 95% ostial/proximal LAD stenosis     with total proximal LAD occlusion; 95% stenosis in the circumflex     vessel just proximal to the marginal takeoff. 3. Total occlusion of the proximal RCA. 4. Patent vein graft sequentially supplying the PDA and PLA vessel     over 50% tubular smooth narrowing in the distal third of the graft     and with 50-60% somewhat eccentric stenosis just proximal to the     PDA anastomosis with a patent sequential limb supplying the PLA     vessel. 5. New occlusion of the graft, which had supplied the circumflex     marginal vessel. 6. A 30-40% segmental narrowing in the proximal third of the graft     supplying the diagonal vessel. 7. Patent LIMA to the LAD.  RECOMMENDATION:  Mr. Ziller will be hydrated aggressively following the catheterizations.  He now was occluded his graft, which had supplied this marginal vessel.  Consideration for percutaneous coronary intervention to the native circumflex following 24-48 hours of hydration will be undertaken.          ______________________________ Nicki Guadalajara, M.D.     TK/MEDQ  D:  02/28/2011  T:  02/28/2011  Job:  [id number]  cc:   Renato Gails, M.D. Terrial Rhodes, M.D.

## 2011-03-02 ENCOUNTER — Encounter (HOSPITAL_COMMUNITY): Payer: Self-pay | Admitting: Anesthesiology

## 2011-03-02 ENCOUNTER — Ambulatory Visit (HOSPITAL_COMMUNITY): Admission: RE | Admit: 2011-03-02 | Payer: Medicare Other | Source: Ambulatory Visit | Admitting: Cardiovascular Disease

## 2011-03-02 ENCOUNTER — Other Ambulatory Visit: Payer: Self-pay

## 2011-03-02 ENCOUNTER — Encounter (HOSPITAL_COMMUNITY): Admission: RE | Payer: Self-pay | Source: Ambulatory Visit

## 2011-03-02 ENCOUNTER — Encounter (HOSPITAL_COMMUNITY)
Admission: RE | Disposition: A | Discharge status: Home / Self Care | Payer: Self-pay | Source: Ambulatory Visit | Attending: Cardiovascular Disease

## 2011-03-02 HISTORY — PX: PERCUTANEOUS CORONARY STENT INTERVENTION (PCI-S): SHX5485

## 2011-03-02 LAB — CBC
HCT: 26.5 % — ABNORMAL LOW (ref 39.0–52.0)
Hemoglobin: 8.9 g/dL — ABNORMAL LOW (ref 13.0–17.0)
MCH: 30.8 pg (ref 26.0–34.0)
MCHC: 33.6 g/dL (ref 30.0–36.0)
MCV: 91.7 fL (ref 78.0–100.0)
Platelets: 210 10*3/uL (ref 150–400)
RBC: 2.89 MIL/uL — ABNORMAL LOW (ref 4.22–5.81)
RDW: 12.6 % (ref 11.5–15.5)
WBC: 4.5 10*3/uL (ref 4.0–10.5)

## 2011-03-02 LAB — BASIC METABOLIC PANEL WITH GFR
BUN: 22 mg/dL (ref 6–23)
CO2: 24 meq/L (ref 19–32)
Calcium: 8.9 mg/dL (ref 8.4–10.5)
Chloride: 111 meq/L (ref 96–112)
Creatinine, Ser: 1.52 mg/dL — ABNORMAL HIGH (ref 0.50–1.35)
GFR calc Af Amer: 50 mL/min — ABNORMAL LOW
GFR calc non Af Amer: 43 mL/min — ABNORMAL LOW
Glucose, Bld: 122 mg/dL — ABNORMAL HIGH (ref 70–99)
Potassium: 3.8 meq/L (ref 3.5–5.1)
Sodium: 142 meq/L (ref 135–145)

## 2011-03-02 LAB — GLUCOSE, CAPILLARY: Glucose-Capillary: 120 mg/dL — ABNORMAL HIGH (ref 70–99)

## 2011-03-02 LAB — POCT ACTIVATED CLOTTING TIME
Activated Clotting Time: 72 seconds
Activated Clotting Time: 452 seconds

## 2011-03-02 LAB — HEPARIN LEVEL (UNFRACTIONATED): Heparin Unfractionated: 0.71 IU/mL — ABNORMAL HIGH (ref 0.30–0.70)

## 2011-03-02 SURGERY — PERCUTANEOUS CORONARY STENT INTERVENTION (PCI-S)
Anesthesia: LOCAL

## 2011-03-02 SURGERY — PERCUTANEOUS CORONARY STENT INTERVENTION (PCI-S)
Anesthesia: Choice

## 2011-03-02 MED ORDER — NITROGLYCERIN 0.2 MG/ML ON CALL CATH LAB
INTRAVENOUS | Status: AC
  Filled 2011-03-02: qty 1

## 2011-03-02 MED ORDER — BIVALIRUDIN 250 MG IV SOLR
INTRAVENOUS | Status: AC
  Filled 2011-03-02: qty 250

## 2011-03-02 MED ORDER — CLOPIDOGREL BISULFATE 75 MG PO TABS
150.0000 mg | ORAL_TABLET | Freq: Once | ORAL | Status: AC
  Administered 2011-03-02: 150 mg via ORAL
  Filled 2011-03-02 (×2): qty 2

## 2011-03-02 MED ORDER — LIDOCAINE HCL (PF) 1 % IJ SOLN
INTRAMUSCULAR | Status: AC
  Filled 2011-03-02: qty 30

## 2011-03-02 MED ORDER — ZOLPIDEM TARTRATE 5 MG PO TABS: 5.0000 mg | ORAL_TABLET | Freq: Every evening | ORAL | Status: DC | PRN

## 2011-03-02 MED ORDER — MIDAZOLAM HCL 2 MG/2ML IJ SOLN
INTRAMUSCULAR | Status: AC
  Filled 2011-03-02: qty 2

## 2011-03-02 MED ORDER — SODIUM CHLORIDE 0.9 % IV SOLN
0.2500 mg/kg/h | INTRAVENOUS | Status: AC
  Administered 2011-03-03: 0.25 mg/kg/h via INTRAVENOUS
  Filled 2011-03-02: qty 250

## 2011-03-02 MED ORDER — GLIPIZIDE 10 MG PO TABS
10.0000 mg | ORAL_TABLET | Freq: Two times a day (BID) | ORAL | Status: DC
  Administered 2011-03-02 – 2011-03-05 (×6): 10 mg via ORAL
  Filled 2011-03-02 (×8): qty 1

## 2011-03-02 MED ORDER — DIPHENHYDRAMINE HCL 25 MG PO CAPS
25.0000 mg | ORAL_CAPSULE | Freq: Four times a day (QID) | ORAL | Status: DC | PRN
  Administered 2011-03-02: 25 mg via ORAL
  Filled 2011-03-02: qty 1

## 2011-03-02 MED ORDER — ACETAMINOPHEN 325 MG PO TABS: 650.0000 mg | ORAL_TABLET | ORAL | Status: DC | PRN

## 2011-03-02 MED ORDER — HEPARIN (PORCINE) IN NACL 2-0.9 UNIT/ML-% IJ SOLN
INTRAMUSCULAR | Status: AC
  Filled 2011-03-02: qty 2000

## 2011-03-02 MED ORDER — CLOPIDOGREL BISULFATE 75 MG PO TABS
75.0000 mg | ORAL_TABLET | Freq: Every day | ORAL | Status: DC
  Administered 2011-03-03 – 2011-03-05 (×3): 75 mg via ORAL
  Filled 2011-03-02 (×4): qty 1

## 2011-03-02 MED ORDER — FENTANYL CITRATE 0.05 MG/ML IJ SOLN
INTRAMUSCULAR | Status: AC
  Filled 2011-03-02: qty 2

## 2011-03-02 MED ORDER — ASPIRIN EC 325 MG PO TBEC
325.0000 mg | DELAYED_RELEASE_TABLET | Freq: Every day | ORAL | Status: DC
  Administered 2011-03-02 – 2011-03-05 (×4): 325 mg via ORAL
  Filled 2011-03-02 (×4): qty 1

## 2011-03-02 MED ORDER — SODIUM CHLORIDE 0.9 % IV SOLN
INTRAVENOUS | Status: DC
  Administered 2011-03-03 (×2): via INTRAVENOUS

## 2011-03-02 MED ORDER — NITROGLYCERIN IN D5W 200-5 MCG/ML-% IV SOLN
2.0000 ug/min | INTRAVENOUS | Status: DC
  Filled 2011-03-02: qty 250

## 2011-03-02 MED ORDER — ONDANSETRON HCL 4 MG/2ML IJ SOLN: 4.0000 mg | Freq: Four times a day (QID) | INTRAMUSCULAR | Status: DC | PRN

## 2011-03-02 MED ORDER — ASPIRIN 81 MG PO CHEW
CHEWABLE_TABLET | ORAL | Status: AC
  Filled 2011-03-02: qty 4

## 2011-03-02 MED ORDER — MORPHINE SULFATE 2 MG/ML IJ SOLN
2.0000 mg | INTRAMUSCULAR | Status: DC | PRN
  Administered 2011-03-03 (×2): 2 mg via INTRAVENOUS
  Filled 2011-03-02 (×2): qty 1

## 2011-03-02 NOTE — Progress Notes (Signed)
Inpatient Diabetes Program Recommendations  AACE/ADA: New Consensus Statement on Inpatient Glycemic Control (2009)  Target Ranges:  Prepandial:   less than 140 mg/dL      Peak postprandial:   less than 180 mg/dL (1-2 hours)      Critically ill patients:  140 - 180 mg/dL   Reason for Visit: Note history of diabetes.  CBG's greater than 200 mg/dL  Inpatient Diabetes Program Recommendations HgbA1C: Please order to assess glycemic control for past 2-3 months.

## 2011-03-02 NOTE — Brief Op Note (Signed)
Attempted PCI of subtotal LCX  See completed dictation.  Unsuccessful secondary to calcified subtotal occlusion, unable to pass wire across lesion.  Complication: very distal tip of Choice PT moderate support wire snapped off wire and was un retrievable. Pt with improved collateral to subtotal distal LCX at end of procedure.  Stable hemodynamics, without cp or ECG changes.  [name], 77 y.o., male  DICTATION #  [phone], [id number]  Lennette Bihari, MD, [facility] 03/02/2011 6:25 PM

## 2011-03-02 NOTE — Progress Notes (Signed)
ANTICOAGULATION CONSULT NOTE - Initial Consult  Pharmacy Consult for angiomax Indication: post PCI  Allergies  Allergen Reactions  . Lisinopril Cough    Patient Measurements: Height: 5\' 11"  (180.3 cm) Weight: 205 lb 3.2 oz (93.078 kg) (scale b) IBW/kg (Calculated) : 75.3  Adjusted Body Weight:   Vital Signs: Temp: 97.9 F (36.6 C) (12/07 1930) Temp src: Oral (12/07 1930) BP: 153/66 mmHg (12/07 1318) Pulse Rate: 61  (12/07 1508)  Labs:  Alvira Philips 03/02/11 0615 03/01/11 2045 03/01/11 0620 02/28/11 0533  HGB 8.9* -- 9.0* --  HCT 26.5* -- 27.2* 28.9*  PLT 210 -- 228 238  APTT -- -- -- 32  LABPROT -- -- -- 15.3*  INR -- -- -- 1.18  HEPARINUNFRC 0.71* 0.58 0.21* --  CREATININE 1.52* -- -- 1.41*  CKTOTAL -- -- -- --  CKMB -- -- -- --  TROPONINI -- -- -- --   Estimated Creatinine Clearance: 48.2 ml/min (by C-G formula based on Cr of 1.52).  Medical History: Past Medical History  Diagnosis Date  . Angina at rest 02/27/2011  . CAD (coronary artery disease) 02/27/2011  . LV dysfunction 02/27/2011  . CKD (chronic kidney disease) stage 3, GFR 30-59 ml/min 02/27/2011  . LBBB (left bundle branch block)   . LBBB (left bundle branch block) 02/27/2011  . Hypertension   . Hyperlipemia 02/27/2011  . Diabetes mellitus 02/27/2011  . GERD (gastroesophageal reflux disease)   . CHF (congestive heart failure)   . Blood transfusion     no reaction to transfusion per patient  . Arthritis   . Myocardial infarction     Medications:  Scheduled:    . amLODipine  10 mg Oral Daily  . aspirin      . aspirin  324 mg Oral Pre-Cath  . aspirin EC  325 mg Oral Daily  . bivalirudin      . bivalirudin      . carvedilol  25 mg Oral BID WC  . clopidogrel  150 mg Oral Once  . clopidogrel  75 mg Oral Daily  . clopidogrel  75 mg Oral Q breakfast  . fentaNYL      . glipiZIDE  10 mg Oral BID  . heparin      . hydrALAZINE  25 mg Oral BID  . insulin aspart  0-9 Units Subcutaneous Q4H  . isosorbide  mononitrate  60 mg Oral Daily  . lidocaine      . midazolam      . midazolam      . multivitamins ther. w/minerals  1 tablet Oral Daily  . nitroGLYCERIN      . pantoprazole  40 mg Oral Q1200  . simvastatin  20 mg Oral q1800  . sodium chloride  3 mL Intravenous Q12H  . temazepam  30 mg Oral QHS  . DISCONTD: aspirin EC  81 mg Oral QHS   Infusions:    . sodium chloride    . bivalirudin (ANGIOMAX) infusion 5 mg/mL (Cath Lab,ACS,PCI indication)    . nitroGLYCERIN    . DISCONTD: sodium chloride 50 mL/hr at 02/27/11 1718  . DISCONTD: sodium chloride 50 mL/hr at 03/01/11 1900  . DISCONTD: sodium chloride 1 mL/kg/hr (03/02/11 0744)  . DISCONTD: heparin Stopped (03/02/11 1457)    Assessment: 77 yo male was attempted PCI and MD wants to continue Angiomax at low dose for 12 hours then d/c Goal of Therapy:     Plan:  1) Continue angiomax at 0.25mg /kg/hr x 12 hours then d/c  2) Monitor CBC in am 3) d/c heparin as ordered by MD  Dawt Reeb, Tsz-Yin 03/02/2011,8:33 PM

## 2011-03-02 NOTE — Progress Notes (Signed)
Subjective:  No chest pain or SOB overnight  Objective:  Vital Signs in the last 24 hours: Temp:  [97.1 F (36.2 C)-98 F (36.7 C)] 97.8 F (36.6 C) (12/07 0548) Pulse Rate:  [57-64] 58  (12/07 0548) Resp:  [18-20] 18  (12/07 0548) BP: (130-147)/(59-71) 145/59 mmHg (12/07 0548) SpO2:  [93 %-95 %] 93 % (12/07 0548) Weight:  [93.078 kg (205 lb 3.2 oz)] 205 lb 3.2 oz (93.078 kg) (12/07 0548)  Intake/Output from previous day:  Intake/Output Summary (Last 24 hours) at 03/02/11 0813 Last data filed at 03/02/11 0659  Gross per 24 hour  Intake 3861.8 ml  Output   2525 ml  Net 1336.8 ml    Physical Exam: General appearance: alert, cooperative, appears stated age and no distress Lungs: clear to auscultation bilaterally Heart: regular rate and rhythm, S1, S2 normal, no murmur, click, rub or gallop no echymosis or hematoma Rt groin   Rate: 60  Rhythm: sinus bradycardia  Lab Results:  Basename 03/02/11 0615 03/01/11 0620  WBC 4.5 5.4  HGB 8.9* 9.0*  PLT 210 228    Basename 03/02/11 0615 02/28/11 0533  NA 142 139  K 3.8 3.6  CL 111 105  CO2 24 25  GLUCOSE 122* 172*  BUN 22 30*  CREATININE 1.52* 1.41*   No results found for this basename: TROPONINI:2,CK,MB:2 in the last 72 hours Hepatic Function Panel No results found for this basename: PROT,ALBUMIN,AST,ALT,ALKPHOS,BILITOT,BILIDIR,IBILI in the last 72 hours No results found for this basename: CHOL in the last 72 hours  Basename 02/28/11 0533  INR 1.18    Imaging: No results found.  Cardiac Studies:  Assessment/Plan:   Principal Problem:  *CKD (chronic kidney disease) stage 3, GFR 30-59 ml/min, Cr 1.5 today  Active Problems:  Angina at rest, admitted for cath, help for 24hrs because of increased Cr on adm  CAD (coronary artery disease), CABG 1994, cath 12/5, elective CFX PCI today  LV dysfunction, EF 40-50% by 2D 2010  LBBB (left bundle branch block)  Hyperlipemia  Diabetes mellitus  Plan- PCI  today    [name] PA-C 03/02/2011, 8:13 AM

## 2011-03-02 NOTE — Progress Notes (Signed)
Pt. Seen and examined. Agree with the NP/PA-C note as written. CKD .[name] Creatinine 1.5 today.  Hydrate. Planned PCI per Dr. Tresa Endo today.  Chrystie Nose, MD Attending Cardiologist [facility]

## 2011-03-03 LAB — GLUCOSE, CAPILLARY
Glucose-Capillary: 167 mg/dL — ABNORMAL HIGH (ref 70–99)
Glucose-Capillary: 177 mg/dL — ABNORMAL HIGH (ref 70–99)
Glucose-Capillary: 225 mg/dL — ABNORMAL HIGH (ref 70–99)
Glucose-Capillary: 68 mg/dL — ABNORMAL LOW (ref 70–99)
Glucose-Capillary: 72 mg/dL (ref 70–99)

## 2011-03-03 LAB — CBC
HCT: 24.2 % — ABNORMAL LOW (ref 39.0–52.0)
MCHC: 34.7 g/dL (ref 30.0–36.0)
MCV: 92 fL (ref 78.0–100.0)
Platelets: 181 10*3/uL (ref 150–400)
RDW: 12.8 % (ref 11.5–15.5)

## 2011-03-03 LAB — BASIC METABOLIC PANEL
BUN: 15 mg/dL (ref 6–23)
Chloride: 109 mEq/L (ref 96–112)
Creatinine, Ser: 1.37 mg/dL — ABNORMAL HIGH (ref 0.50–1.35)
Glucose, Bld: 60 mg/dL — ABNORMAL LOW (ref 70–99)
Potassium: 3.6 mEq/L (ref 3.5–5.1)

## 2011-03-03 MED ORDER — ATROPINE SULFATE 1 MG/ML IJ SOLN
INTRAMUSCULAR | Status: AC
  Filled 2011-03-03: qty 1

## 2011-03-03 NOTE — Cardiovascular Report (Signed)
NAMEHARTLEY, URTON           ACCOUNT NO.:  [id number]  MEDICAL RECORD NO.:  [id number]  LOCATION:  2927                         FACILITY:  [facility]  PHYSICIAN:  Nicki Guadalajara, M.D.     DATE OF BIRTH:  25-Mar-1933  DATE OF PROCEDURE:  03/02/2011 DATE OF DISCHARGE:                           CARDIAC CATHETERIZATION   PROCEDURE:  Attempted percutaneous coronary intervention, native left circumflex coronary artery.  INDICATIONS:  [name] is a 77 year old gentleman who has a longstanding history of hypertension, diabetes mellitus, hyperlipidemia. He is status post remote intervention with PTCA of his RCA in 1987, in 1990, status post circumflex PTCA in 1993, with RCA PTCA in September 1994.  In December 1994, he underwent CABG surgery x5, with a LIMA to the LAD, vein graft to the first diagonal, vein graft to the marginal and left circumflex coronary artery, and sequential graft to the PDA and PLA branches of the right coronary artery done by Dr. Laneta Simmers. Catheterizations in January 2009, showed an EF of 35% with 50-60% narrowing in the body of the sequential graft before the PDA, at which time other grafts were patent.  He also has left bundle-branch block. He has peripheral vascular disease and is status post right carotid endarterectomy and also has renal insufficiency.  He underwent cardiac catheterizations on February 28, 2011, which showed moderate LV dysfunction with an EF 35-40% with anterolateral hypocontractility as well as basal inferior hypocontractility.  He has significant multivessel native CAD with 30% distal left main stenosis, diffuse 95% ostial proximal LAD stenosis with total proximal LAD occlusion, calcification and tortuosity in the proximal circumflex, but with 99% stenosis just proximal to the marginal takeoff.  Of note, in the patient's prior catheterizations in 1999, the circumflex was totally occluded.  He did have occlusion of the proximal native  right coronary artery.  The graft to the circumflex was now occluded which was new of questionable duration.  The graft to the diagonal was occluded which was old.  The vein graft sequentially supplying the PDA and PLA vessel had 50% tubular smooth narrowing in the distal third with 50-60% eccentric stenosis proximal to the PDA anastomosis with a patent sequential limb supplying the PLA vessel and he had a patent LIMA to the LAD.  The patient was vigorously hydrated due to his renal insufficiency.  With a demonstration of a new occlusion of questionable duration to the graft which had supplied the circumflex, the patient was brought back to the catheterization laboratory today 2 days later in an attempt to open up his native circumflex coronary artery.  DESCRIPTION OF PROCEDURE:  After premedication with Versed 1 mg plus fentanyl 25 mcg, the patient was prepped and draped in usual fashion. His right femoral artery was punctured anteriorly and a 6-French sheath was inserted.  A 6-French [id number] guide was used for the procedure. Bivalirudin was administered and ACT was documented to be therapeutic. The patient had been started on Plavix on February 27, 2011.  Initially, a Prowater wire was advanced into the left main.  There was difficulty in navigating into the tortuous sharp angled circumflex with calcification proximally.  A Prowater wire was never able to get into the proximal circumflex beyond  the very ostium.  Consequently, it was felt that at least moderate support wire would be necessary to navigate due to the angulation and calcification and vessel tortuosity.  A ChoICE PT moderate support wire was then inserted.  This wire was then able to be advanced into the very proximal circumflex but due to the tortuosity, it could not on its own get beyond the proximal segment and was unable to reach the lesion.  A 2.0 x 12 mm Emerge balloon was then inserted and with the Emerge balloon, the  extra support allowed the wire to reach the 99% stenosis at the bifurcation of the marginal vessel.  Again, this was calcified and most likely had been in the site of remote chronic occlusion.  Long attempts were made trying to cross the total occlusion. Unfortunately, these were unsuccessful.  Every time, the wire would seem to reach the calcified subtotal stenosis, the wire would tend to yield leftward.  The balloon was brought down to this area to allow for additional support.  The wire was then additionally navigated and fluoroscopy then revealed that a small apparent tip of the wire had split off from the main shaft of the wire.  This was approximately 10 mm in length.  At that point, there was significant discussion as to attempts of potentially trying to retrieve the distal tip of the wire which had snapped off the remaining wire.  However, with the marked vessel tortuosity and the calcification, and the hydrophilic coating, it was felt that the vessel was not large enough and snaring this wire was not an option.  At this time, the remaining portion of the wire was removed.  It was felt that if another wire could be inserted perhaps and crossed the lesion, possibly this wire could then be stented against the wall.  A long Luge wire was then advanced into the vessel after the balloon had been pulled back, and again this wire was unable to go beyond the very proximal circumflex on its own.  An over the wire, 1.5- mm balloon was then inserted to help give support to the wire.  This was then allowed the wire to reach the point of subtotal occlusion. Unfortunately, again despite multiple attempts, this wire was unable to cross the subtotal occlusion.  At this point, the patient remained entirely pain free.  He had stable hemodynamics.  The vessel was subtotally occluded from the start.  Scout angiography now revealed improved collaterals from the LAD system, supplying the  distal circumflex.  The patient was still on bivalirudin.  He was on IV nitroglycerin.  After much discussion with the patient, describing all of the events that had transpired, the decision was made to abort the further attempted intervention since the patient was stable.  Even though the vessel was subtotally occluded at the site of previous subtotal occlusion, it now had improved left-to-left collaterals from the LAD system, supplying the distal circumflex.  The patient's sheath was sutured in place.  The plan is to continue bivalirudin overnight. The patient will receive an additional Plavix dose today.  He will also be checked for and verified now to make certain he is a platelet responder.  The patient left the Catheterization Suite in stable condition, chest pain free.  HEMODYNAMIC DATA:  Central aortic pressure 153/66.  ANGIOGRAPHIC DATA:  The left main coronary artery is a long vessel that had previously noted 30% distal narrowing and was calcified. Previously, the LAD was occluded proximally.  There was  a high marginal ramus intermediate vessel.  The proximal circumflex had calcification came and arose with greater than 90% angle from the left main.  This stent had another sharp angle of approximately 90% in an area that was calcified with narrowing of at least 30-40%.  The vessel was then subtotally occluded 99% after marginal branch and after the left atrial circumflex branch.  As noted above, the wire was never able to cross the subtotal occlusion despite balloon support and a ChoICE PT moderate support wire.  Unfortunately, due to wire manipulation and torquing in an attempt to cross this subtotal stenosis, a very small distal portion of the wire seemed to snap and remained at the site of subtotal occlusion.  Despite further attempts at crossing the subtotal occlusion with another wire, this was unsuccessful.  However, scout angiography did show transient antegrade  slowing of flow, but significantly improved collateralization to the circumflex marginal vessel retrograde.  During the procedure, the patient was titrated up to 30 mcg of intravenous nitroglycerin.  He received several doses of intracoronary nitroglycerin.  ACT was documented to be therapeutic.  He left the Catheterization Suite with stable condition, chest pain free with stable hemodynamics.  IMPRESSION: 1. Difficult and unsuccessful attempt at percutaneous coronary     intervention to a subtotally occluded calcified circumflex vessel     and the vessel which most likely had been previously chronically     occluded. 2. Residual distal tip of the ChoICE PT moderate support wire at the     subtotal occlusion due to the very distal tip snapping from the     wire as a result of wire torquing in an attempt to cross the     subtotal occlusion. 3. No change in the subtotal occlusion at the completion of the     procedure, but with improved retrograde collateralization to the     distal circumflex.  RECOMMENDATIONS:  The patient will be maintained on bivalirudin overnight.  He will be given additional Plavix today.  He will continue IV nitroglycerin tonight with plan to wean this and switch to oral nitrates with continued medical therapy for his concomitant coronary artery disease.          ______________________________ Nicki Guadalajara, M.D.     TK/MEDQ  D:  03/02/2011  T:  03/03/2011  Job:  [id number]

## 2011-03-03 NOTE — Progress Notes (Signed)
Subjective:  No chest pain  Objective:  Vital Signs in the last 24 hours: Temp:  [97.1 F (36.2 C)-98.7 F (37.1 C)] 98 F (36.7 C) (12/08 0400) Pulse Rate:  [55-69] 64  (12/08 0700) Resp:  [12-19] 15  (12/08 0700) BP: (110-164)/(48-74) 123/50 mmHg (12/08 0700) SpO2:  [90 %-97 %] 95 % (12/08 0700) Arterial Line BP: (123-177)/(50-68) 133/52 mmHg (12/08 0700) FiO2 (%):  [28 %] 28 % (12/08 0400)  Intake/Output from previous day:  Intake/Output Summary (Last 24 hours) at 03/03/11 0836 Last data filed at 03/03/11 0600  Gross per 24 hour  Intake 2482.45 ml  Output   1800 ml  Net 682.45 ml    Physical Exam: General appearance: alert, cooperative and no distress Lungs: clear to auscultation bilaterally Heart: regular rate and rhythm, S1, S2 normal, no murmur, click, rub or gallop A-line in place RFA   Rate: 62  Rhythm: normal sinus rhythm, LBBB  Lab Results:  Basename 03/03/11 0500 03/02/11 0615  WBC 3.6* 4.5  HGB 8.4* 8.9*  PLT 181 210    Basename 03/03/11 0500 03/02/11 0615  NA 140 142  K 3.6 3.8  CL 109 111  CO2 22 24  GLUCOSE 60* 122*  BUN 15 22  CREATININE 1.37* 1.52*   No results found for this basename: TROPONINI:2,CK,MB:2 in the last 72 hours Hepatic Function Panel No results found for this basename: PROT,ALBUMIN,AST,ALT,ALKPHOS,BILITOT,BILIDIR,IBILI in the last 72 hours No results found for this basename: CHOL in the last 72 hours No results found for this basename: INR in the last 72 hours  Imaging: No results found.  Cardiac Studies:  Assessment/Plan:   Principal Problem:  *CKD (chronic kidney disease) stage 3, GFR 30-59 ml/min  Cr stable 1.3  Active Problems:  Angina at rest  CAD (coronary artery disease), s/p complex PCI attempt 12/8  LV dysfunction  LBBB (left bundle branch block)  Hyperlipemia  Diabetes mellitus   Plan- MD to see, Angiomax to stop this AM, A-line to come out today.   [name] PA-C 03/03/2011, 8:36 AM  I have  seen and examined the patient along with [name], PA.  I have reviewed the chart, notes and new data.  I agree with PA's note.  Key new complaints: completely angina free despite fractured wire tip in LCX - suspect lesion may have already been essentially total (bridging collaterals?) which is why retained lead has had little clinical impact so far. Key examination changes: arterial line still in place, will remove today Key new findings / data: creat improved  PLAN: Pull sheath, sit up in bed later. Continue medical therapy  Mattisyn Cardona 03/03/2011, 8:41 AM

## 2011-03-04 LAB — GLUCOSE, CAPILLARY: Glucose-Capillary: 151 mg/dL — ABNORMAL HIGH (ref 70–99)

## 2011-03-04 LAB — CBC
Platelets: 190 10*3/uL (ref 150–400)
RBC: 2.72 MIL/uL — ABNORMAL LOW (ref 4.22–5.81)
RDW: 12.6 % (ref 11.5–15.5)
WBC: 4.4 10*3/uL (ref 4.0–10.5)

## 2011-03-04 NOTE — Progress Notes (Signed)
[facility]  DAILY PROGRESS NOTE   Subjective:  No angina or dyspnea.  Objective:  Temp:  [97.9 F (36.6 C)-98.4 F (36.9 C)] 98.1 F (36.7 C) (12/09 0805) Pulse Rate:  [57-68] 67  (12/09 0805) Resp:  [10-18] 18  (12/09 0805) BP: (115-147)/(40-97) 125/88 mmHg (12/09 0805) SpO2:  [90 %-97 %] 94 % (12/09 0805) Arterial Line BP: (137-166)/(51-61) 156/57 mmHg (12/08 1400) Weight change:   Intake/Output from previous day: 12/08 0701 - 12/09 0700 In: 4038 [P.O.:1320; I.V.:2718] Out: 1400 [Urine:1400]  Intake/Output from this shift:    Medications: Current Facility-Administered Medications  Medication Dose Route Frequency Provider Last Rate Last Dose  . 0.9 %  sodium chloride infusion  250 mL Intravenous PRN Abelino Derrick, PA      . acetaminophen (TYLENOL) tablet 650 mg  650 mg Oral Q4H PRN Lennette Bihari, MD      . alum & mag hydroxide-simeth (MAALOX/MYLANTA) 200-200-20 MG/5ML suspension 30 mL  30 mL Oral Q6H PRN Leone Brand, NP      . amLODipine (NORVASC) tablet 10 mg  10 mg Oral Daily Leone Brand, NP   10 mg at 03/03/11 1142  . aspirin EC tablet 325 mg  325 mg Oral Daily Lennette Bihari, MD   325 mg at 03/03/11 1144  . atropine 1 MG/ML injection           . carvedilol (COREG) tablet 25 mg  25 mg Oral BID WC Leone Brand, NP   25 mg at 03/03/11 1831  . clopidogrel (PLAVIX) tablet 75 mg  75 mg Oral Q breakfast Lennette Bihari, MD   75 mg at 03/03/11 0859  . diphenhydrAMINE (BENADRYL) capsule 25 mg  25 mg Oral Q6H PRN Lisette Abu Hilty   25 mg at 03/02/11 2121  . glipiZIDE (GLUCOTROL) tablet 10 mg  10 mg Oral BID WC Mickeal Skinner, PHARMD   10 mg at 03/03/11 1832  . hydrALAZINE (APRESOLINE) tablet 25 mg  25 mg Oral BID Leone Brand, NP   25 mg at 03/03/11 2143  . insulin aspart (novoLOG) injection 0-9 Units  0-9 Units Subcutaneous Q4H Eda Paschal [place], [place]   2 Units at 03/03/11 2351  . isosorbide mononitrate (IMDUR) 24 hr tablet 60 mg  60 mg Oral  Daily Lennette Bihari, MD   60 mg at 03/03/11 1144  . morphine 2 MG/ML injection 2 mg  2 mg Intravenous Q2H PRN Lennette Bihari, MD   2 mg at 03/03/11 1351  . multivitamins ther. w/minerals tablet 1 tablet  1 tablet Oral Daily Leone Brand, NP   1 tablet at 03/03/11 1145  . ondansetron (ZOFRAN) injection 4 mg  4 mg Intravenous Q6H PRN Lennette Bihari, MD      . pantoprazole (PROTONIX) EC tablet 40 mg  40 mg Oral Q1200 Arman Filter, RPH   40 mg at 03/03/11 1334  . simvastatin (ZOCOR) tablet 20 mg  20 mg Oral q1800 Leone Brand, NP   20 mg at 03/03/11 1831  . sodium chloride 0.9 % injection 3 mL  3 mL Intravenous PRN [name], MD      . sodium chloride 0.9 % injection 3 mL  3 mL Intravenous Q12H Eda Paschal [place], PA   3 mL at 03/03/11 2142  . sodium chloride 0.9 % injection 3 mL  3 mL Intravenous PRN Abelino Derrick, PA      .  temazepam (RESTORIL) capsule 30 mg  30 mg Oral QHS Eda Paschal [place], [place]   30 mg at 03/03/11 2143  . zolpidem (AMBIEN) tablet 5 mg  5 mg Oral QHS PRN Lennette Bihari, MD      . DISCONTD: 0.9 %  sodium chloride infusion   Intravenous Continuous Lennette Bihari, MD 125 mL/hr at 03/03/11 2352    . DISCONTD: nitroGLYCERIN 0.2 mg/mL in dextrose 5 % infusion  2-200 mcg/min Intravenous Continuous Lennette Bihari, MD 9 mL/hr at 03/02/11 2120 30 mcg/min at 03/02/11 2120    Physical Exam: General appearance: alert, cooperative and no distress Neck: no adenopathy, no carotid bruit, no JVD, supple, symmetrical, trachea midline and thyroid not enlarged, symmetric, no tenderness/mass/nodules Lungs: clear to auscultation bilaterally Heart: regular rate and rhythm, S1, S2 normal, no murmur, click, rub or gallop Abdomen: soft, non-tender; bowel sounds normal; no masses,  no organomegaly Extremities: extremities normal, atraumatic, no cyanosis or edema and no groin hematoma Pulses: 2+ and symmetric Skin: Skin color, texture, turgor normal. No rashes or lesions Neurologic: Alert and  oriented X 3, normal strength and tone. Normal symmetric reflexes. Normal coordination and gait  Lab Results: Results for orders placed during the hospital encounter of 02/27/11 (from the past 48 hour(s))  POCT ACTIVATED CLOTTING TIME     Status: Normal   Collection Time   03/02/11  3:48 PM      Component Value Range Comment   Activated Clotting Time 452     GLUCOSE, CAPILLARY     Status: Abnormal   Collection Time   03/02/11  5:39 PM      Component Value Range Comment   Glucose-Capillary 145 (*) 70 - 99 (mg/dL)   GLUCOSE, CAPILLARY     Status: Abnormal   Collection Time   03/02/11  7:15 PM      Component Value Range Comment   Glucose-Capillary 152 (*) 70 - 99 (mg/dL)   GLUCOSE, CAPILLARY     Status: Abnormal   Collection Time   03/02/11 11:46 PM      Component Value Range Comment   Glucose-Capillary 112 (*) 70 - 99 (mg/dL)    Comment 1 Notify RN      Comment 2 Documented in Chart     GLUCOSE, CAPILLARY     Status: Abnormal   Collection Time   03/03/11  3:38 AM      Component Value Range Comment   Glucose-Capillary 68 (*) 70 - 99 (mg/dL)    Comment 1 Repeat Test     GLUCOSE, CAPILLARY     Status: Normal   Collection Time   03/03/11  3:39 AM      Component Value Range Comment   Glucose-Capillary 72  70 - 99 (mg/dL)    Comment 1 Notify RN      Comment 2 Documented in Chart     CBC     Status: Abnormal   Collection Time   03/03/11  5:00 AM      Component Value Range Comment   WBC 3.6 (*) 4.0 - 10.5 (K/uL)    RBC 2.63 (*) 4.22 - 5.81 (MIL/uL)    Hemoglobin 8.4 (*) 13.0 - 17.0 (g/dL)    HCT 16.1 (*) 09.6 - 52.0 (%)    MCV 92.0  78.0 - 100.0 (fL)    MCH 31.9  26.0 - 34.0 (pg)    MCHC 34.7  30.0 - 36.0 (g/dL)    RDW 04.5  40.9 - 81.1 (%)  Platelets 181  150 - 400 (K/uL)   BASIC METABOLIC PANEL     Status: Abnormal   Collection Time   03/03/11  5:00 AM      Component Value Range Comment   Sodium 140  135 - 145 (mEq/L)    Potassium 3.6  3.5 - 5.1 (mEq/L)    Chloride 109  96  - 112 (mEq/L)    CO2 22  19 - 32 (mEq/L)    Glucose, Bld 60 (*) 70 - 99 (mg/dL)    BUN 15  6 - 23 (mg/dL)    Creatinine, Ser 1.61 (*) 0.50 - 1.35 (mg/dL)    Calcium 8.4  8.4 - 10.5 (mg/dL)    GFR calc non Af Amer 49 (*) >90 (mL/min)    GFR calc Af Amer 56 (*) >90 (mL/min)   GLUCOSE, CAPILLARY     Status: Normal   Collection Time   03/03/11  8:23 AM      Component Value Range Comment   Glucose-Capillary 76  70 - 99 (mg/dL)   GLUCOSE, CAPILLARY     Status: Abnormal   Collection Time   03/03/11 12:13 PM      Component Value Range Comment   Glucose-Capillary 128 (*) 70 - 99 (mg/dL)   GLUCOSE, CAPILLARY     Status: Abnormal   Collection Time   03/03/11  4:02 PM      Component Value Range Comment   Glucose-Capillary 167 (*) 70 - 99 (mg/dL)   GLUCOSE, CAPILLARY     Status: Abnormal   Collection Time   03/03/11  8:32 PM      Component Value Range Comment   Glucose-Capillary 225 (*) 70 - 99 (mg/dL)    Comment 1 Notify RN      Comment 2 Documented in Chart     GLUCOSE, CAPILLARY     Status: Abnormal   Collection Time   03/03/11 11:42 PM      Component Value Range Comment   Glucose-Capillary 177 (*) 70 - 99 (mg/dL)   GLUCOSE, CAPILLARY     Status: Abnormal   Collection Time   03/04/11  4:33 AM      Component Value Range Comment   Glucose-Capillary 114 (*) 70 - 99 (mg/dL)   CBC     Status: Abnormal   Collection Time   03/04/11  6:10 AM      Component Value Range Comment   WBC 4.4  4.0 - 10.5 (K/uL)    RBC 2.72 (*) 4.22 - 5.81 (MIL/uL)    Hemoglobin 8.3 (*) 13.0 - 17.0 (g/dL)    HCT 09.6 (*) 04.5 - 52.0 (%)    MCV 91.5  78.0 - 100.0 (fL)    MCH 30.5  26.0 - 34.0 (pg)    MCHC 33.3  30.0 - 36.0 (g/dL)    RDW 40.9  81.1 - 91.4 (%)    Platelets 190  150 - 400 (K/uL)   GLUCOSE, CAPILLARY     Status: Abnormal   Collection Time   03/04/11  8:04 AM      Component Value Range Comment   Glucose-Capillary 122 (*) 70 - 99 (mg/dL)    Comment 1 Notify RN       Imaging: No results  found.  Assessment:  1. Principal Problem: 2.  *CKD (chronic kidney disease) stage 3, GFR 30-59 ml/min 3. Active Problems: 4.  Angina at rest 5.  CAD (coronary artery disease) 6.  LV dysfunction 7.  LBBB (left bundle  branch block) 8.  Hyperlipemia 9.  Diabetes mellitus 10. fractured angioplasty guidewire in chronically occluded left circumflex artery.  Plan:  1. Wean off NTG iv. Transfer to telemetry. Increase ambulation. Probably dc in AM. We discussed future management options, including redo bypass (which at this point does not appear justified) and the potential complications of retained guidewire fragment.  Time Spent Directly with Patient:  30 minutes  Length of Stay:  LOS: 5 days    Nereyda Bowler 03/04/2011, 9:27 AM

## 2011-03-05 LAB — BASIC METABOLIC PANEL
BUN: 15 mg/dL (ref 6–23)
CO2: 23 mEq/L (ref 19–32)
Chloride: 112 mEq/L (ref 96–112)
GFR calc Af Amer: 50 mL/min — ABNORMAL LOW (ref >90)
Potassium: 3.9 mEq/L (ref 3.5–5.1)

## 2011-03-05 LAB — CBC
HCT: 25 % — ABNORMAL LOW (ref 39.0–52.0)
MCV: 91.9 fL (ref 78.0–100.0)
RBC: 2.72 MIL/uL — ABNORMAL LOW (ref 4.22–5.81)
RDW: 12.9 % (ref 11.5–15.5)
WBC: 4 10*3/uL (ref 4.0–10.5)

## 2011-03-05 LAB — GLUCOSE, CAPILLARY
Glucose-Capillary: 108 mg/dL — ABNORMAL HIGH (ref 70–99)
Glucose-Capillary: 86 mg/dL (ref 70–99)

## 2011-03-05 MED ORDER — CLOPIDOGREL BISULFATE 75 MG PO TABS: 75.0000 mg | ORAL_TABLET | Freq: Every day | ORAL | Status: DC

## 2011-03-05 MED ORDER — ISOSORBIDE MONONITRATE ER 60 MG PO TB24: 60.0000 mg | ORAL_TABLET | Freq: Every day | ORAL | Status: DC

## 2011-03-05 MED ORDER — INSULIN ASPART 100 UNIT/ML ~~LOC~~ SOLN: 0.0000 [IU] | Freq: Every day | SUBCUTANEOUS | Status: DC

## 2011-03-05 MED ORDER — LOSARTAN POTASSIUM 50 MG PO TABS: 50.0000 mg | ORAL_TABLET | Freq: Every day | ORAL | Status: DC

## 2011-03-05 MED ORDER — INSULIN ASPART 100 UNIT/ML ~~LOC~~ SOLN
0.0000 [IU] | Freq: Three times a day (TID) | SUBCUTANEOUS | Status: DC
  Administered 2011-03-05: 2 [IU] via SUBCUTANEOUS

## 2011-03-05 NOTE — Discharge Summary (Signed)
Physician Discharge Summary  Patient ID: [name] MRN: [id number] DOB/AGE: [birth year] 77 y.o.  Admit date: 02/27/2011 Discharge date: 03/05/2011  Admission Diagnoses:  Discharge Diagnoses:  Principal Problem:  *CKD (chronic kidney disease) stage 3, GFR 30-59 ml/min Active Problems:  Angina at rest  CAD (coronary artery disease)  LV dysfunction  LBBB (left bundle branch block)  Hyperlipemia  Diabetes mellitus   Discharged Condition: stable  Hospital Course:  77 year old, white, married male with a history of coronary artery bypass grafting in 1994 and repeat cardiac catheterization in 2009 which revealed patent grafts except for a 50-60% smooth stenosis in the distal portion of the PDA, PLA sequential vein graft. Other history includes hypertension dyslipidemia diabetes mellitus type 2 non-insulin-dependent and chronic kidney disease.  Additionally in 2009 EF was 30-35% but on 2 D echo, done 2010, EF it had improved to 40-45%. presented to the short stay Center at [facility] 02/27/2011 for elective cardiac catheterization. His creatinine in the office had been 1.98 and he typically runs 1.3.repeat creatinine at [facility] was 1.82. Dr. Allyson Sabal felt he should be admitted as observation and hydrate him prior to his cardiac catheterization. He was admitted to telemetry bed.   The patient has been having increased discomfort in his chest that wakes him from sleep. Dr. Allyson Sabal was concerned that this rest angina may be secondary to increased stenosis within the distal portion of the PDA, PLA vein graft.  The patient had a left heart catheterization on 02/28/2011.  This revealed high-grade circumflex calcification. PCI of circumflex was attempted on 03/02/2011. This was unsuccessful and resulted in very distal tip of the Choice PT moderate support wire 2 snapped off and become unretrievable.  Patient will be discharged home in stable condition. He has been seen by Dr. Herbie Baltimore.   He'll have a followup basic metabolic panel and CBC prior to followup appointment with Dr. Allyson Sabal.  He'll go home on half dose of Cozaar 50 mg daily. He'll resume Lasix.  Significant Diagnostic Studies: DICTATION # [id number] and DICTATION # [id number]  Attempted PCI of subtotal LCX  See completed dictation.  Unsuccessful secondary to calcified subtotal occlusion, unable to pass wire across lesion.  Complication: very distal tip of Choice PT moderate support wire snapped off wire and was un retrievable. Pt with improved collateral to subtotal distal LCX at end of procedure. Stable hemodynamics, without cp or ECG changes.  Discharge Exam: Blood pressure 159/74, pulse 64, temperature 97.4 F (36.3 C), temperature source Oral, resp. rate 18, height 5\' 11"  (1.803 m), weight 93.078 kg (205 lb 3.2 oz), SpO2 93.00%.  Disposition:   Discharge Orders    Future Orders Please Complete By Expires   Diet - low sodium heart healthy      Scheduling Instructions:   Decreased carbohydrate.   Increase activity slowly      Discharge instructions      Comments:   If the catheter site becomes red, painful or swollen, call our office.     Current Discharge Medication List    START taking these medications   Details  isosorbide mononitrate (IMDUR) 60 MG 24 hr tablet Take 1 tablet (60 mg total) by mouth daily. Qty: 30 tablet, Refills: 3      CONTINUE these medications which have CHANGED   Details  clopidogrel (PLAVIX) 75 MG tablet Take 1 tablet (75 mg total) by mouth daily. Qty: 30 tablet, Refills: 10    losartan (COZAAR) 50 MG tablet Take 1 tablet (  50 mg total) by mouth daily. Qty: 30 tablet, Refills: 3      CONTINUE these medications which have NOT CHANGED   Details  amLODipine (NORVASC) 10 MG tablet Take 10 mg by mouth daily.      aspirin EC 81 MG tablet Take 81 mg by mouth at bedtime.      carvedilol (COREG) 25 MG tablet Take 25 mg by mouth 2 (two) times daily with a meal.      furosemide  (LASIX) 40 MG tablet Take 40 mg by mouth daily.      glipiZIDE (GLUCOTROL) 10 MG tablet Take 10 mg by mouth 2 (two) times daily.      Glucosamine-Chondroitin (MOVE FREE PO) Take 1 tablet by mouth daily.      hydrALAZINE (APRESOLINE) 25 MG tablet Take 25 mg by mouth 2 (two) times daily.      Multiple Vitamins-Minerals (MULTIVITAMINS THER. W/MINERALS) TABS Take 1 tablet by mouth daily.      omeprazole (PRILOSEC) 20 MG capsule Take 20 mg by mouth daily.      pravastatin (PRAVACHOL) 40 MG tablet Take 40 mg by mouth at bedtime.         Follow-up Information    Follow up with [name], MD. (March 15, 2011 at 1030hrs)    Contact information:   [address] [place] [address] [phone]          Signed: Dwana Melena 03/05/2011, 2:09 PM  I saw [name] this afternoon.  He has remained stable over the weekend after attempted / unsuccessful PCI.  He has been ambulating without angina.  As per the discussion with my partners this AM, he is to be discharged with medical therapy & f/u with Dr. Allyson Sabal.   I agree with [name] d/c summary.   Marykay Lex, M.D., M.S. [facility] [address] [place], [place]  [address]  [phone]  03/05/2011 5:22 PM

## 2011-03-05 NOTE — Progress Notes (Signed)
Pt d/c home at 1510 with instructions, r/x, and f/u appointments. Pt and wife verbalized understanding of instuctions, home with family.  Ninetta Lights 03/05/2011

## 2011-03-05 NOTE — Progress Notes (Signed)
CARDIAC REHAB PHASE I   PRE:  Rate/Rhythm: 59-60  BP:  Supine:   Sitting: 130/60  Standing:    SaO2: 96% RA  MODE:  Ambulation: 550  ft   POST:  Rate/Rhythem: 66  BP:  Supine:   Sitting: 138/60  Standing:    SaO2: 98% RA  0910 - 0958 Pt ambulated with 1 assist steady tolerated well. Diet and exercise education done.   Rosalie Doctor

## 2011-03-05 NOTE — Progress Notes (Signed)
Patient ID: [name], male   DOB: 08-22-1932, 77 y.o.   MRN: [id number]   [facility]  Subjective: No CP or SOB.  Increased lower extremity edema.  Objective: Vital signs in last 24 hours: Temp:  [97.4 F (36.3 C)-98 F (36.7 C)] 97.4 F (36.3 C) (12/10 0419) Pulse Rate:  [61-65] 64  (12/10 0419) Resp:  [18-19] 18  (12/10 0419) BP: (125-151)/(55-88) 132/61 mmHg (12/10 0419) SpO2:  [93 %-95 %] 93 % (12/10 0419) Last BM Date: 03/04/11  Intake/Output from previous day: 12/09 0701 - 12/10 0700 In: 1231 [P.O.:1170; I.V.:61] Out: 1300 [Urine:1300] Intake/Output this shift:    Medications Current Facility-Administered Medications  Medication Dose Route Frequency Provider Last Rate Last Dose  . 0.9 %  sodium chloride infusion  250 mL Intravenous PRN Abelino Derrick, PA      . acetaminophen (TYLENOL) tablet 650 mg  650 mg Oral Q4H PRN Lennette Bihari, MD      . alum & mag hydroxide-simeth (MAALOX/MYLANTA) 200-200-20 MG/5ML suspension 30 mL  30 mL Oral Q6H PRN Leone Brand, NP      . amLODipine (NORVASC) tablet 10 mg  10 mg Oral Daily Leone Brand, NP   10 mg at 03/04/11 0948  . aspirin EC tablet 325 mg  325 mg Oral Daily Lennette Bihari, MD   325 mg at 03/04/11 0951  . carvedilol (COREG) tablet 25 mg  25 mg Oral BID WC Leone Brand, NP   25 mg at 03/05/11 9562  . clopidogrel (PLAVIX) tablet 75 mg  75 mg Oral Q breakfast Lennette Bihari, MD   75 mg at 03/04/11 0951  . diphenhydrAMINE (BENADRYL) capsule 25 mg  25 mg Oral Q6H PRN Lisette Abu Hilty   25 mg at 03/02/11 2121  . glipiZIDE (GLUCOTROL) tablet 10 mg  10 mg Oral BID WC Mickeal Skinner, PHARMD   10 mg at 03/05/11 1308  . hydrALAZINE (APRESOLINE) tablet 25 mg  25 mg Oral BID Leone Brand, NP   25 mg at 03/04/11 2250  . insulin aspart (novoLOG) injection 0-9 Units  0-9 Units Subcutaneous Q4H Eda Paschal [place], [place]   2 Units at 03/04/11 2018  . isosorbide mononitrate (IMDUR) 24 hr tablet 60 mg   60 mg Oral Daily Lennette Bihari, MD   60 mg at 03/04/11 0952  . morphine 2 MG/ML injection 2 mg  2 mg Intravenous Q2H PRN Lennette Bihari, MD   2 mg at 03/03/11 1351  . multivitamins ther. w/minerals tablet 1 tablet  1 tablet Oral Daily Leone Brand, NP   1 tablet at 03/04/11 [phone]  . ondansetron (ZOFRAN) injection 4 mg  4 mg Intravenous Q6H PRN Lennette Bihari, MD      . pantoprazole (PROTONIX) EC tablet 40 mg  40 mg Oral Q1200 Arman Filter, RPH   40 mg at 03/04/11 1300  . simvastatin (ZOCOR) tablet 20 mg  20 mg Oral q1800 Leone Brand, NP   20 mg at 03/04/11 1812  . sodium chloride 0.9 % injection 3 mL  3 mL Intravenous PRN [name], MD      . sodium chloride 0.9 % injection 3 mL  3 mL Intravenous Q12H Eda Paschal [place], PA   3 mL at 03/04/11 2251  . sodium chloride 0.9 % injection 3 mL  3 mL Intravenous PRN Abelino Derrick, PA      .  temazepam (RESTORIL) capsule 30 mg  30 mg Oral QHS Eda Paschal [place], [place]   30 mg at 03/04/11 2248  . zolpidem (AMBIEN) tablet 5 mg  5 mg Oral QHS PRN Lennette Bihari, MD      . DISCONTD: 0.9 %  sodium chloride infusion   Intravenous Continuous Lennette Bihari, MD 125 mL/hr at 03/03/11 2352    . DISCONTD: nitroGLYCERIN 0.2 mg/mL in dextrose 5 % infusion  2-200 mcg/min Intravenous Continuous Lennette Bihari, MD 9 mL/hr at 03/02/11 2120 30 mcg/min at 03/02/11 2120    PE: General appearance: alert, cooperative and no distress Lungs: clear to auscultation bilaterally and No Rales Heart: regular rate and rhythm, S1, S2 normal, no murmur, click, rub or gallop Extremities: 1+ LEE. Pulses: 2+ dp and radials Groin:  Small amount of ecchymosis, No femoral bruit, nontender  Lab Results:   Basename 03/05/11 0635 03/04/11 0610 03/03/11 0500  WBC 4.0 4.4 3.6*  HGB 8.4* 8.3* 8.4*  HCT 25.0* 24.9* 24.2*  PLT 190 190 181   BMET  Basename 03/05/11 0635 03/03/11 0500  NA 143 140  K 3.9 3.6  CL 112 109  CO2 23 22  GLUCOSE 112* 60*  BUN 15 15  CREATININE 1.50*  1.37*  CALCIUM 8.8 8.4     Assessment/Plan  Principal Problem:  *CKD (chronic kidney disease) stage 3, GFR 30-59 ml/min Active Problems:  Angina at rest  CAD (coronary artery disease)  LV dysfunction  LBBB (left bundle branch block)  Hyperlipemia  Diabetes mellitus  Fractured angioplasty guidewire in chronically occluded left circumflex artery  Anemia  Plan:  Increased LEE.  No Lasix ordered.  Home dose 40mg  daily.   Cr slightly increased today.  Likely need to restart.  DC today with FU 1-2 weeks.  Hgb stable.    LOS: 6 days    HAGER,BRYAN W 03/05/2011 8:50 AM  I have seen and examined the patient along with Wilburt Finlay, PA. I have reviewed the chart, notes and new data.  I agree with Bryan's note.  Key new complaints: No further CP ambulated with Cardiac Rehab without angina.  Breathing OK Key examination changes: None-- agree with Bryan's exam Key new findings / data: Hgb stable, Cr ~1.5 noted.  Echo noted.  PLAN: He is relatively stable after attempted PCI.  Plan for no is med Rx. - on long acting Nitrate, BB, CCB; if Sx recur, consider Ranexa.  With patent LAD, would not refer for redo CABG unless unrelenting angina. Restart home lasix dose & ARB @ 1/2 dose.  D/c Home today - f/u with Dr. Allyson Sabal 1-2 wks, Check CBC & BMET next week pre-f/u visit.   Marykay Lex, M.D., M.S. [facility] [address] [place], [place]  [address]  [phone]  03/05/2011 1:58 PM

## 2011-03-06 MED FILL — Dextrose Inj 5%: INTRAVENOUS | Qty: 50 | Status: AC

## 2011-03-30 HISTORY — PX: OTHER SURGICAL HISTORY: SHX169

## 2011-04-13 ENCOUNTER — Encounter (HOSPITAL_COMMUNITY)
Admission: RE | Admit: 2011-04-13 | Discharge: 2011-04-13 | Disposition: A | Discharge status: Home / Self Care | Payer: Medicare Other | Source: Ambulatory Visit | Attending: Nephrology | Admitting: Nephrology

## 2011-04-13 DIAGNOSIS — D509 Iron deficiency anemia, unspecified: Secondary | ICD-10-CM | POA: Insufficient documentation

## 2011-04-13 MED ORDER — FERUMOXYTOL INJECTION 510 MG/17 ML
INTRAVENOUS | Status: AC
  Administered 2011-04-13: 510 mg via INTRAVENOUS
  Filled 2011-04-13: qty 17

## 2011-04-13 MED ORDER — FERUMOXYTOL INJECTION 510 MG/17 ML
510.0000 mg | INTRAVENOUS | Status: DC
Start: 2011-04-13 — End: 2011-04-14
  Administered 2011-04-13: 510 mg via INTRAVENOUS

## 2011-04-17 ENCOUNTER — Encounter (HOSPITAL_COMMUNITY)
Admission: RE | Admit: 2011-04-17 | Discharge: 2011-04-17 | Disposition: A | Discharge status: Home / Self Care | Payer: Medicare Other | Source: Ambulatory Visit | Attending: Nephrology | Admitting: Nephrology

## 2011-04-17 MED ORDER — FERUMOXYTOL INJECTION 510 MG/17 ML: 510.0000 mg | INTRAVENOUS | Status: DC

## 2011-04-17 MED ORDER — FERUMOXYTOL INJECTION 510 MG/17 ML
INTRAVENOUS | Status: AC
  Administered 2011-04-17: 510 mg via INTRAVENOUS
  Filled 2011-04-17: qty 17

## 2011-06-14 ENCOUNTER — Other Ambulatory Visit: Payer: Self-pay | Admitting: Gastroenterology

## 2011-12-19 ENCOUNTER — Other Ambulatory Visit: Payer: Self-pay | Admitting: Dermatology

## 2012-05-06 ENCOUNTER — Encounter: Payer: Self-pay | Admitting: *Deleted

## 2012-05-14 ENCOUNTER — Encounter: Payer: Self-pay | Admitting: *Deleted

## 2012-05-15 ENCOUNTER — Encounter: Payer: Self-pay | Admitting: *Deleted

## 2012-06-09 ENCOUNTER — Ambulatory Visit (INDEPENDENT_AMBULATORY_CARE_PROVIDER_SITE_OTHER): Payer: Medicare Other | Admitting: Surgery

## 2012-06-09 ENCOUNTER — Encounter (INDEPENDENT_AMBULATORY_CARE_PROVIDER_SITE_OTHER): Payer: Self-pay | Admitting: Surgery

## 2012-06-09 VITALS — BP 138/64 | HR 60 | Temp 97.2°F | Resp 16 | Ht 71.5 in | Wt 211.0 lb

## 2012-06-09 DIAGNOSIS — K432 Incisional hernia without obstruction or gangrene: Secondary | ICD-10-CM

## 2012-06-09 NOTE — Patient Instructions (Signed)
Hernia Repair with Laparoscope A hernia occurs when an internal organ pushes out through a weak spot in the belly (abdominal) wall muscles. Hernias most commonly occur in the groin and around the navel. Hernias can also occur through a cut by the surgeon (incision) after an abdominal operation. A hernia may be caused by:  Lifting heavy objects.  Prolonged coughing.  Straining to move your bowels. Hernias can often be pushed back into place (reduced). Most hernias tend to get worse over time. Problems occur when abdominal contents get stuck in the opening and the blood supply is blocked or impaired (incarcerated hernia). Because of these risks, you require surgery to repair the hernia. Your hernia will be repaired using a laparoscope. Laparoscopic surgery is a type of minimally invasive surgery. It does not involve making a typical surgical cut (incision) in the skin. A laparoscope is a telescope-like rod and lens system. It is usually connected to a video camera and a light source so your caregiver can clearly see the operative area. The instruments are inserted through  to  inch (5 mm or 10 mm) openings in the skin at specific locations. A working and viewing space is created by blowing a small amount of carbon dioxide gas into the abdominal cavity. The abdomen is essentially blown up like a balloon (insufflated). This elevates the abdominal wall above the internal organs like a dome. The carbon dioxide gas is common to the human body and can be absorbed by tissue and removed by the respiratory system. Once the repair is completed, the small incisions will be closed with either stitches (sutures) or staples (just like a paper stapler only this staple holds the skin together). LET YOUR CAREGIVERS KNOW ABOUT:  Allergies.  Medications taken including herbs, eye drops, over the counter medications, and creams.  Use of steroids (by mouth or creams).  Previous problems with anesthetics or  Novocaine.  Possibility of pregnancy, if this applies.  History of blood clots (thrombophlebitis).  History of bleeding or blood problems.  Previous surgery.  Other health problems. BEFORE THE PROCEDURE  Laparoscopy can be done either in a hospital or out-patient clinic. You may be given a mild sedative to help you relax before the procedure. Once in the operating room, you will be given a general anesthesia to make you sleep (unless you and your caregiver choose a different anesthetic).  AFTER THE PROCEDURE  After the procedure you will be watched in a recovery area. Depending on what type of hernia was repaired, you might be admitted to the hospital or you might go home the same day. With this procedure you may have less pain and scarring. This usually results in a quicker recovery and less risk of infection. HOME CARE INSTRUCTIONS   Bed rest is not required. You may continue your normal activities but avoid heavy lifting (more than 10 pounds) or straining.  Cough gently. If you are a smoker it is best to stop, as even the best hernia repair can break down with the continual strain of coughing.  Avoid driving until given the OK by your surgeon.  There are no dietary restrictions unless given otherwise.  TAKE ALL MEDICATIONS AS DIRECTED.  Only take over-the-counter or prescription medicines for pain, discomfort, or fever as directed by your caregiver. SEEK MEDICAL CARE IF:   There is increasing abdominal pain or pain in your incisions.  There is more bleeding from incisions, other than minimal spotting.  You feel light headed or faint.  You   develop an unexplained fever, chills, and/or an oral temperature above 102 F (38.9 C).  You have redness, swelling, or increasing pain in the wound.  Pus coming from wound.  A foul smell coming from the wound or dressings. SEEK IMMEDIATE MEDICAL CARE IF:   You develop a rash.  You have difficulty breathing.  You have any  allergic problems. MAKE SURE YOU:   Understand these instructions.  Will watch your condition.  Will get help right away if you are not doing well or get worse. Document Released: 03/12/2005 Document Revised: 06/04/2011 Document Reviewed: 02/09/2009 [facility] Patient Information 2013 [place], [place]. Hernia A hernia occurs when an internal organ pushes out through a weak spot in the abdominal wall. Hernias most commonly occur in the groin and around the navel. Hernias often can be pushed back into place (reduced). Most hernias tend to get worse over time. Some abdominal hernias can get stuck in the opening (irreducible or incarcerated hernia) and cannot be reduced. An irreducible abdominal hernia which is tightly squeezed into the opening is at risk for impaired blood supply (strangulated hernia). A strangulated hernia is a medical emergency. Because of the risk for an irreducible or strangulated hernia, surgery may be recommended to repair a hernia. CAUSES   Heavy lifting.  Prolonged coughing.  Straining to have a bowel movement.  A cut (incision) made during an abdominal surgery. HOME CARE INSTRUCTIONS   Bed rest is not required. You may continue your normal activities.  Avoid lifting more than 10 pounds (4.5 kg) or straining.  Cough gently. If you are a smoker it is best to stop. Even the best hernia repair can break down with the continual strain of coughing. Even if you do not have your hernia repaired, a cough will continue to aggravate the problem.  Do not wear anything tight over your hernia. Do not try to keep it in with an outside bandage or truss. These can damage abdominal contents if they are trapped within the hernia sac.  Eat a normal diet.  Avoid constipation. Straining over long periods of time will increase hernia size and encourage breakdown of repairs. If you cannot do this with diet alone, stool softeners may be used. SEEK IMMEDIATE MEDICAL CARE IF:   You have  a fever.  You develop increasing abdominal pain.  You feel nauseous or vomit.  Your hernia is stuck outside the abdomen, looks discolored, feels hard, or is tender.  You have any changes in your bowel habits or in the hernia that are unusual for you.  You have increased pain or swelling around the hernia.  You cannot push the hernia back in place by applying gentle pressure while lying down. MAKE SURE YOU:   Understand these instructions.  Will watch your condition.  Will get help right away if you are not doing well or get worse. Document Released: 03/12/2005 Document Revised: 06/04/2011 Document Reviewed: 10/30/2007 [facility] Patient Information 2013 [place], [place].

## 2012-06-09 NOTE — Progress Notes (Signed)
Patient ID: [name], male   DOB: 02/09/1933, 79 y.o.   MRN: [id number]  Chief Complaint  Patient presents with  . New Evaluation    eval abd wall hernia    HPI [name] is a 79 y.o. male.  Patient sent at request of Dr. Tiburcio Pea for incisional hernia. He has had it for a number of years. It causes intermittent pain. The pain is made worse with physical activity. No nausea or vomiting. Bowels are functioning well. He will go months without any symptoms. Lifting does not seem to make it better or worse. He notices a bulge just to the left of his umbilicus. HPI  Past Medical History  Diagnosis Date  . Angina at rest 02/27/2011  . CAD (coronary artery disease) 02/27/2011    Catheterized on 02/28/11, graft supplying marginal vessel was occluded, after 48 hours of hydration, a repeat cath was performed in an attempt at PCI 03/02/11 cath had an unsuccessful attempt at PCI to an occluded calcified circumflex vessel, Residual distal tip of the ChoICE PT moderate support wire at the subtotal occlusion. No change in the subtotal occlusion,improved retrograde collateralization  . LV dysfunction 02/27/2011  . CKD (chronic kidney disease) stage 3, GFR 30-59 ml/min 02/27/2011  . LBBB (left bundle branch block)   . LBBB (left bundle branch block) 02/27/2011  . Hypertension   . Hyperlipemia 02/27/2011  . Diabetes mellitus 02/27/2011    insulin dependent  . GERD (gastroesophageal reflux disease)   . CHF (congestive heart failure)   . Blood transfusion     no reaction to transfusion per patient  . Arthritis   . Myocardial infarction   . PVD (peripheral vascular disease)     Carotid Angiography was performed in 2007, no treatment delivered at that time. Right carotid endarterectomy in 2009, being followed by duplex ultrasound. Doppler remains stable.  [name] CAD (coronary artery disease)     PTCA of the right coronary artery in Feb. 1987, July 1987, and June 1990. Circumflex PTCA in July 1993,  followed by a RCA, PTCA in Sept. 1994. CABG in 1994x5 with a LIMA to the LAD, vein graft to the first diagonal, vein graft to the marginal and left circumflex coronary artery, and sequential graft to the PDA and PLA branches of the RCA  . HTN (hypertension)     Renal dopplers have been performed on a semi annual basis. Left renal artery demonstrated vessel narrowing with elevated velocities consistent with a 1-59% diameter reduction.   . carotid doppler     Left ECA : The proximal most aspect of the left external carotid artery demonstrates a peak systolic velocity of 493.9 cm/s. This is consistent with a 70-99% significant stenosis correlated with a 2007 angiogram.    Past Surgical History  Procedure Laterality Date  . Coronary artery bypass graft    . Spinal fusion      Family History  Problem Relation Age of Onset  . Cancer Mother     unknown to pt  . Cancer Father     pancreas  . Cancer Paternal Grandfather     prostate    Social History History  Substance Use Topics  . Smoking status: Never Smoker   . Smokeless tobacco: Never Used     Comment: quit smoking in the 1960's  . Alcohol Use: No    Allergies  Allergen Reactions  . Lisinopril Cough    Current Outpatient Prescriptions  Medication Sig Dispense Refill  . acetaminophen (  TYLENOL) 325 MG tablet Take 650 mg by mouth every 6 (six) hours as needed for pain.      [name] amLODipine (NORVASC) 10 MG tablet Take 10 mg by mouth daily.        [name] aspirin EC 81 MG tablet Take 81 mg by mouth at bedtime.        . carvedilol (COREG) 25 MG tablet Take 25 mg by mouth 2 (two) times daily with a meal.        . clopidogrel (PLAVIX) 75 MG tablet Take 1 tablet (75 mg total) by mouth daily.  30 tablet  10  . furosemide (LASIX) 40 MG tablet Take 40 mg by mouth daily.        [name] glipiZIDE (GLUCOTROL) 10 MG tablet Take 20 mg by mouth 2 (two) times daily.       . Glucosamine-Chondroitin (MOVE FREE PO) Take 1 tablet by mouth 2 (two) times daily.        . hydrALAZINE (APRESOLINE) 25 MG tablet Take 25 mg by mouth 2 (two) times daily.        [name] loratadine (CLARITIN) 10 MG tablet Take 10 mg by mouth daily.      . Multiple Vitamins-Minerals (MULTIVITAMINS THER. W/MINERALS) TABS Take 1 tablet by mouth daily.        [name] omeprazole (PRILOSEC) 20 MG capsule Take 20 mg by mouth 2 (two) times daily.       . pravastatin (PRAVACHOL) 40 MG tablet Take 40 mg by mouth at bedtime.         No current facility-administered medications for this visit.    Review of Systems Review of Systems  Constitutional: Negative.   HENT: Negative.   Eyes: Negative.   Respiratory: Negative.   Cardiovascular: Negative.   Gastrointestinal: Positive for abdominal pain.  Musculoskeletal: Negative.   Skin: Negative.   Allergic/Immunologic: Negative.   Neurological: Negative.   Hematological: Negative.   Psychiatric/Behavioral: Negative.     Blood pressure 138/64, pulse 60, temperature 97.2 F (36.2 C), temperature source Temporal, resp. rate 16, height 5' 11.5" (1.816 m), weight 211 lb (95.709 kg).  Physical Exam Physical Exam  Constitutional: He is oriented to person, place, and time. He appears well-developed and well-nourished.  HENT:  Head: Normocephalic and atraumatic.  Eyes: EOM are normal. Pupils are equal, round, and reactive to light.  Neck: Normal range of motion. Neck supple.  Cardiovascular: Normal rate and regular rhythm.   Pulmonary/Chest: Effort normal and breath sounds normal.  Abdominal:    Musculoskeletal: Normal range of motion.  Neurological: He is alert and oriented to person, place, and time.  Skin: Skin is warm and dry.  Psychiatric: He has a normal mood and affect. His behavior is normal. Judgment and thought content normal.      Assessment    Incisional hernia symptomatic reducible Patient Active Problem List  Diagnosis  . HYPERTENSION  . COUGH  . Angina at rest  . CAD (coronary artery disease)  . LV dysfunction  . CKD  (chronic kidney disease) stage 3, GFR 30-59 ml/min  . LBBB (left bundle branch block)  . Hyperlipemia  . Diabetes mellitus      Plan    Discuss hernia physiology and signs and symptoms of incarceration and strangulation with him today. Recommend repair when convenient for the patient. Laparoscopic approach offered. Risks, benefits and long term expectations as well as non-operative measures discussed. He is fairly active but like to wait until late fall or early next  winter to fix it as long as his pain is not worsened. He will contact me if symptoms worsen or seek emergent medical care.       Kailand Seda A. 06/09/2012, 11:20 AM

## 2012-06-18 ENCOUNTER — Other Ambulatory Visit: Payer: Self-pay | Admitting: Dermatology

## 2012-12-04 ENCOUNTER — Ambulatory Visit (INDEPENDENT_AMBULATORY_CARE_PROVIDER_SITE_OTHER): Payer: Medicare Other | Admitting: Cardiology

## 2012-12-04 ENCOUNTER — Encounter: Payer: Self-pay | Admitting: Cardiology

## 2012-12-04 VITALS — BP 122/56 | HR 77 | Ht 71.0 in | Wt 204.7 lb

## 2012-12-04 DIAGNOSIS — I2510 Atherosclerotic heart disease of native coronary artery without angina pectoris: Secondary | ICD-10-CM

## 2012-12-04 DIAGNOSIS — I447 Left bundle-branch block, unspecified: Secondary | ICD-10-CM

## 2012-12-04 DIAGNOSIS — I739 Peripheral vascular disease, unspecified: Secondary | ICD-10-CM

## 2012-12-04 DIAGNOSIS — N183 Chronic kidney disease, stage 3 unspecified: Secondary | ICD-10-CM

## 2012-12-04 DIAGNOSIS — I519 Heart disease, unspecified: Secondary | ICD-10-CM

## 2012-12-04 DIAGNOSIS — N184 Chronic kidney disease, stage 4 (severe): Secondary | ICD-10-CM | POA: Insufficient documentation

## 2012-12-04 NOTE — Progress Notes (Signed)
12/04/2012 [name]   12/29/1932  [id number]  Primary Physicia No primary provider on file. Primary Cardiologist: Dr Allyson Sabal  HPI:  Pleasant 79 y/o followed by Dr Allyson Sabal with a history of CAD. He had CABG in '94. In December 2012 he had angina and underwent an attempted PCI to the CFX but the tip of the catheter broke off and the procedure was aborted. Fortunately he has done well since. He had a Myoview Jan 2013 that was low risk. His EF at that time was 42%. He is here today for a 6 month check. He has been active at home, building a screened in porch this summer. Last week he had an accident using a drill press and broke his Lt wrist. He denies angina but admits to occasional dizziness but denies any syncope or pre syncope. He feels its related to his diabetes. He says his B/P has been well controlled at home. His las BUN/SCr was a little elevated an I considered cutting back on his Lasix but he says he has lower extremity edema and Dr Abel Presto follows his renal function closely. I also considered possibly cutting his Coreg back a little but he has no symptoms that I can attribute to bradycardia. [name] now, same Rx and follow up in 6 months.   Current Outpatient Prescriptions  Medication Sig Dispense Refill  . acetaminophen (TYLENOL) 325 MG tablet Take 650 mg by mouth every 6 (six) hours as needed for pain.      [name] amLODipine (NORVASC) 10 MG tablet Take 10 mg by mouth daily.        [name] aspirin EC 81 MG tablet Take 81 mg by mouth at bedtime.        . carvedilol (COREG) 25 MG tablet Take 25 mg by mouth 2 (two) times daily with a meal.        . clopidogrel (PLAVIX) 75 MG tablet Take 1 tablet (75 mg total) by mouth daily.  30 tablet  10  . furosemide (LASIX) 40 MG tablet Take 40 mg by mouth daily.        [name] glipiZIDE (GLUCOTROL) 10 MG tablet Take 20 mg by mouth 2 (two) times daily.       . Glucosamine-Chondroitin (MOVE FREE PO) Take 1 tablet by mouth 2 (two) times daily.       . hydrALAZINE  (APRESOLINE) 25 MG tablet Take 25 mg by mouth 2 (two) times daily.        [name] loratadine (CLARITIN) 10 MG tablet Take 10 mg by mouth daily.      . Multiple Vitamins-Minerals (MULTIVITAMINS THER. W/MINERALS) TABS Take 1 tablet by mouth daily.        [name] omeprazole (PRILOSEC) 20 MG capsule Take 20 mg by mouth 2 (two) times daily.       . pravastatin (PRAVACHOL) 40 MG tablet Take 40 mg by mouth at bedtime.         No current facility-administered medications for this visit.    Allergies  Allergen Reactions  . Lisinopril Cough    History   Social History  . Marital Status: Married    Spouse Name: N/A    Number of Children: N/A  . Years of Education: N/A   Occupational History  . Not on file.   Social History Main Topics  . Smoking status: Never Smoker   . Smokeless tobacco: Never Used     Comment: quit smoking in the 1960's  . Alcohol Use: No  . Drug  Use: No  . Sexual Activity: Yes   Other Topics Concern  . Not on file   Social History Narrative  . No narrative on file     Review of Systems: General: negative for chills, fever, night sweats or weight changes.  Cardiovascular: negative for chest pain, dyspnea on exertion, edema, orthopnea, palpitations, paroxysmal nocturnal dyspnea or shortness of breath Dermatological: negative for rash Respiratory: negative for cough or wheezing Urologic: negative for hematuria Abdominal: negative for nausea, vomiting, diarrhea, bright red blood per rectum, melena, or hematemesis Neurologic: negative for visual changes, syncope All other systems reviewed and are otherwise negative except as noted above. Wears a hearing aid in his Lt ear    Blood pressure 122/56, pulse 77, height 5\' 11"  (1.803 m), weight 204 lb 11.2 oz (92.851 kg).  General appearance: alert, cooperative and no distress Lungs: fine crackles at bases Heart: regular rate and rhythm Extremities: tarce edema  EKG NSR, LBBB  ASSESSMENT AND PLAN:   LV dysfunction- EF  43% by Myoview Jan 2013 No CHF symptoms  CAD (coronary artery disease)- CABG '94. PCI attempt 2012, Myoview low risk Jan 2013 No angina  CKD (chronic kidney disease) stage 3, GFR 30-59 ml/min Last SCr 1.28 September 2012  LBBB (left bundle branch block) .    PLAN  Same Rx, follow up in 6 months  [name] KPA-C 12/04/2012 10:02 AM

## 2012-12-04 NOTE — Assessment & Plan Note (Signed)
Last SCr 1.28 September 2012

## 2012-12-04 NOTE — Patient Instructions (Signed)
Your physician recommends that you schedule a follow-up appointment in: 6 months  

## 2012-12-04 NOTE — Assessment & Plan Note (Signed)
No angina 

## 2012-12-04 NOTE — Assessment & Plan Note (Signed)
No CHF symptoms 

## 2012-12-05 ENCOUNTER — Encounter: Payer: Self-pay | Admitting: Cardiovascular Disease

## 2012-12-22 ENCOUNTER — Inpatient Hospital Stay (HOSPITAL_COMMUNITY)
Admission: EM | Admit: 2012-12-22 | Discharge: 2012-12-25 | DRG: 389 | Disposition: A | Discharge status: Home / Self Care | Payer: Medicare Other | Attending: Surgery | Admitting: Surgery

## 2012-12-22 ENCOUNTER — Emergency Department (HOSPITAL_COMMUNITY): Payer: Medicare Other

## 2012-12-22 ENCOUNTER — Encounter (HOSPITAL_COMMUNITY): Payer: Self-pay | Admitting: *Deleted

## 2012-12-22 DIAGNOSIS — I2510 Atherosclerotic heart disease of native coronary artery without angina pectoris: Secondary | ICD-10-CM

## 2012-12-22 DIAGNOSIS — K565 Intestinal adhesions [bands], unspecified as to partial versus complete obstruction: Principal | ICD-10-CM | POA: Diagnosis present

## 2012-12-22 DIAGNOSIS — I739 Peripheral vascular disease, unspecified: Secondary | ICD-10-CM

## 2012-12-22 DIAGNOSIS — Z7982 Long term (current) use of aspirin: Secondary | ICD-10-CM

## 2012-12-22 DIAGNOSIS — I509 Heart failure, unspecified: Secondary | ICD-10-CM | POA: Diagnosis present

## 2012-12-22 DIAGNOSIS — I209 Angina pectoris, unspecified: Secondary | ICD-10-CM

## 2012-12-22 DIAGNOSIS — I255 Ischemic cardiomyopathy: Secondary | ICD-10-CM

## 2012-12-22 DIAGNOSIS — Z7902 Long term (current) use of antithrombotics/antiplatelets: Secondary | ICD-10-CM

## 2012-12-22 DIAGNOSIS — I4891 Unspecified atrial fibrillation: Secondary | ICD-10-CM | POA: Diagnosis present

## 2012-12-22 DIAGNOSIS — E785 Hyperlipidemia, unspecified: Secondary | ICD-10-CM | POA: Diagnosis present

## 2012-12-22 DIAGNOSIS — Z23 Encounter for immunization: Secondary | ICD-10-CM

## 2012-12-22 DIAGNOSIS — N2889 Other specified disorders of kidney and ureter: Secondary | ICD-10-CM

## 2012-12-22 DIAGNOSIS — Z951 Presence of aortocoronary bypass graft: Secondary | ICD-10-CM

## 2012-12-22 DIAGNOSIS — N184 Chronic kidney disease, stage 4 (severe): Secondary | ICD-10-CM | POA: Diagnosis present

## 2012-12-22 DIAGNOSIS — Z981 Arthrodesis status: Secondary | ICD-10-CM

## 2012-12-22 DIAGNOSIS — I252 Old myocardial infarction: Secondary | ICD-10-CM

## 2012-12-22 DIAGNOSIS — Z8042 Family history of malignant neoplasm of prostate: Secondary | ICD-10-CM

## 2012-12-22 DIAGNOSIS — Z800 Family history of malignant neoplasm of digestive organs: Secondary | ICD-10-CM

## 2012-12-22 DIAGNOSIS — Z79899 Other long term (current) drug therapy: Secondary | ICD-10-CM

## 2012-12-22 DIAGNOSIS — K56609 Unspecified intestinal obstruction, unspecified as to partial versus complete obstruction: Secondary | ICD-10-CM

## 2012-12-22 DIAGNOSIS — I129 Hypertensive chronic kidney disease with stage 1 through stage 4 chronic kidney disease, or unspecified chronic kidney disease: Secondary | ICD-10-CM | POA: Diagnosis present

## 2012-12-22 DIAGNOSIS — I10 Essential (primary) hypertension: Secondary | ICD-10-CM | POA: Diagnosis present

## 2012-12-22 DIAGNOSIS — N183 Chronic kidney disease, stage 3 unspecified: Secondary | ICD-10-CM

## 2012-12-22 DIAGNOSIS — N289 Disorder of kidney and ureter, unspecified: Secondary | ICD-10-CM

## 2012-12-22 DIAGNOSIS — I5032 Chronic diastolic (congestive) heart failure: Secondary | ICD-10-CM | POA: Diagnosis present

## 2012-12-22 DIAGNOSIS — I208 Other forms of angina pectoris: Secondary | ICD-10-CM

## 2012-12-22 DIAGNOSIS — K219 Gastro-esophageal reflux disease without esophagitis: Secondary | ICD-10-CM | POA: Diagnosis present

## 2012-12-22 DIAGNOSIS — I2089 Other forms of angina pectoris: Secondary | ICD-10-CM

## 2012-12-22 DIAGNOSIS — I519 Heart disease, unspecified: Secondary | ICD-10-CM

## 2012-12-22 DIAGNOSIS — E119 Type 2 diabetes mellitus without complications: Secondary | ICD-10-CM

## 2012-12-22 DIAGNOSIS — I447 Left bundle-branch block, unspecified: Secondary | ICD-10-CM

## 2012-12-22 LAB — COMPREHENSIVE METABOLIC PANEL
ALT: 13 U/L (ref 0–53)
Alkaline Phosphatase: 60 U/L (ref 39–117)
CO2: 21 mEq/L (ref 19–32)
GFR calc Af Amer: 43 mL/min — ABNORMAL LOW (ref >90)
GFR calc non Af Amer: 37 mL/min — ABNORMAL LOW (ref >90)
Glucose, Bld: 227 mg/dL — ABNORMAL HIGH (ref 70–99)
Potassium: 5.2 mEq/L — ABNORMAL HIGH (ref 3.5–5.1)
Sodium: 139 mEq/L (ref 135–145)

## 2012-12-22 LAB — CBC WITH DIFFERENTIAL/PLATELET
Lymphocytes Relative: 13 % (ref 12–46)
Lymphs Abs: 1.3 10*3/uL (ref 0.7–4.0)
MCV: 94.4 fL (ref 78.0–100.0)
Neutrophils Relative %: 79 % — ABNORMAL HIGH (ref 43–77)
Platelets: 194 10*3/uL (ref 150–400)
RBC: 3.38 MIL/uL — ABNORMAL LOW (ref 4.22–5.81)
WBC: 10 10*3/uL (ref 4.0–10.5)

## 2012-12-22 LAB — HEMOGLOBIN A1C
Hgb A1c MFr Bld: 6.7 % — ABNORMAL HIGH (ref <5.7)
Mean Plasma Glucose: 146 mg/dL — ABNORMAL HIGH (ref <117)

## 2012-12-22 LAB — URINE MICROSCOPIC-ADD ON

## 2012-12-22 LAB — CBC
HCT: 33.1 % — ABNORMAL LOW (ref 39.0–52.0)
MCH: 32.4 pg (ref 26.0–34.0)
MCHC: 34.1 g/dL (ref 30.0–36.0)
MCV: 94.8 fL (ref 78.0–100.0)
Platelets: 189 10*3/uL (ref 150–400)
RDW: 13 % (ref 11.5–15.5)
WBC: 9.6 10*3/uL (ref 4.0–10.5)

## 2012-12-22 LAB — URINALYSIS, ROUTINE W REFLEX MICROSCOPIC
Bilirubin Urine: NEGATIVE
Hgb urine dipstick: NEGATIVE
Protein, ur: 100 mg/dL — AB
Urobilinogen, UA: 0.2 mg/dL (ref 0.0–1.0)

## 2012-12-22 LAB — CREATININE, SERUM
GFR calc Af Amer: 49 mL/min — ABNORMAL LOW (ref >90)
GFR calc non Af Amer: 42 mL/min — ABNORMAL LOW (ref >90)

## 2012-12-22 LAB — GLUCOSE, CAPILLARY: Glucose-Capillary: 125 mg/dL — ABNORMAL HIGH (ref 70–99)

## 2012-12-22 LAB — POCT I-STAT TROPONIN I

## 2012-12-22 MED ORDER — METOPROLOL TARTRATE 1 MG/ML IV SOLN
5.0000 mg | Freq: Four times a day (QID) | INTRAVENOUS | Status: DC
  Administered 2012-12-22 – 2012-12-23 (×5): 5 mg via INTRAVENOUS
  Filled 2012-12-22 (×6): qty 5

## 2012-12-22 MED ORDER — PANTOPRAZOLE SODIUM 40 MG IV SOLR
40.0000 mg | Freq: Every day | INTRAVENOUS | Status: DC
  Administered 2012-12-22 – 2012-12-24 (×3): 40 mg via INTRAVENOUS
  Filled 2012-12-22 (×5): qty 40

## 2012-12-22 MED ORDER — ONDANSETRON HCL 4 MG/2ML IJ SOLN
4.0000 mg | Freq: Once | INTRAMUSCULAR | Status: AC
  Administered 2012-12-22: 4 mg via INTRAVENOUS
  Filled 2012-12-22: qty 2

## 2012-12-22 MED ORDER — MORPHINE SULFATE 4 MG/ML IJ SOLN
4.0000 mg | Freq: Once | INTRAMUSCULAR | Status: AC
  Administered 2012-12-22: 4 mg via INTRAVENOUS
  Filled 2012-12-22: qty 1

## 2012-12-22 MED ORDER — NITROGLYCERIN 2 % TD OINT
0.5000 [in_us] | TOPICAL_OINTMENT | Freq: Four times a day (QID) | TRANSDERMAL | Status: DC
  Administered 2012-12-22 – 2012-12-25 (×11): 0.5 [in_us] via TOPICAL
  Filled 2012-12-22 (×29): qty 30

## 2012-12-22 MED ORDER — DIPHENHYDRAMINE HCL 50 MG/ML IJ SOLN: 12.5000 mg | Freq: Four times a day (QID) | INTRAMUSCULAR | Status: DC | PRN

## 2012-12-22 MED ORDER — MORPHINE SULFATE 2 MG/ML IJ SOLN
INTRAMUSCULAR | Status: AC
  Filled 2012-12-22: qty 1

## 2012-12-22 MED ORDER — METOPROLOL TARTRATE 1 MG/ML IV SOLN: 5.0000 mg | INTRAVENOUS | Status: DC | PRN

## 2012-12-22 MED ORDER — ONDANSETRON HCL 4 MG/2ML IJ SOLN
4.0000 mg | Freq: Four times a day (QID) | INTRAMUSCULAR | Status: DC | PRN
  Administered 2012-12-22: 4 mg via INTRAVENOUS
  Filled 2012-12-22: qty 2

## 2012-12-22 MED ORDER — SODIUM CHLORIDE 0.9 % IV BOLUS (SEPSIS)
500.0000 mL | Freq: Once | INTRAVENOUS | Status: AC
  Administered 2012-12-22: 500 mL via INTRAVENOUS

## 2012-12-22 MED ORDER — ENOXAPARIN SODIUM 40 MG/0.4ML ~~LOC~~ SOLN
40.0000 mg | SUBCUTANEOUS | Status: DC
  Administered 2012-12-22: 40 mg via SUBCUTANEOUS
  Filled 2012-12-22 (×2): qty 0.4

## 2012-12-22 MED ORDER — DIPHENHYDRAMINE HCL 12.5 MG/5ML PO ELIX: 12.5000 mg | ORAL_SOLUTION | Freq: Four times a day (QID) | ORAL | Status: DC | PRN

## 2012-12-22 MED ORDER — LORAZEPAM 2 MG/ML IJ SOLN
0.5000 mg | Freq: Every evening | INTRAMUSCULAR | Status: DC | PRN
  Administered 2012-12-22: 0.5 mg via INTRAVENOUS
  Administered 2012-12-23: 1 mg via INTRAVENOUS
  Administered 2012-12-24: 0.5 mg via INTRAVENOUS
  Filled 2012-12-22 (×3): qty 1

## 2012-12-22 MED ORDER — MORPHINE SULFATE 2 MG/ML IJ SOLN
1.0000 mg | INTRAMUSCULAR | Status: DC | PRN
  Administered 2012-12-22 (×2): 2 mg via INTRAVENOUS
  Filled 2012-12-22: qty 1

## 2012-12-22 MED ORDER — IOHEXOL 300 MG/ML  SOLN
25.0000 mL | INTRAMUSCULAR | Status: AC
  Administered 2012-12-22 (×2): 25 mL via ORAL

## 2012-12-22 MED ORDER — SODIUM CHLORIDE 0.9 % IV SOLN
INTRAVENOUS | Status: DC
  Administered 2012-12-22 – 2012-12-24 (×4): via INTRAVENOUS

## 2012-12-22 MED ORDER — HYDRALAZINE HCL 20 MG/ML IJ SOLN: 10.0000 mg | Freq: Four times a day (QID) | INTRAMUSCULAR | Status: DC | PRN

## 2012-12-22 MED ORDER — INSULIN ASPART 100 UNIT/ML ~~LOC~~ SOLN
0.0000 [IU] | SUBCUTANEOUS | Status: DC
  Administered 2012-12-22: 1 [IU] via SUBCUTANEOUS
  Administered 2012-12-22 – 2012-12-23 (×2): 2 [IU] via SUBCUTANEOUS
  Filled 2012-12-22: qty 1

## 2012-12-22 NOTE — ED Notes (Signed)
[facility] PA at bedside to speak with pt.

## 2012-12-22 NOTE — ED Notes (Signed)
Admitting physician at bedside

## 2012-12-22 NOTE — H&P (Signed)
[name] [birth year]  [id number].   Chief Complaint/Reason for Consult: SBO HPI: This is a 79 yo male with multiple medical problems who began having left sided abdominal pain yesterday.  He then developed nausea and vomiting today with continued abdomina discomfort and bloating.  He had a BM this morning.  He came to the ED where he has a CT scan that revealed a SBO with transition point.  We have been asked to see for further management.  Review of Systems: Please see HPI, otherwise negative except for extremity swelling for the last 48 hrs.  Family History  Problem Relation Age of Onset  . Cancer Mother     unknown to pt  . Cancer Father     pancreas  . Cancer Paternal Grandfather     prostate    Past Medical History  Diagnosis Date  . Angina at rest 02/27/2011  . CAD (coronary artery disease) 02/27/2011    Catheterized on 02/28/11, graft supplying marginal vessel was occluded, after 48 hours of hydration, a repeat cath was performed in an attempt at PCI 03/02/11 cath had an unsuccessful attempt at PCI to an occluded calcified circumflex vessel, Residual distal tip of the ChoICE PT moderate support wire at the subtotal occlusion. No change in the subtotal occlusion,improved retrograde collateralization  . LV dysfunction 02/27/2011  . CKD (chronic kidney disease) stage 3, GFR 30-59 ml/min 02/27/2011  . LBBB (left bundle branch block) 02/27/2011  . Hypertension   . Hyperlipemia 02/27/2011  . Diabetes mellitus 02/27/2011    insulin dependent  . GERD (gastroesophageal reflux disease)   . CHF (congestive heart failure)   . Blood transfusion     no reaction to transfusion per patient  . Arthritis   . Myocardial infarction   . PVD (peripheral vascular disease)     Carotid Angiography was performed in 2007, no treatment delivered at that time. Right carotid endarterectomy in 2009, being followed by duplex ultrasound. Doppler remains stable.  [name] CAD (coronary artery disease)     PTCA  of the right coronary artery in Feb. 1987, July 1987, and June 1990. Circumflex PTCA in July 1993, followed by a RCA, PTCA in Sept. 1994. CABG in 1994x5 with a LIMA to the LAD, vein graft to the first diagonal, vein graft to the marginal and left circumflex coronary artery, and sequential graft to the PDA and PLA branches of the RCA  . HTN (hypertension)     Renal dopplers have been performed on a semi annual basis. Left renal artery demonstrated vessel narrowing with elevated velocities consistent with a 1-59% diameter reduction.   . carotid doppler     Left ECA : The proximal most aspect of the left external carotid artery demonstrates a peak systolic velocity of 493.9 cm/s. This is consistent with a 70-99% significant stenosis correlated with a 2007 angiogram.    Past Surgical History  Procedure Laterality Date  . Coronary artery bypass graft    . Spinal fusion    . Back surgery      Social History:  reports that he has never smoked. He has never used smokeless tobacco. He reports that he does not drink alcohol or use illicit drugs.  Allergies:  Allergies  Allergen Reactions  . Lisinopril Cough     (Not in a hospital admission)  Blood pressure 161/56, pulse 70, temperature 98.4 F (36.9 C), temperature source Oral, resp. rate 19, SpO2 94.00%. Physical Exam: General: pleasant, WD, WN white male who is laying  in bed in NAD HEENT: head is normocephalic, atraumatic.  Sclera are noninjected.  PERRL.  Ears and nose without any masses or lesions.  Mouth is pink and moist Heart: regular, rate, and rhythm.  Normal s1,s2. No obvious murmurs, gallops, or rubs noted.  Palpable radial and pedal pulses bilaterally Lungs: CTAB, no wheezes, rhonchi, or rales noted.  Respiratory effort nonlabored Abd: soft, mild LUQ abdominal tenderness, mild protrusion on the left side of abdomen, but no hernia noted, minimal distention, hypoactive BS, no masses, hernias, or organomegaly, NGT with 400cc of clear,  CT contrast MS: all 4 extremities are symmetrical with no cyanosis, clubbing. +2 pitting lower extremity edema Skin: warm and dry with no masses, lesions, or rashes Psych: A&Ox3 with an appropriate affect.    Results for orders placed during the hospital encounter of 12/22/12 (from the past 48 hour(s))  CBC WITH DIFFERENTIAL     Status: Abnormal   Collection Time    12/22/12  9:34 AM      Result Value Range   WBC 10.0  4.0 - 10.5 K/uL   RBC 3.38 (*) 4.22 - 5.81 MIL/uL   Hemoglobin 11.0 (*) 13.0 - 17.0 g/dL   HCT 16.1 (*) 09.6 - 04.5 %   MCV 94.4  78.0 - 100.0 fL   MCH 32.5  26.0 - 34.0 pg   MCHC 34.5  30.0 - 36.0 g/dL   RDW 40.9  81.1 - 91.4 %   Platelets 194  150 - 400 K/uL   Neutrophils Relative % 79 (*) 43 - 77 %   Neutro Abs 7.9 (*) 1.7 - 7.7 K/uL   Lymphocytes Relative 13  12 - 46 %   Lymphs Abs 1.3  0.7 - 4.0 K/uL   Monocytes Relative 6  3 - 12 %   Monocytes Absolute 0.6  0.1 - 1.0 K/uL   Eosinophils Relative 1  0 - 5 %   Eosinophils Absolute 0.1  0.0 - 0.7 K/uL   Basophils Relative 0  0 - 1 %   Basophils Absolute 0.0  0.0 - 0.1 K/uL  COMPREHENSIVE METABOLIC PANEL     Status: Abnormal   Collection Time    12/22/12  9:34 AM      Result Value Range   Sodium 139  135 - 145 mEq/L   Potassium 5.2 (*) 3.5 - 5.1 mEq/L   Chloride 105  96 - 112 mEq/L   CO2 21  19 - 32 mEq/L   Glucose, Bld 227 (*) 70 - 99 mg/dL   BUN 49 (*) 6 - 23 mg/dL   Creatinine, Ser 7.82 (*) 0.50 - 1.35 mg/dL   Calcium 9.5  8.4 - 95.6 mg/dL   Total Protein 7.4  6.0 - 8.3 g/dL   Albumin 4.3  3.5 - 5.2 g/dL   AST 16  0 - 37 U/L   ALT 13  0 - 53 U/L   Alkaline Phosphatase 60  39 - 117 U/L   Total Bilirubin 0.5  0.3 - 1.2 mg/dL   GFR calc non Af Amer 37 (*) >90 mL/min   GFR calc Af Amer 43 (*) >90 mL/min   Comment: (NOTE)     The eGFR has been calculated using the CKD EPI equation.     This calculation has not been validated in all clinical situations.     eGFR's persistently <90 mL/min signify  possible Chronic Kidney     Disease.  LIPASE, BLOOD     Status: None  Collection Time    12/22/12  9:34 AM      Result Value Range   Lipase 25  11 - 59 U/L  POCT I-STAT TROPONIN I     Status: None   Collection Time    12/22/12  9:47 AM      Result Value Range   Troponin i, poc 0.00  0.00 - 0.08 ng/mL   Comment 3            Comment: Due to the release kinetics of cTnI,     a negative result within the first hours     of the onset of symptoms does not rule out     myocardial infarction with certainty.     If myocardial infarction is still suspected,     repeat the test at appropriate intervals.  URINALYSIS, ROUTINE W REFLEX MICROSCOPIC     Status: Abnormal   Collection Time    12/22/12 12:34 PM      Result Value Range   Color, Urine YELLOW  YELLOW   APPearance CLEAR  CLEAR   Specific Gravity, Urine 1.020  1.005 - 1.030   pH 5.5  5.0 - 8.0   Glucose, UA 100 (*) NEGATIVE mg/dL   Hgb urine dipstick NEGATIVE  NEGATIVE   Bilirubin Urine NEGATIVE  NEGATIVE   Ketones, ur NEGATIVE  NEGATIVE mg/dL   Protein, ur 161 (*) NEGATIVE mg/dL   Urobilinogen, UA 0.2  0.0 - 1.0 mg/dL   Nitrite NEGATIVE  NEGATIVE   Leukocytes, UA NEGATIVE  NEGATIVE  URINE MICROSCOPIC-ADD ON     Status: None   Collection Time    12/22/12 12:34 PM      Result Value Range   Squamous Epithelial / LPF RARE  RARE   WBC, UA 0-2  <3 WBC/hpf   RBC / HPF 0-2  <3 RBC/hpf  GLUCOSE, CAPILLARY     Status: Abnormal   Collection Time    12/22/12  1:58 PM      Result Value Range   Glucose-Capillary 161 (*) 70 - 99 mg/dL   Ct Abdomen Pelvis Wo Contrast  12/22/2012   CLINICAL DATA:  Abdominal pain, vomiting, history of bypass surgery  EXAM: CT ABDOMEN AND PELVIS WITHOUT CONTRAST  TECHNIQUE: Multidetector CT imaging of the abdomen and pelvis was performed following the standard protocol without intravenous contrast.  COMPARISON:  None.  FINDINGS: Lung bases are unremarkable.  Sagittal images of the spine shows postsurgical  changes at L2, L3, L4 and L5 level. There is mild dextroscoliosis of the lumbar spine. Degenerative changes are noted lumbar spine.  Unenhanced liver shows no biliary ductal dilatation. At least 2 calcified gallstones are noted within gallbladder neck region the largest measures 8.4 mm. No pericholecystic fluid.  The study is limited without IV contrast. Atherosclerotic calcifications of abdominal aorta and iliac arteries. No aortic aneurysm. No nephrolithiasis. No hydronephrosis or hydroureter. Unenhanced kidneys shows mild perinephric stranding. There is bilateral renal cortical thinning and cortical scarring probable due to atrophy.  Some contrast material noted in proximal stomach.  There are distended small bowel loops in left upper abdomen with multiple air-fluid levels. In axial image 40 there is a distended small bowel loops with fecal like material. After this point there is small caliber of the small bowel as seen in coronal image 22. Findings are highly suspicious for small bowel obstruction. The distal small bowel is empty small caliber. No any contrast material noted in distal small bowel or within colon. There  is some stool in the right colon. Moderate stool noted in sigmoid colon. Sigmoid colon diverticula are noted. No evidence of acute diverticulitis. Mild enlarged prostate gland with indentation of urinary bladder base. Mild distended urinary bladder. Pelvic phleboliths are noted. There is no pericecal inflammation. Normal appendix is clearly visualize in axial image 61.  IMPRESSION: 1. There are distended small bowel loops in left upper abdomen with multiple air-fluid levels. There is transition point in caliber of the small bowel in axial image 41. Findings are highly suspicious for small bowel obstruction. 2. At least 2 calcified gallstones are noted within gallbladder the largest measures 8.2 mm. 3. Postsurgical changes lumbar spine. 4. No ascites or free air. 5. Moderate stool noted in sigmoid  colon. Multiple sigmoid colon diverticula. No evidence of acute diverticulitis. 6. There is an enlarged prostate gland with mild indentation of urinary bladder base. 7. No pericecal inflammation. Normal appendix is partially visualized.   Electronically Signed   By: Natasha Mead   On: 12/22/2012 12:53       Assessment/Plan 1. SBO Patient Active Problem List   Diagnosis Date Noted  . DM2 (diabetes mellitus, type 2) 12/22/2012  . SBO (small bowel obstruction) 12/22/2012  . PVD - RCE '09 12/04/2012  . Chronic renal insufficiency, stage III (moderate)- follwed by Dr Abel Presto 12/04/2012  . Angina at rest 02/27/2011  . CAD (coronary artery disease)- CABG '94. PCI attempt 2012, Myoview low risk Jan 2013 02/27/2011  . LV dysfunction- EF 43% by Myoview Jan 2013 02/27/2011  . CKD (chronic kidney disease) stage 3, GFR 30-59 ml/min 02/27/2011  . LBBB (left bundle branch block) 02/27/2011  . Hyperlipemia 02/27/2011  . Diabetes mellitus 02/27/2011  . HYPERTENSION 12/06/2008  . COUGH 12/06/2008   Plan: 1. The patient will be admitted and placed on bowel rest and conservative management with NPO and NGT decompression.  His plavix will need to be held in case he were to need an operation. 2. Medicine has been consulted to help [place] with his multiple other co-morbidities 3. Repeat abdominal films in the morning. 4. IVFs while NPO, hopefully this will help his elevated Cr as well.  Takyia Sindt E 12/22/2012, 2:27 PM Pager: [phone]

## 2012-12-22 NOTE — Consult Note (Signed)
[facility] CONSULT NOTE  [name] ZOX:[id number] DOB: 1932/07/27 DOA: 12/22/2012   Referring physician:  PCP: Dr. Holley Bouche  Specialists: Dr. [facility]  Chief Complaint: Abdominal Pain    HPI: [name] is a 79 y.o. male with a PMH significant for CAD, CKD, HTN, Hyperlipidemia, DM, CHF who presented to the ED with a complaints of severe abdominal pain located along his belt line. Patient reported pain started yesterday afternoon and increased throughout the night. Pt. Noted significant edema that has been developing in his lower extremities for the past 2 days. He has doubled his Lasix dosing during that time without improvement. One episode of vomiting this morning before coming to ED.   Review of Systems: Patient denies fever, chills, CP, SOB with exertion, no weight loss. All other systems were reviewed and found to be negative.   Past Medical History   Diagnosis  Date   .  Angina at rest  02/27/2011   .  CAD (coronary artery disease)  02/27/2011     Catheterized on 02/28/11, graft supplying marginal vessel was occluded, after 48 hours of hydration, a repeat cath was performed in an attempt at PCI 03/02/11 cath had an unsuccessful attempt at PCI to an occluded calcified circumflex vessel, Residual distal tip of the ChoICE PT moderate support wire at the subtotal occlusion. No change in the subtotal occlusion,improved retrograde collateralization   .  LV dysfunction  02/27/2011   .  CKD (chronic kidney disease) stage 3, GFR 30-59 ml/min  02/27/2011   .  LBBB (left bundle branch block)  02/27/2011   .  Hypertension    .  Hyperlipemia  02/27/2011   .  Diabetes mellitus  02/27/2011     insulin dependent   .  GERD (gastroesophageal reflux disease)    .  CHF (congestive heart failure)    .  Blood transfusion      no reaction to transfusion per patient   .  Arthritis    .  Myocardial infarction    .  PVD (peripheral vascular disease)      Carotid  Angiography was performed in 2007, no treatment delivered at that time. Right carotid endarterectomy in 2009, being followed by duplex ultrasound. Doppler remains stable.   [name]  CAD (coronary artery disease)      PTCA of the right coronary artery in Feb. 1987, July 1987, and June 1990. Circumflex PTCA in July 1993, followed by a RCA, PTCA in Sept. 1994. CABG in 1994x5 with a LIMA to the LAD, vein graft to the first diagonal, vein graft to the marginal and left circumflex coronary artery, and sequential graft to the PDA and PLA branches of the RCA   .  HTN (hypertension)      Renal dopplers have been performed on a semi annual basis. Left renal artery demonstrated vessel narrowing with elevated velocities consistent with a 1-59% diameter reduction.   .  carotid doppler      Left ECA : The proximal most aspect of the left external carotid artery demonstrates a peak systolic velocity of 493.9 cm/s. This is consistent with a 70-99% significant stenosis correlated with a 2007 angiogram.    Past Surgical History   Procedure  Laterality  Date   .  Coronary artery bypass graft     .  Spinal fusion     .  Back surgery      Social History: reports that he stopped smoking in his late 12s.  Smokeless tobacco history unkown. He reports that he does not drink alcohol or use illisit drugs.  Patient lives at home with his wife and has no difficulty completing ADLs.  Allergies   Allergen  Reactions   .  Lisinopril  Cough    Family History   Problem  Relation  Age of Onset   .  Cancer  Mother      unknown to pt   .  Cancer  Father      pancreas   .  Cancer  Paternal Grandfather      prostate   Mother died of Peritonitis in her 41s  Prior to Admission medications   Medication  Sig  Start Date  End Date  Taking?  Authorizing Provider   acetaminophen (TYLENOL) 325 MG tablet  Take 650 mg by mouth every 6 (six) hours as needed for pain.    Yes  Historical Provider, MD   amLODipine (NORVASC) 10 MG tablet  Take  10 mg by mouth daily.    Yes  Historical Provider, MD   aspirin EC 81 MG tablet  Take 81 mg by mouth at bedtime.    Yes  Historical Provider, MD   carvedilol (COREG) 25 MG tablet  Take 25 mg by mouth 2 (two) times daily with a meal.    Yes  Historical Provider, MD   clopidogrel (PLAVIX) 75 MG tablet  Take 1 tablet (75 mg total) by mouth daily.  03/05/11   Yes  Wilburt Finlay, PA-C   diphenhydrAMINE (BENADRYL) 25 MG tablet  Take 25 mg by mouth daily.    Yes  Historical Provider, MD   FOLIC ACID PO  Take 600 mcg by mouth daily.    Yes  Historical Provider, MD   furosemide (LASIX) 40 MG tablet  Take 40 mg by mouth daily.    Yes  Historical Provider, MD   glipiZIDE (GLUCOTROL) 10 MG tablet  Take 20 mg by mouth 2 (two) times daily.    Yes  Historical Provider, MD   Glucosamine-Chondroitin (MOVE FREE PO)  Take 1 tablet by mouth 2 (two) times daily.    Yes  Historical Provider, MD   hydrALAZINE (APRESOLINE) 25 MG tablet  Take 25 mg by mouth 2 (two) times daily.    Yes  Historical Provider, MD   isosorbide mononitrate (IMDUR) 60 MG 24 hr tablet  Take 60 mg by mouth daily.    Yes  Historical Provider, MD   loratadine (CLARITIN) 10 MG tablet  Take 10 mg by mouth daily.    Yes  Historical Provider, MD   losartan (COZAAR) 100 MG tablet  Take 50 mg by mouth 2 (two) times daily.    Yes  Historical Provider, MD   Multiple Vitamins-Minerals (MULTIVITAMINS THER. W/MINERALS) TABS  Take 1 tablet by mouth daily.    Yes  Historical Provider, MD   omeprazole (PRILOSEC) 20 MG capsule  Take 20 mg by mouth 2 (two) times daily.    Yes  Historical Provider, MD   pravastatin (PRAVACHOL) 40 MG tablet  Take 40 mg by mouth at bedtime.    Yes  Historical Provider, MD   Physical Exam:  Filed Vitals:    12/22/12 1400   BP:  161/56   Pulse:  70   Temp:    Resp:     General: Well appearing, resting comfortably in bed  Cardiovascular: RRR with audible S1/S2 split, no m/g/r. Distal pulses intact.  Respiratory: CTAB no w/r/r   Abdomen:  Soft, mildly tender LLQ, no masses or organomegaly noted. Normoactive bowel sounds heard with L>R.  Skin: Mildly dry with normal skin turgor  Musculoskeletal: Significant 2+ peripheral pitting edema noted in bilateral lower extremities. Sensation intact in all extremities.  Psychiatric: AAO x 3  Neurologic: No areas of focal deficit noted Labs on Admission:  Basic Metabolic Panel:   Recent Labs  Lab  12/22/12 0934   NA  139   K  5.2*   CL  105   CO2  21   GLUCOSE  227*   BUN  49*   CREATININE  1.70*   CALCIUM  9.5    Liver Function Tests:   Recent Labs  Lab  12/22/12 0934   AST  16   ALT  13   ALKPHOS  60   BILITOT  0.5   PROT  7.4   ALBUMIN  4.3     Recent Labs  Lab  12/22/12 0934   LIPASE  25    CBC:   Recent Labs  Lab  12/22/12 0934   WBC  10.0   NEUTROABS  7.9*   HGB  11.0*   HCT  31.9*   MCV  94.4   PLT  194    CBG:   Recent Labs  Lab  12/22/12 1358   GLUCAP  161*    Radiological Exams on Admission:  Ct Abdomen Pelvis Wo Contrast  12/22/2012 CLINICAL DATA: Abdominal pain, vomiting, history of bypass surgery EXAM: CT ABDOMEN AND PELVIS WITHOUT CONTRAST TECHNIQUE: Multidetector CT imaging of the abdomen and pelvis was performed following the standard protocol without intravenous contrast. COMPARISON: None. FINDINGS: Lung bases are unremarkable. Sagittal images of the spine shows postsurgical changes at L2, L3, L4 and L5 level. There is mild dextroscoliosis of the lumbar spine. Degenerative changes are noted lumbar spine. Unenhanced liver shows no biliary ductal dilatation. At least 2 calcified gallstones are noted within gallbladder neck region the largest measures 8.4 mm. No pericholecystic fluid. The study is limited without IV contrast. Atherosclerotic calcifications of abdominal aorta and iliac arteries. No aortic aneurysm. No nephrolithiasis. No hydronephrosis or hydroureter. Unenhanced kidneys shows mild perinephric stranding. There is  bilateral renal cortical thinning and cortical scarring probable due to atrophy. Some contrast material noted in proximal stomach. There are distended small bowel loops in left upper abdomen with multiple air-fluid levels. In axial image 40 there is a distended small bowel loops with fecal like material. After this point there is small caliber of the small bowel as seen in coronal image 22. Findings are highly suspicious for small bowel obstruction. The distal small bowel is empty small caliber. No any contrast material noted in distal small bowel or within colon. There is some stool in the right colon. Moderate stool noted in sigmoid colon. Sigmoid colon diverticula are noted. No evidence of acute diverticulitis. Mild enlarged prostate gland with indentation of urinary bladder base. Mild distended urinary bladder. Pelvic phleboliths are noted. There is no pericecal inflammation. Normal appendix is clearly visualize in axial image 61. IMPRESSION: 1. There are distended small bowel loops in left upper abdomen with multiple air-fluid levels. There is transition point in caliber of the small bowel in axial image 41. Findings are highly suspicious for small bowel obstruction. 2. At least 2 calcified gallstones are noted within gallbladder the largest measures 8.2 mm. 3. Postsurgical changes lumbar spine. 4. No ascites or free air. 5. Moderate stool noted in sigmoid colon. Multiple sigmoid colon diverticula. No evidence  of acute diverticulitis. 6. There is an enlarged prostate gland with mild indentation of urinary bladder base. 7. No pericecal inflammation. Normal appendix is partially visualized. Electronically Signed By: Natasha Mead On: 12/22/2012 12:53   EKG: Independently reviewed. LBBB chronic  Assessment/Plan  Principal Problem:  SBO (small bowel obstruction)  Active Problems:  HYPERTENSION  Angina at rest  CAD (coronary artery disease)- CABG '94. PCI attempt 2012, Myoview low risk Jan 2013  LV  dysfunction- EF 43% by Myoview Jan 2013  CKD (chronic kidney disease) stage 3, GFR 30-59 ml/min  LBBB (left bundle branch block)  Hyperlipemia  Chronic renal insufficiency, stage III (moderate)- follwed by Dr Abel Presto  DM2 (diabetes mellitus, type 2)   SBO  Likely from adhesions from previous surgeries.  -CT abdomen: There are distended small bowel loops in left upper abdomen with  multiple air-fluid levels.  -N/G in place for decompression.  -NPO  -Surgery consulted; Barnetta Chapel PA-C  HTN  -Well controlled on home medication regimen  [facility] consulted  CAD  -Patient reported he currently has 3 blockages and a partially broken stint  -Followed by [facility] Cardiology; Dr. Bobette Mo  -Cardiology consulted-- see note  -81 mg asa and plavix held for potential surgery   CKD  -Followed by Dr. Arrie Aran  -Creatnine is slightly above baseline today at 2.0. Baseline is 1.7.  -Will monitor with daily BMPs at this time   CHF  -Appears slightly decompensated with lower ext edema  -Will restrict fluid intake and hold lasix for now.   DM  -Well controlled on glipizide at home however he did not take it this morning since he hasn't eaten today.  -Will start on Insulin inpatient - SSI-S  -CBG was 161 in ED   Consultations  Surgery Consulted  [facility] Consulted   Code Status: full code  Family Communication: Wife at bedside  Disposition Plan: Inpatient  Time spent: [address], PA-S2 [place] [facility] Pager [phone]  If 7PM-7AM, please contact night-coverage [email or web address] Password [facility] 12/22/2012, 3:17 PM

## 2012-12-22 NOTE — ED Provider Notes (Signed)
TIME SEEN: 8:52 AM  CHIEF COMPLAINT: Left-sided abdominal pain, vomiting  HPI: Patient is a 79 year old male with a history of coronary artery disease status post stents and CABG, CHF, hypertension, insulin-dependent diabetes, hyperlipidemia who presents the emergency department with abdominal pain that started last night with several episodes of nonbloody, nonbilious vomiting. Patient reports that his abdominal pain is around the umbilicus and on the left mid abdomen. He is been told he has had hernia in this area but has never noticed it himself. He states that he has had chills but no fever. No diarrhea. Last bowel movement was last night was normal. No bloody stool or melena. No dysuria or hematuria. No penile discharge, testicular pain or swelling, scrotal masses. Denies any chest pain or shortness of breath. No sick contacts, recent travel, recent hospitalization.  ROS: See HPI Constitutional: no fever  Eyes: no drainage  ENT: no runny nose   Cardiovascular:  no chest pain  Resp: no SOB  GI:  vomiting GU: no dysuria Integumentary: no rash  Allergy: no hives  Musculoskeletal: no leg swelling  Neurological: no slurred speech ROS otherwise negative  PAST MEDICAL HISTORY/PAST SURGICAL HISTORY:  Past Medical History  Diagnosis Date  . Angina at rest 02/27/2011  . CAD (coronary artery disease) 02/27/2011    Catheterized on 02/28/11, graft supplying marginal vessel was occluded, after 48 hours of hydration, a repeat cath was performed in an attempt at PCI 03/02/11 cath had an unsuccessful attempt at PCI to an occluded calcified circumflex vessel, Residual distal tip of the ChoICE PT moderate support wire at the subtotal occlusion. No change in the subtotal occlusion,improved retrograde collateralization  . LV dysfunction 02/27/2011  . CKD (chronic kidney disease) stage 3, GFR 30-59 ml/min 02/27/2011  . LBBB (left bundle branch block)   . LBBB (left bundle branch block) 02/27/2011  .  Hypertension   . Hyperlipemia 02/27/2011  . Diabetes mellitus 02/27/2011    insulin dependent  . GERD (gastroesophageal reflux disease)   . CHF (congestive heart failure)   . Blood transfusion     no reaction to transfusion per patient  . Arthritis   . Myocardial infarction   . PVD (peripheral vascular disease)     Carotid Angiography was performed in 2007, no treatment delivered at that time. Right carotid endarterectomy in 2009, being followed by duplex ultrasound. Doppler remains stable.  [name] CAD (coronary artery disease)     PTCA of the right coronary artery in Feb. 1987, July 1987, and June 1990. Circumflex PTCA in July 1993, followed by a RCA, PTCA in Sept. 1994. CABG in 1994x5 with a LIMA to the LAD, vein graft to the first diagonal, vein graft to the marginal and left circumflex coronary artery, and sequential graft to the PDA and PLA branches of the RCA  . HTN (hypertension)     Renal dopplers have been performed on a semi annual basis. Left renal artery demonstrated vessel narrowing with elevated velocities consistent with a 1-59% diameter reduction.   . carotid doppler     Left ECA : The proximal most aspect of the left external carotid artery demonstrates a peak systolic velocity of 493.9 cm/s. This is consistent with a 70-99% significant stenosis correlated with a 2007 angiogram.    MEDICATIONS:  Prior to Admission medications   Medication Sig Start Date End Date Taking? Authorizing Provider  acetaminophen (TYLENOL) 325 MG tablet Take 650 mg by mouth every 6 (six) hours as needed for pain.    Historical  Provider, MD  amLODipine (NORVASC) 10 MG tablet Take 10 mg by mouth daily.      Historical Provider, MD  aspirin EC 81 MG tablet Take 81 mg by mouth at bedtime.      Historical Provider, MD  carvedilol (COREG) 25 MG tablet Take 25 mg by mouth 2 (two) times daily with a meal.      Historical Provider, MD  clopidogrel (PLAVIX) 75 MG tablet Take 1 tablet (75 mg total) by mouth daily.  03/05/11   Wilburt Finlay, PA-C  furosemide (LASIX) 40 MG tablet Take 40 mg by mouth daily.      Historical Provider, MD  glipiZIDE (GLUCOTROL) 10 MG tablet Take 20 mg by mouth 2 (two) times daily.     Historical Provider, MD  Glucosamine-Chondroitin (MOVE FREE PO) Take 1 tablet by mouth 2 (two) times daily.     Historical Provider, MD  hydrALAZINE (APRESOLINE) 25 MG tablet Take 25 mg by mouth 2 (two) times daily.      Historical Provider, MD  loratadine (CLARITIN) 10 MG tablet Take 10 mg by mouth daily.    Historical Provider, MD  Multiple Vitamins-Minerals (MULTIVITAMINS THER. W/MINERALS) TABS Take 1 tablet by mouth daily.      Historical Provider, MD  omeprazole (PRILOSEC) 20 MG capsule Take 20 mg by mouth 2 (two) times daily.     Historical Provider, MD  pravastatin (PRAVACHOL) 40 MG tablet Take 40 mg by mouth at bedtime.      Historical Provider, MD    ALLERGIES:  Allergies  Allergen Reactions  . Lisinopril Cough    SOCIAL HISTORY:  History  Substance Use Topics  . Smoking status: Never Smoker   . Smokeless tobacco: Never Used     Comment: quit smoking in the 1960's  . Alcohol Use: No    FAMILY HISTORY: Family History  Problem Relation Age of Onset  . Cancer Mother     unknown to pt  . Cancer Father     pancreas  . Cancer Paternal Grandfather     prostate    EXAM: BP 172/63  Pulse 70  Temp(Src) 98 F (36.7 C) (Oral)  Resp 18  SpO2 94% CONSTITUTIONAL: Alert and oriented and responds appropriately to questions. Well-appearing; well-nourished HEAD: Normocephalic EYES: Conjunctivae clear, PERRL ENT: normal nose; no rhinorrhea; moist mucous membranes; pharynx without lesions noted NECK: Supple, no meningismus, no LAD  CARD: RRR; S1 and S2 appreciated; no murmurs, no clicks, no rubs, no gallops RESP: Normal chest excursion without splinting or tachypnea; breath sounds clear and equal bilaterally; no wheezes, no rhonchi, no rales,  ABD/GI: Normal bowel sounds;  non-distended; soft, not tympanitic, patient is tender to palpation over his left mid abdomen with voluntary guarding, no rebound or peritoneal signs, patient is a well-healed surgical scar slightly left of midline on his anterior abdomen from a prior back surgery, no hernia appreciated on my exam but it is limited secondary to patient's pain BACK:  The back appears normal and is non-tender to palpation, there is no CVA tenderness EXT: Normal ROM in all joints; non-tender to palpation; no edema; normal capillary refill; no cyanosis    SKIN: Normal color for age and race; warm NEURO: Moves all extremities equally PSYCH: The patient's mood and manner are appropriate. Grooming and personal hygiene are appropriate.  MEDICAL DECISION MAKING: Patient with abdominal pain, vomiting. He is hemodynamically stable. Patient is very tender to palpation over his left abdomen. He reports he has had diverticulitis before and  this feels somewhat similar. Last colonoscopy was a year ago and he had polyps removed by Dr. Randa Evens. Will obtain abdominal labs, urine, CT abdomen and pelvis. We'll give Zofran and morphine and reassess.   Date: 12/22/2012 8:45 AM  Rate: 65  Rhythm: Atrial fibrillation  QRS Axis: normal  Intervals: Left bundle branch block  ST/T Wave abnormalities: normal  Conduction Disutrbances: none  Narrative Interpretation: Atrial fibrillation, left bundle branch block, no new ischemic changes, unchanged compared to prior EKG on 12/04/2012      ED PROGRESS: Patient's labs are unremarkable other than a mild predominance of neutrophils but no overall leukocytosis. He reports his pain is improved. CT abdomen and pelvis, urine pending.   1:13 PM  Patient CT scan shows a small bowel obstruction. Patient also has gallstones without signs of cholecystitis. Discussed with Tresa Endo with general surgery who will see patient in the ED but recommends patient be admitted to medical service given his  comorbidities and they will consult. Will place NG tube. Patient's PCP is with [facility] physicians, Dr. Tiburcio Pea. Will discuss with hospitalist. Patient and family updated on plan.  1:36 PM  Hospitalist agrees with consult.  Surgery will admit given there is a transition point.  No true hernia seen on CT scan.  Layla Maw Ward, DO 12/22/12 1357

## 2012-12-22 NOTE — Consult Note (Signed)
  I have directly reviewed the clinical findings, lab, imaging studies and management of this patient in detail. I have interviewed and examined the patient and agree with the documentation,  as recorded by the Physician extender.  Leroy Sea M.D on 12/22/2012 at 3:48 PM  [facility] Office  [phone]

## 2012-12-22 NOTE — ED Notes (Addendum)
Pt reminded of need for urine sample. sts he still doesn't think he can go. Pt encouraged to start drinking the oral CT contrast.

## 2012-12-22 NOTE — ED Notes (Signed)
Pt finished drinking 1st cup of oral CT contrast, CT has been notified.

## 2012-12-22 NOTE — ED Notes (Signed)
Pt placed on 2liters/min oxygen [place].

## 2012-12-22 NOTE — ED Notes (Signed)
Called to CT to see when they would be coming to pt. Report he is the next one.

## 2012-12-22 NOTE — H&P (Deleted)
[facility] History and Physical  ANTHONEE GELIN JXB:[id number] DOB: 02-01-1933 DOA: 12/22/2012  Referring physician:  PCP: Dr. Holley Bouche Specialists: Dr. Bartholome[facility]s [facility]  Chief Complaint: Abdominal Pain  HPI: KEMO SPRUCE is a 79 y.o. male with a PMH significant for CAD, CKD, HTN, Hyperlipidemia, DM, CHF who presented to the ED with a complaints of severe abdominal pain located along his belt line.  Patient reported pain started yesterday afternoon and increased throughout the night.  Pt. Noted significant edema that has been developing in his lower extremities for the past 2 days.  He has doubled his Lasix dosing during that time without improvement.  One episode of vomiting this morning before coming to ED.   Review of Systems: Patient denies fever, chills, CP, SOB with exertion, no weight loss.  All other systems were reviewed and found to be negative.   Past Medical History  Diagnosis Date  . Angina at rest 02/27/2011  . CAD (coronary artery disease) 02/27/2011    Catheterized on 02/28/11, graft supplying marginal vessel was occluded, after 48 hours of hydration, a repeat cath was performed in an attempt at PCI 03/02/11 cath had an unsuccessful attempt at PCI to an occluded calcified circumflex vessel, Residual distal tip of the ChoICE PT moderate support wire at the subtotal occlusion. No change in the subtotal occlusion,improved retrograde collateralization  . LV dysfunction 02/27/2011  . CKD (chronic kidney disease) stage 3, GFR 30-59 ml/min 02/27/2011  . LBBB (left bundle branch block) 02/27/2011  . Hypertension   . Hyperlipemia 02/27/2011  . Diabetes mellitus 02/27/2011    insulin dependent  . GERD (gastroesophageal reflux disease)   . CHF (congestive heart failure)   . Blood transfusion     no reaction to transfusion per patient  . Arthritis   . Myocardial infarction   . PVD (peripheral vascular disease)     Carotid Angiography was performed in  2007, no treatment delivered at that time. Right carotid endarterectomy in 2009, being followed by duplex ultrasound. Doppler remains stable.  [name] CAD (coronary artery disease)     PTCA of the right coronary artery in Feb. 1987, July 1987, and June 1990. Circumflex PTCA in July 1993, followed by a RCA, PTCA in Sept. 1994. CABG in 1994x5 with a LIMA to the LAD, vein graft to the first diagonal, vein graft to the marginal and left circumflex coronary artery, and sequential graft to the PDA and PLA branches of the RCA  . HTN (hypertension)     Renal dopplers have been performed on a semi annual basis. Left renal artery demonstrated vessel narrowing with elevated velocities consistent with a 1-59% diameter reduction.   . carotid doppler     Left ECA : The proximal most aspect of the left external carotid artery demonstrates a peak systolic velocity of 493.9 cm/s. This is consistent with a 70-99% significant stenosis correlated with a 2007 angiogram.   Past Surgical History  Procedure Laterality Date  . Coronary artery bypass graft    . Spinal fusion    . Back surgery     Social History:  reports that he stopped smoking in his late 90s. Smokeless tobacco history unkown. He reports that he does not drink alcohol or use illisit drugs. Patient lives at home with his wife and has no difficulty completing ADLs.   Allergies  Allergen Reactions  . Lisinopril Cough    Family History  Problem Relation Age of Onset  . Cancer Mother  unknown to pt  . Cancer Father     pancreas  . Cancer Paternal Grandfather     prostate  Mother died of Peritonitis in her 29s    Prior to Admission medications   Medication Sig Start Date End Date Taking? Authorizing Provider  acetaminophen (TYLENOL) 325 MG tablet Take 650 mg by mouth every 6 (six) hours as needed for pain.   Yes Historical Provider, MD  amLODipine (NORVASC) 10 MG tablet Take 10 mg by mouth daily.     Yes Historical Provider, MD  aspirin EC 81  MG tablet Take 81 mg by mouth at bedtime.     Yes Historical Provider, MD  carvedilol (COREG) 25 MG tablet Take 25 mg by mouth 2 (two) times daily with a meal.     Yes Historical Provider, MD  clopidogrel (PLAVIX) 75 MG tablet Take 1 tablet (75 mg total) by mouth daily. 03/05/11  Yes Wilburt Finlay, PA-C  diphenhydrAMINE (BENADRYL) 25 MG tablet Take 25 mg by mouth daily.   Yes Historical Provider, MD  FOLIC ACID PO Take 600 mcg by mouth daily.   Yes Historical Provider, MD  furosemide (LASIX) 40 MG tablet Take 40 mg by mouth daily.     Yes Historical Provider, MD  glipiZIDE (GLUCOTROL) 10 MG tablet Take 20 mg by mouth 2 (two) times daily.    Yes Historical Provider, MD  Glucosamine-Chondroitin (MOVE FREE PO) Take 1 tablet by mouth 2 (two) times daily.    Yes Historical Provider, MD  hydrALAZINE (APRESOLINE) 25 MG tablet Take 25 mg by mouth 2 (two) times daily.     Yes Historical Provider, MD  isosorbide mononitrate (IMDUR) 60 MG 24 hr tablet Take 60 mg by mouth daily.   Yes Historical Provider, MD  loratadine (CLARITIN) 10 MG tablet Take 10 mg by mouth daily.   Yes Historical Provider, MD  losartan (COZAAR) 100 MG tablet Take 50 mg by mouth 2 (two) times daily.   Yes Historical Provider, MD  Multiple Vitamins-Minerals (MULTIVITAMINS THER. W/MINERALS) TABS Take 1 tablet by mouth daily.     Yes Historical Provider, MD  omeprazole (PRILOSEC) 20 MG capsule Take 20 mg by mouth 2 (two) times daily.    Yes Historical Provider, MD  pravastatin (PRAVACHOL) 40 MG tablet Take 40 mg by mouth at bedtime.     Yes Historical Provider, MD   Physical Exam: Filed Vitals:   12/22/12 1400  BP: 161/56  Pulse: 70  Temp:   Resp:      General:  Well appearing, resting comfortably in bed  Cardiovascular: RRR with audible S1/S2 split, no m/g/r.  Distal pulses intact.   Respiratory: CTAB no w/r/r  Abdomen: Soft, mildly tender LLQ, no masses or organomegaly noted.  Normoactive bowel sounds heard with L>R.     Skin: Mildly dry with normal skin turgor  Musculoskeletal: Significant 2+ peripheral pitting edema noted in bilateral lower extremities. Sensation intact in all extremities.  Psychiatric: AAO x 3  Neurologic: No areas of focal deficit noted  Labs on Admission:  Basic Metabolic Panel:  Recent Labs Lab 12/22/12 0934  NA 139  K 5.2*  CL 105  CO2 21  GLUCOSE 227*  BUN 49*  CREATININE 1.70*  CALCIUM 9.5   Liver Function Tests:  Recent Labs Lab 12/22/12 0934  AST 16  ALT 13  ALKPHOS 60  BILITOT 0.5  PROT 7.4  ALBUMIN 4.3    Recent Labs Lab 12/22/12 0934  LIPASE 25   CBC:  Recent Labs Lab 12/22/12 0934  WBC 10.0  NEUTROABS 7.9*  HGB 11.0*  HCT 31.9*  MCV 94.4  PLT 194   CBG:  Recent Labs Lab 12/22/12 1358  GLUCAP 161*    Radiological Exams on Admission: Ct Abdomen Pelvis Wo Contrast  12/22/2012   CLINICAL DATA:  Abdominal pain, vomiting, history of bypass surgery  EXAM: CT ABDOMEN AND PELVIS WITHOUT CONTRAST  TECHNIQUE: Multidetector CT imaging of the abdomen and pelvis was performed following the standard protocol without intravenous contrast.  COMPARISON:  None.  FINDINGS: Lung bases are unremarkable.  Sagittal images of the spine shows postsurgical changes at L2, L3, L4 and L5 level. There is mild dextroscoliosis of the lumbar spine. Degenerative changes are noted lumbar spine.  Unenhanced liver shows no biliary ductal dilatation. At least 2 calcified gallstones are noted within gallbladder neck region the largest measures 8.4 mm. No pericholecystic fluid.  The study is limited without IV contrast. Atherosclerotic calcifications of abdominal aorta and iliac arteries. No aortic aneurysm. No nephrolithiasis. No hydronephrosis or hydroureter. Unenhanced kidneys shows mild perinephric stranding. There is bilateral renal cortical thinning and cortical scarring probable due to atrophy.  Some contrast material noted in proximal stomach.  There are distended  small bowel loops in left upper abdomen with multiple air-fluid levels. In axial image 40 there is a distended small bowel loops with fecal like material. After this point there is small caliber of the small bowel as seen in coronal image 22. Findings are highly suspicious for small bowel obstruction. The distal small bowel is empty small caliber. No any contrast material noted in distal small bowel or within colon. There is some stool in the right colon. Moderate stool noted in sigmoid colon. Sigmoid colon diverticula are noted. No evidence of acute diverticulitis. Mild enlarged prostate gland with indentation of urinary bladder base. Mild distended urinary bladder. Pelvic phleboliths are noted. There is no pericecal inflammation. Normal appendix is clearly visualize in axial image 61.  IMPRESSION: 1. There are distended small bowel loops in left upper abdomen with multiple air-fluid levels. There is transition point in caliber of the small bowel in axial image 41. Findings are highly suspicious for small bowel obstruction. 2. At least 2 calcified gallstones are noted within gallbladder the largest measures 8.2 mm. 3. Postsurgical changes lumbar spine. 4. No ascites or free air. 5. Moderate stool noted in sigmoid colon. Multiple sigmoid colon diverticula. No evidence of acute diverticulitis. 6. There is an enlarged prostate gland with mild indentation of urinary bladder base. 7. No pericecal inflammation. Normal appendix is partially visualized.   Electronically Signed   By: Natasha Mead   On: 12/22/2012 12:53    EKG: Independently reviewed. LBBB chronic  Assessment/Plan Principal Problem:   SBO (small bowel obstruction) Active Problems:   HYPERTENSION   Angina at rest   CAD (coronary artery disease)- CABG '94. PCI attempt 2012, Myoview low risk Jan 2013   LV dysfunction- EF 43% by Myoview Jan 2013   CKD (chronic kidney disease) stage 3, GFR 30-59 ml/min   LBBB (left bundle branch block)    Hyperlipemia   Chronic renal insufficiency, stage III (moderate)- follwed by Dr Abel Presto   DM2 (diabetes mellitus, type 2)   SBO Likely from adhesions from previous surgeries. -CT abdomen: There are distended small bowel loops in left upper abdomen with  multiple air-fluid levels.  -N/G in place for decompression. -NPO -Surgery consulted; Barnetta Chapel PA-C-- see note  HTN -Well controlled on home medication  regimen [facility] Cardiology consulted-- Brittainy Simmon PA-C-- see note  CAD -Patient reported he currently has 3 blockages and a partially broken stint -Followed by [facility]; Dr. Bobette Mo -Cardiology consulted-- see note -81 mg asa and plavix held for potential surgery  CKD -Followed by Dr. Arrie Aran -Creatnine is slightly above baseline today at 2.0. Baseline is 1.7. -Will monitor with daily BMPs at this time  CHF Appears slightly decompensated with lower ext edema Will restrict fluid intake and hold lasix for now.  DM  -Well controlled on glipizide at home however he did not take it this morning since he hasn't eaten today. -Will start on Insulin inpatient - SSI-S -CBG was 161 in ED  Consultations Surgery Consulted [facility] Consulted  Code Status: full code Family Communication: Wife at bedside Disposition Plan: Admit to inpatient Rm 223  Time spent: 62  Stevphen Meuse, Physician Assistant Student [place], [place] [phone] [facility]

## 2012-12-22 NOTE — Progress Notes (Signed)
Admitted to room 6n31. A/ox4, ambulated to BR, oriented to room and surroundings, family at beside, c/o pain 6/10, denies nausea

## 2012-12-22 NOTE — ED Notes (Signed)
Pt reports last night around 8pm, he started having some abd cramping and nausea, sts he wasn't able to get much sleep. Rating pain at 6/10. sts he tried tylenol but didn't get much relief. Reports unable to eat anything. sts he hasn't tried drinking any fluids. sts on the way here he vomited X 2. Denies bld in vomit and stool. Denies urinary issues. Denies diarrhea. sts he does feel a little constipated. LBM 2am, sts it was regular for him just small amount. Pt in nad, skin warm and dry, resp e/u.

## 2012-12-22 NOTE — ED Notes (Signed)
Pt asked if he wanted more pain meds, sts he thinks he is okay for now. Told to let me know if the pain gets worse.

## 2012-12-22 NOTE — Consult Note (Signed)
Reason for Consult: Surgical Clearance Referring Physician:  General Surgery   HPI: The patient is a 79 y/o male followed by Dr Allyson Sabal with a history of CAD. He had CABG in '94. In December 2012 he had angina and underwent an attempted PCI to the CFX but the tip of the catheter broke off and the procedure was aborted. Fortunately he has done well since. He had a Myoview Jan 2013 that was low risk. His EF at that time was 42%. He saw [name], PA-C in the office on 12/04/12 for a 6 month f/u and that time was stable from a cardiac standpoint.   He presented to the [facility] ER today with a complaint of severe diffuse abdominal pain that first began around 9 PM last night. W/U in the ER revealed a SBO. [facility] has been consulted for surgical clearance, in the event that surgery is needed. He is now being managed conservatively with bowel rest and has a NGT. He reports improvement in symptoms. He denies chest pain and SOB. He has noticed increased bilateral LEE and weight gain over the past 7 days. He reports daily medical compliance and denies dietary indiscretion. He reports doubling his PO Lasix at home, but reports little improvement.   Past Medical History  Diagnosis Date  . Angina at rest 02/27/2011  . CAD (coronary artery disease) 02/27/2011    Catheterized on 02/28/11, graft supplying marginal vessel was occluded, after 48 hours of hydration, a repeat cath was performed in an attempt at PCI 03/02/11 cath had an unsuccessful attempt at PCI to an occluded calcified circumflex vessel, Residual distal tip of the ChoICE PT moderate support wire at the subtotal occlusion. No change in the subtotal occlusion,improved retrograde collateralization  . LV dysfunction 02/27/2011  . CKD (chronic kidney disease) stage 3, GFR 30-59 ml/min 02/27/2011  . LBBB (left bundle branch block)   . LBBB (left bundle branch block) 02/27/2011  . Hypertension   . Hyperlipemia 02/27/2011  . Diabetes mellitus 02/27/2011    insulin  dependent  . GERD (gastroesophageal reflux disease)   . CHF (congestive heart failure)   . Blood transfusion     no reaction to transfusion per patient  . Arthritis   . Myocardial infarction   . PVD (peripheral vascular disease)     Carotid Angiography was performed in 2007, no treatment delivered at that time. Right carotid endarterectomy in 2009, being followed by duplex ultrasound. Doppler remains stable.  [name] CAD (coronary artery disease)     PTCA of the right coronary artery in Feb. 1987, July 1987, and June 1990. Circumflex PTCA in July 1993, followed by a RCA, PTCA in Sept. 1994. CABG in 1994x5 with a LIMA to the LAD, vein graft to the first diagonal, vein graft to the marginal and left circumflex coronary artery, and sequential graft to the PDA and PLA branches of the RCA  . HTN (hypertension)     Renal dopplers have been performed on a semi annual basis. Left renal artery demonstrated vessel narrowing with elevated velocities consistent with a 1-59% diameter reduction.   . carotid doppler     Left ECA : The proximal most aspect of the left external carotid artery demonstrates a peak systolic velocity of 493.9 cm/s. This is consistent with a 70-99% significant stenosis correlated with a 2007 angiogram.    Past Surgical History  Procedure Laterality Date  . Coronary artery bypass graft    . Spinal fusion    . Back surgery  Family History  Problem Relation Age of Onset  . Cancer Mother     unknown to pt  . Cancer Father     pancreas  . Cancer Paternal Grandfather     prostate    Social History:  reports that he has never smoked. He has never used smokeless tobacco. He reports that he does not drink alcohol or use illicit drugs.  Allergies:  Allergies  Allergen Reactions  . Lisinopril Cough    Medications:  Prior to Admission medications   Medication Sig Start Date End Date Taking? Authorizing Provider  acetaminophen (TYLENOL) 325 MG tablet Take 650 mg by mouth  every 6 (six) hours as needed for pain.   Yes Historical Provider, MD  amLODipine (NORVASC) 10 MG tablet Take 10 mg by mouth daily.     Yes Historical Provider, MD  aspirin EC 81 MG tablet Take 81 mg by mouth at bedtime.     Yes Historical Provider, MD  carvedilol (COREG) 25 MG tablet Take 25 mg by mouth 2 (two) times daily with a meal.     Yes Historical Provider, MD  clopidogrel (PLAVIX) 75 MG tablet Take 1 tablet (75 mg total) by mouth daily. 03/05/11  Yes Wilburt Finlay, PA-C  diphenhydrAMINE (BENADRYL) 25 MG tablet Take 25 mg by mouth daily.   Yes Historical Provider, MD  FOLIC ACID PO Take 600 mcg by mouth daily.   Yes Historical Provider, MD  furosemide (LASIX) 40 MG tablet Take 40 mg by mouth daily.     Yes Historical Provider, MD  glipiZIDE (GLUCOTROL) 10 MG tablet Take 20 mg by mouth 2 (two) times daily.    Yes Historical Provider, MD  Glucosamine-Chondroitin (MOVE FREE PO) Take 1 tablet by mouth 2 (two) times daily.    Yes Historical Provider, MD  hydrALAZINE (APRESOLINE) 25 MG tablet Take 25 mg by mouth 2 (two) times daily.     Yes Historical Provider, MD  isosorbide mononitrate (IMDUR) 60 MG 24 hr tablet Take 60 mg by mouth daily.   Yes Historical Provider, MD  loratadine (CLARITIN) 10 MG tablet Take 10 mg by mouth daily.   Yes Historical Provider, MD  losartan (COZAAR) 100 MG tablet Take 50 mg by mouth 2 (two) times daily.   Yes Historical Provider, MD  Multiple Vitamins-Minerals (MULTIVITAMINS THER. W/MINERALS) TABS Take 1 tablet by mouth daily.     Yes Historical Provider, MD  omeprazole (PRILOSEC) 20 MG capsule Take 20 mg by mouth 2 (two) times daily.    Yes Historical Provider, MD  pravastatin (PRAVACHOL) 40 MG tablet Take 40 mg by mouth at bedtime.     Yes Historical Provider, MD     Results for orders placed during the hospital encounter of 12/22/12 (from the past 48 hour(s))  CBC WITH DIFFERENTIAL     Status: Abnormal   Collection Time    12/22/12  9:34 AM      Result Value  Range   WBC 10.0  4.0 - 10.5 K/uL   RBC 3.38 (*) 4.22 - 5.81 MIL/uL   Hemoglobin 11.0 (*) 13.0 - 17.0 g/dL   HCT 19.1 (*) 47.8 - 29.5 %   MCV 94.4  78.0 - 100.0 fL   MCH 32.5  26.0 - 34.0 pg   MCHC 34.5  30.0 - 36.0 g/dL   RDW 62.1  30.8 - 65.7 %   Platelets 194  150 - 400 K/uL   Neutrophils Relative % 79 (*) 43 - 77 %  Neutro Abs 7.9 (*) 1.7 - 7.7 K/uL   Lymphocytes Relative 13  12 - 46 %   Lymphs Abs 1.3  0.7 - 4.0 K/uL   Monocytes Relative 6  3 - 12 %   Monocytes Absolute 0.6  0.1 - 1.0 K/uL   Eosinophils Relative 1  0 - 5 %   Eosinophils Absolute 0.1  0.0 - 0.7 K/uL   Basophils Relative 0  0 - 1 %   Basophils Absolute 0.0  0.0 - 0.1 K/uL  COMPREHENSIVE METABOLIC PANEL     Status: Abnormal   Collection Time    12/22/12  9:34 AM      Result Value Range   Sodium 139  135 - 145 mEq/L   Potassium 5.2 (*) 3.5 - 5.1 mEq/L   Chloride 105  96 - 112 mEq/L   CO2 21  19 - 32 mEq/L   Glucose, Bld 227 (*) 70 - 99 mg/dL   BUN 49 (*) 6 - 23 mg/dL   Creatinine, Ser 7.82 (*) 0.50 - 1.35 mg/dL   Calcium 9.5  8.4 - 95.6 mg/dL   Total Protein 7.4  6.0 - 8.3 g/dL   Albumin 4.3  3.5 - 5.2 g/dL   AST 16  0 - 37 U/L   ALT 13  0 - 53 U/L   Alkaline Phosphatase 60  39 - 117 U/L   Total Bilirubin 0.5  0.3 - 1.2 mg/dL   GFR calc non Af Amer 37 (*) >90 mL/min   GFR calc Af Amer 43 (*) >90 mL/min   Comment: (NOTE)     The eGFR has been calculated using the CKD EPI equation.     This calculation has not been validated in all clinical situations.     eGFR's persistently <90 mL/min signify possible Chronic Kidney     Disease.  LIPASE, BLOOD     Status: None   Collection Time    12/22/12  9:34 AM      Result Value Range   Lipase 25  11 - 59 U/L  POCT I-STAT TROPONIN I     Status: None   Collection Time    12/22/12  9:47 AM      Result Value Range   Troponin i, poc 0.00  0.00 - 0.08 ng/mL   Comment 3            Comment: Due to the release kinetics of cTnI,     a negative result within the  first hours     of the onset of symptoms does not rule out     myocardial infarction with certainty.     If myocardial infarction is still suspected,     repeat the test at appropriate intervals.  URINALYSIS, ROUTINE W REFLEX MICROSCOPIC     Status: Abnormal   Collection Time    12/22/12 12:34 PM      Result Value Range   Color, Urine YELLOW  YELLOW   APPearance CLEAR  CLEAR   Specific Gravity, Urine 1.020  1.005 - 1.030   pH 5.5  5.0 - 8.0   Glucose, UA 100 (*) NEGATIVE mg/dL   Hgb urine dipstick NEGATIVE  NEGATIVE   Bilirubin Urine NEGATIVE  NEGATIVE   Ketones, ur NEGATIVE  NEGATIVE mg/dL   Protein, ur 213 (*) NEGATIVE mg/dL   Urobilinogen, UA 0.2  0.0 - 1.0 mg/dL   Nitrite NEGATIVE  NEGATIVE   Leukocytes, UA NEGATIVE  NEGATIVE  URINE MICROSCOPIC-ADD ON  Status: None   Collection Time    12/22/12 12:34 PM      Result Value Range   Squamous Epithelial / LPF RARE  RARE   WBC, UA 0-2  <3 WBC/hpf   RBC / HPF 0-2  <3 RBC/hpf    Ct Abdomen Pelvis Wo Contrast  12/22/2012   CLINICAL DATA:  Abdominal pain, vomiting, history of bypass surgery  EXAM: CT ABDOMEN AND PELVIS WITHOUT CONTRAST  TECHNIQUE: Multidetector CT imaging of the abdomen and pelvis was performed following the standard protocol without intravenous contrast.  COMPARISON:  None.  FINDINGS: Lung bases are unremarkable.  Sagittal images of the spine shows postsurgical changes at L2, L3, L4 and L5 level. There is mild dextroscoliosis of the lumbar spine. Degenerative changes are noted lumbar spine.  Unenhanced liver shows no biliary ductal dilatation. At least 2 calcified gallstones are noted within gallbladder neck region the largest measures 8.4 mm. No pericholecystic fluid.  The study is limited without IV contrast. Atherosclerotic calcifications of abdominal aorta and iliac arteries. No aortic aneurysm. No nephrolithiasis. No hydronephrosis or hydroureter. Unenhanced kidneys shows mild perinephric stranding. There is  bilateral renal cortical thinning and cortical scarring probable due to atrophy.  Some contrast material noted in proximal stomach.  There are distended small bowel loops in left upper abdomen with multiple air-fluid levels. In axial image 40 there is a distended small bowel loops with fecal like material. After this point there is small caliber of the small bowel as seen in coronal image 22. Findings are highly suspicious for small bowel obstruction. The distal small bowel is empty small caliber. No any contrast material noted in distal small bowel or within colon. There is some stool in the right colon. Moderate stool noted in sigmoid colon. Sigmoid colon diverticula are noted. No evidence of acute diverticulitis. Mild enlarged prostate gland with indentation of urinary bladder base. Mild distended urinary bladder. Pelvic phleboliths are noted. There is no pericecal inflammation. Normal appendix is clearly visualize in axial image 61.  IMPRESSION: 1. There are distended small bowel loops in left upper abdomen with multiple air-fluid levels. There is transition point in caliber of the small bowel in axial image 41. Findings are highly suspicious for small bowel obstruction. 2. At least 2 calcified gallstones are noted within gallbladder the largest measures 8.2 mm. 3. Postsurgical changes lumbar spine. 4. No ascites or free air. 5. Moderate stool noted in sigmoid colon. Multiple sigmoid colon diverticula. No evidence of acute diverticulitis. 6. There is an enlarged prostate gland with mild indentation of urinary bladder base. 7. No pericecal inflammation. Normal appendix is partially visualized.   Electronically Signed   By: Natasha Mead   On: 12/22/2012 12:53    Review of Systems  Respiratory: Negative for shortness of breath.   Cardiovascular: Positive for leg swelling. Negative for chest pain, orthopnea and PND.  Gastrointestinal: Positive for nausea, vomiting and abdominal pain.  All other systems reviewed  and are negative.   Blood pressure 171/61, pulse 65, temperature 98.4 F (36.9 C), temperature source Oral, resp. rate 19, SpO2 91.00%. Physical Exam  Constitutional: He appears well-developed and well-nourished. No distress.  Neck: JVD (mild) present. Carotid bruit is present (bilateral).  Cardiovascular: Normal rate, regular rhythm, normal heart sounds and intact distal pulses.  Exam reveals no gallop and no friction rub.   No murmur heard. Pulses:      Radial pulses are 2+ on the right side, and 2+ on the left side.  Dorsalis pedis pulses are 2+ on the right side, and 2+ on the left side.  Respiratory: Effort normal. No respiratory distress. He has no wheezes. He has rales (mild bibasilar rales).  GI: Soft. Bowel sounds are normal. He exhibits no distension and no mass. There is tenderness (mild diffuse tenderness).  Musculoskeletal: He exhibits edema (2+ bilatearl LEE).  Skin: Skin is warm and dry. He is not diaphoretic.  Psychiatric: He has a normal mood and affect. His behavior is normal.    Assessment/Plan: Principal Problem:   SBO (small bowel obstruction) Active Problems:   HYPERTENSION   Angina at rest   CAD (coronary artery disease)- CABG '94. PCI attempt 2012, Myoview low risk Jan 2013   LV dysfunction- EF 43% by Myoview Jan 2013   CKD (chronic kidney disease) stage 3, GFR 30-59 ml/min   LBBB (left bundle branch block)   Hyperlipemia   Chronic renal insufficiency, stage III (moderate)- follwed by Dr Abel Presto   DM2 (diabetes mellitus, type 2)  Plan: Emergent plan for OR was placed on hold. SBO is being managed with bowel rest and NGT. If surgery is to be reconsidered, he can likely undergo surgery from a cardiac standpoint, as he had a normal NST last year and no anginal symptoms. His EKG is w/o acute changes. We will order however a 2D echo to ensure no new changes in systolic of valvular function/structure, since his last study. He does appear volume overloaded,  but suspect that this will likely improve with NGT. We will continue to assess and will initiate IV Lasix if need. We may need to initiate IV BB since he is unable to take PO. MD to follow.   Lin Landsman, PA-C 12/22/2012, 1:52 PM     I have seen and examined the patient along with Lennart Pall SIMMONS, PA-C.  I have reviewed the chart, notes and new data.  I agree with PA's note.  Key new complaints: much better after NGT placed. Noted edema and reduced UO for a few days before SBO symptom onset. No angina, no dyspnea Key examination changes: 2+ symmetrical ankle edema, no S3, no rales, noJVD Key new findings / data: recent low risk nuclear stress test, EF42% (January 2013)  PLAN: Repeat echo. If urgent surgery is necessary, risk is low to moderate. NG suction will probably contribute to resolution of mild hypervolemia - hold diuretics. Important to continue uninterrupted beta blockers to avoid rebound events. Change to IV metoprolol 5 mg Q6h. Can use IV NTG for additional BP control if necessary. Clopidogrel should be held in case he needs surgery.  Thurmon Fair, MD, [facility] [phone] 12/22/2012, 4:29 PM

## 2012-12-22 NOTE — ED Notes (Addendum)
Pt with complaints of upper abdominal pain, vomited twice this am, and belching.  Pt has history of bypass surgery.  Pt reports some possible constipation.  Pt reports swelling all over.  No pulsating mass observed

## 2012-12-22 NOTE — ED Notes (Signed)
Ice Chips and warm blanket given to patient.

## 2012-12-22 NOTE — ED Notes (Signed)
CBG 161 

## 2012-12-22 NOTE — H&P (Signed)
Left abdominal wall laxity due to denervation from left paramedian incision in the 1980's for his spine surgery.  Now with an apparent transition zone in the left side.    Will attempt NG suction/ decompression.  If no improvement, will likely need exploratory laparotomy with lysis of adhesions.  Discussed with patient.  Admit, TRH to assist with medical issues.  Cardiac clearance.  Hold Plavix and ASA.  Wilmon Arms. Corliss Skains, MD, [facility]  General/ Trauma Surgery  12/22/2012 3:13 PM

## 2012-12-22 NOTE — ED Notes (Signed)
Pt made aware of need for urine sample. Urinal and urine specimen cup at bedside.

## 2012-12-22 NOTE — ED Notes (Signed)
General Surgery MD at bedside.

## 2012-12-23 ENCOUNTER — Inpatient Hospital Stay (HOSPITAL_COMMUNITY): Payer: Medicare Other

## 2012-12-23 DIAGNOSIS — I369 Nonrheumatic tricuspid valve disorder, unspecified: Secondary | ICD-10-CM

## 2012-12-23 LAB — GLUCOSE, CAPILLARY
Glucose-Capillary: 100 mg/dL — ABNORMAL HIGH (ref 70–99)
Glucose-Capillary: 114 mg/dL — ABNORMAL HIGH (ref 70–99)
Glucose-Capillary: 116 mg/dL — ABNORMAL HIGH (ref 70–99)
Glucose-Capillary: 134 mg/dL — ABNORMAL HIGH (ref 70–99)
Glucose-Capillary: 134 mg/dL — ABNORMAL HIGH (ref 70–99)
Glucose-Capillary: 141 mg/dL — ABNORMAL HIGH (ref 70–99)

## 2012-12-23 LAB — BASIC METABOLIC PANEL
BUN: 38 mg/dL — ABNORMAL HIGH (ref 6–23)
Calcium: 9.1 mg/dL (ref 8.4–10.5)
Chloride: 110 mEq/L (ref 96–112)
GFR calc non Af Amer: 40 mL/min — ABNORMAL LOW (ref >90)
Glucose, Bld: 151 mg/dL — ABNORMAL HIGH (ref 70–99)
Potassium: 4.8 mEq/L (ref 3.5–5.1)
Sodium: 143 mEq/L (ref 135–145)

## 2012-12-23 LAB — CBC
HCT: 32.5 % — ABNORMAL LOW (ref 39.0–52.0)
Hemoglobin: 10.7 g/dL — ABNORMAL LOW (ref 13.0–17.0)
MCH: 31.9 pg (ref 26.0–34.0)
MCHC: 32.9 g/dL (ref 30.0–36.0)
RBC: 3.35 MIL/uL — ABNORMAL LOW (ref 4.22–5.81)

## 2012-12-23 MED ORDER — INFLUENZA VAC SPLIT QUAD 0.5 ML IM SUSP
0.5000 mL | INTRAMUSCULAR | Status: AC
  Administered 2012-12-24: 0.5 mL via INTRAMUSCULAR
  Filled 2012-12-23: qty 0.5

## 2012-12-23 MED ORDER — METOPROLOL TARTRATE 1 MG/ML IV SOLN
10.0000 mg | Freq: Four times a day (QID) | INTRAVENOUS | Status: DC
  Administered 2012-12-23 – 2012-12-25 (×6): 10 mg via INTRAVENOUS
  Filled 2012-12-23 (×9): qty 10

## 2012-12-23 MED ORDER — INSULIN ASPART 100 UNIT/ML ~~LOC~~ SOLN
0.0000 [IU] | SUBCUTANEOUS | Status: DC
  Administered 2012-12-23 – 2012-12-24 (×5): 1 [IU] via SUBCUTANEOUS
  Administered 2012-12-24: 2 [IU] via SUBCUTANEOUS
  Administered 2012-12-24: 1 [IU] via SUBCUTANEOUS
  Administered 2012-12-25: 2 [IU] via SUBCUTANEOUS

## 2012-12-23 MED ORDER — ENOXAPARIN SODIUM 40 MG/0.4ML ~~LOC~~ SOLN
40.0000 mg | Freq: Once | SUBCUTANEOUS | Status: AC
  Administered 2012-12-23: 40 mg via SUBCUTANEOUS

## 2012-12-23 NOTE — Consult Note (Signed)
[facility] CONSULT NOTE  [name] ZOX:[id number] DOB: 02-Nov-1932 DOA: 12/22/2012   Referring physician:  PCP: Dr. Holley Bouche  Specialists: Dr. [facility]  Chief Complaint: Abdominal Pain    HPI: [name] is a 79 y.o. male with a PMH significant for CAD, CKD, HTN, Hyperlipidemia, DM, CHF who presented to the ED with a complaints of severe abdominal pain located along his belt line. Patient reported pain started yesterday afternoon and increased throughout the night. Pt. Noted significant edema that has been developing in his lower extremities for the past 2 days. He has doubled his Lasix dosing during that time without improvement. One episode of vomiting this morning before coming to ED.   Review of Systems: Patient denies fever, chills, CP, SOB with exertion, no weight loss. All other systems were reviewed and found to be negative.   Past Medical History   Diagnosis  Date   .  Angina at rest  02/27/2011   .  CAD (coronary artery disease)  02/27/2011     Catheterized on 02/28/11, graft supplying marginal vessel was occluded, after 48 hours of hydration, a repeat cath was performed in an attempt at PCI 03/02/11 cath had an unsuccessful attempt at PCI to an occluded calcified circumflex vessel, Residual distal tip of the ChoICE PT moderate support wire at the subtotal occlusion. No change in the subtotal occlusion,improved retrograde collateralization   .  LV dysfunction  02/27/2011   .  CKD (chronic kidney disease) stage 3, GFR 30-59 ml/min  02/27/2011   .  LBBB (left bundle branch block)  02/27/2011   .  Hypertension    .  Hyperlipemia  02/27/2011   .  Diabetes mellitus  02/27/2011     insulin dependent   .  GERD (gastroesophageal reflux disease)    .  CHF (congestive heart failure)    .  Blood transfusion      no reaction to transfusion per patient   .  Arthritis    .  Myocardial infarction    .  PVD (peripheral vascular disease)      Carotid  Angiography was performed in 2007, no treatment delivered at that time. Right carotid endarterectomy in 2009, being followed by duplex ultrasound. Doppler remains stable.   [name]  CAD (coronary artery disease)      PTCA of the right coronary artery in Feb. 1987, July 1987, and June 1990. Circumflex PTCA in July 1993, followed by a RCA, PTCA in Sept. 1994. CABG in 1994x5 with a LIMA to the LAD, vein graft to the first diagonal, vein graft to the marginal and left circumflex coronary artery, and sequential graft to the PDA and PLA branches of the RCA   .  HTN (hypertension)      Renal dopplers have been performed on a semi annual basis. Left renal artery demonstrated vessel narrowing with elevated velocities consistent with a 1-59% diameter reduction.   .  carotid doppler      Left ECA : The proximal most aspect of the left external carotid artery demonstrates a peak systolic velocity of 493.9 cm/s. This is consistent with a 70-99% significant stenosis correlated with a 2007 angiogram.    Past Surgical History   Procedure  Laterality  Date   .  Coronary artery bypass graft     .  Spinal fusion     .  Back surgery      Social History: reports that he stopped smoking in his late 66s.  Smokeless tobacco history unkown. He reports that he does not drink alcohol or use illisit drugs.  Patient lives at home with his wife and has no difficulty completing ADLs.  Allergies   Allergen  Reactions   .  Lisinopril  Cough    Family History   Problem  Relation  Age of Onset   .  Cancer  Mother      unknown to pt   .  Cancer  Father      pancreas   .  Cancer  Paternal Grandfather      prostate   Mother died of Peritonitis in her 36s  Prior to Admission medications   Medication  Sig  Start Date  End Date  Taking?  Authorizing Provider   acetaminophen (TYLENOL) 325 MG tablet  Take 650 mg by mouth every 6 (six) hours as needed for pain.    Yes  Historical Provider, MD   amLODipine (NORVASC) 10 MG tablet  Take  10 mg by mouth daily.    Yes  Historical Provider, MD   aspirin EC 81 MG tablet  Take 81 mg by mouth at bedtime.    Yes  Historical Provider, MD   carvedilol (COREG) 25 MG tablet  Take 25 mg by mouth 2 (two) times daily with a meal.    Yes  Historical Provider, MD   clopidogrel (PLAVIX) 75 MG tablet  Take 1 tablet (75 mg total) by mouth daily.  03/05/11   Yes  Wilburt Finlay, PA-C   diphenhydrAMINE (BENADRYL) 25 MG tablet  Take 25 mg by mouth daily.    Yes  Historical Provider, MD   FOLIC ACID PO  Take 600 mcg by mouth daily.    Yes  Historical Provider, MD   furosemide (LASIX) 40 MG tablet  Take 40 mg by mouth daily.    Yes  Historical Provider, MD   glipiZIDE (GLUCOTROL) 10 MG tablet  Take 20 mg by mouth 2 (two) times daily.    Yes  Historical Provider, MD   Glucosamine-Chondroitin (MOVE FREE PO)  Take 1 tablet by mouth 2 (two) times daily.    Yes  Historical Provider, MD   hydrALAZINE (APRESOLINE) 25 MG tablet  Take 25 mg by mouth 2 (two) times daily.    Yes  Historical Provider, MD   isosorbide mononitrate (IMDUR) 60 MG 24 hr tablet  Take 60 mg by mouth daily.    Yes  Historical Provider, MD   loratadine (CLARITIN) 10 MG tablet  Take 10 mg by mouth daily.    Yes  Historical Provider, MD   losartan (COZAAR) 100 MG tablet  Take 50 mg by mouth 2 (two) times daily.    Yes  Historical Provider, MD   Multiple Vitamins-Minerals (MULTIVITAMINS THER. W/MINERALS) TABS  Take 1 tablet by mouth daily.    Yes  Historical Provider, MD   omeprazole (PRILOSEC) 20 MG capsule  Take 20 mg by mouth 2 (two) times daily.    Yes  Historical Provider, MD   pravastatin (PRAVACHOL) 40 MG tablet  Take 40 mg by mouth at bedtime.    Yes  Historical Provider, MD   Physical Exam:  Filed Vitals:    12/22/12 1400   BP:  161/56   Pulse:  70   Temp:    Resp:     General: Well appearing, resting comfortably in bed  Cardiovascular: RRR with audible S1/S2 split, no m/g/r. Distal pulses intact.  Respiratory: CTAB no w/r/r   Abdomen:  Soft, mildly tender LLQ, no masses or organomegaly noted. Normoactive bowel sounds heard with L>R.  Skin: Mildly dry with normal skin turgor  Musculoskeletal: Significant 2+ peripheral pitting edema noted in bilateral lower extremities. Sensation intact in all extremities.  Psychiatric: AAO x 3  Neurologic: No areas of focal deficit noted Labs on Admission:  Basic Metabolic Panel:   Recent Labs  Lab  12/22/12 0934   NA  139   K  5.2*   CL  105   CO2  21   GLUCOSE  227*   BUN  49*   CREATININE  1.70*   CALCIUM  9.5    Liver Function Tests:   Recent Labs  Lab  12/22/12 0934   AST  16   ALT  13   ALKPHOS  60   BILITOT  0.5   PROT  7.4   ALBUMIN  4.3     Recent Labs  Lab  12/22/12 0934   LIPASE  25    CBC:   Recent Labs  Lab  12/22/12 0934   WBC  10.0   NEUTROABS  7.9*   HGB  11.0*   HCT  31.9*   MCV  94.4   PLT  194    CBG:   Recent Labs  Lab  12/22/12 1358   GLUCAP  161*    Radiological Exams on Admission:  Ct Abdomen Pelvis Wo Contrast  12/22/2012 CLINICAL DATA: Abdominal pain, vomiting, history of bypass surgery EXAM: CT ABDOMEN AND PELVIS WITHOUT CONTRAST TECHNIQUE: Multidetector CT imaging of the abdomen and pelvis was performed following the standard protocol without intravenous contrast. COMPARISON: None. FINDINGS: Lung bases are unremarkable. Sagittal images of the spine shows postsurgical changes at L2, L3, L4 and L5 level. There is mild dextroscoliosis of the lumbar spine. Degenerative changes are noted lumbar spine. Unenhanced liver shows no biliary ductal dilatation. At least 2 calcified gallstones are noted within gallbladder neck region the largest measures 8.4 mm. No pericholecystic fluid. The study is limited without IV contrast. Atherosclerotic calcifications of abdominal aorta and iliac arteries. No aortic aneurysm. No nephrolithiasis. No hydronephrosis or hydroureter. Unenhanced kidneys shows mild perinephric stranding. There is  bilateral renal cortical thinning and cortical scarring probable due to atrophy. Some contrast material noted in proximal stomach. There are distended small bowel loops in left upper abdomen with multiple air-fluid levels. In axial image 40 there is a distended small bowel loops with fecal like material. After this point there is small caliber of the small bowel as seen in coronal image 22. Findings are highly suspicious for small bowel obstruction. The distal small bowel is empty small caliber. No any contrast material noted in distal small bowel or within colon. There is some stool in the right colon. Moderate stool noted in sigmoid colon. Sigmoid colon diverticula are noted. No evidence of acute diverticulitis. Mild enlarged prostate gland with indentation of urinary bladder base. Mild distended urinary bladder. Pelvic phleboliths are noted. There is no pericecal inflammation. Normal appendix is clearly visualize in axial image 61. IMPRESSION: 1. There are distended small bowel loops in left upper abdomen with multiple air-fluid levels. There is transition point in caliber of the small bowel in axial image 41. Findings are highly suspicious for small bowel obstruction. 2. At least 2 calcified gallstones are noted within gallbladder the largest measures 8.2 mm. 3. Postsurgical changes lumbar spine. 4. No ascites or free air. 5. Moderate stool noted in sigmoid colon. Multiple sigmoid colon diverticula. No evidence  of acute diverticulitis. 6. There is an enlarged prostate gland with mild indentation of urinary bladder base. 7. No pericecal inflammation. Normal appendix is partially visualized. Electronically Signed By: Natasha Mead On: 12/22/2012 12:53   EKG: Independently reviewed. LBBB chronic  Assessment/Plan  Principal Problem:  SBO (small bowel obstruction)  Active Problems:  HYPERTENSION  Angina at rest  CAD (coronary artery disease)- CABG '94. PCI attempt 2012, Myoview low risk Jan 2013  LV  dysfunction- EF 43% by Myoview Jan 2013  CKD (chronic kidney disease) stage 3, GFR 30-59 ml/min  LBBB (left bundle branch block)  Hyperlipemia  Chronic renal insufficiency, stage III (moderate)- follwed by Dr Abel Presto  DM2 (diabetes mellitus, type 2)   SBO  Likely from adhesions from previous surgeries.  -CT abdomen: There are distended small bowel loops in left upper abdomen with  multiple air-fluid levels.  -N/G in place for decompression.  -NPO  -Surgery consulted; Barnetta Chapel PA-C  HTN  -Currently not within [facility] guidelines increase metoprolol to 10 mg IV every 6 hr -Continue when necessary hydralazine  [facility] consulted  CAD  -Patient reported he currently has 3 blockages and a partially broken stint  -Followed by [facility]; Dr. Bobette Mo  -Cardiology consulted-- see note  -81 mg asa and plavix held for potential surgery   CKD  -Followed by Dr. Arrie Aran  -Creatnine is slightly above baseline today at 1.6 slightly above baseline  -Will monitor with daily BMPs at this time   CHF  -Appears slightly decompensated with lower ext edema  -Continue fluid intake and holding lasix.   DM  -Continue Insulin inpatient - SSI-S ; improved control -CBG was 161 in ED   Consultations  Surgery Consulted  [facility] Consulted   Code Status: full code  Family Communication: Wife at bedside  Disposition Plan: Inpatient  Time spent:  30 minutes  Joselyne Spake, Tessie Eke [facility] Pager [phone]  If 7PM-7AM, please contact night-coverage [email or web address] Password [email or web address] 12/23/2012, 8:09 AM

## 2012-12-23 NOTE — Progress Notes (Signed)
  Echocardiogram 2D Echocardiogram has been performed.  Aubryanna Nesheim FRANCES 12/23/2012, 3:51 PM

## 2012-12-23 NOTE — Progress Notes (Signed)
Subjective: Patient feeling a little better than yesterday but still with no gas/BM since 2 nights ago. Denies nausea/vomiting. Pt back from x-ray and nurse advancing NG this morning.  Objective: Vital signs in last 24 hours: Temp:  [98.1 F (36.7 C)-99.3 F (37.4 C)] 99.3 F (37.4 C) (09/30 0615) Pulse Rate:  [63-74] 74 (09/30 0615) Resp:  [17-19] 18 (09/30 0615) BP: (152-185)/(51-93) 160/56 mmHg (09/30 0615) SpO2:  [89 %-98 %] 98 % (09/30 0615) Weight:  [207 lb 3.7 oz (94 kg)] 207 lb 3.7 oz (94 kg) (09/30 0720) Last BM Date: 12/22/12  Intake/Output from previous day: 09/29 0701 - 09/30 0700 In: 733.3 [I.V.:733.3] Out: 720 [Urine:420; Emesis/NG output:300] Intake/Output this shift:    PE: Abd: Soft, LUQ moderate tenderness, nondistended, NABS, vertical healed scar left of midline Pulm: normal effort CV: RRR by 2+ bilateral radial pulse  Lab Results:   Recent Labs  12/22/12 1754 12/23/12 0635  WBC 9.6 6.9  HGB 11.3* 10.7*  HCT 33.1* 32.5*  PLT 189 171   BMET  Recent Labs  12/22/12 0934 12/22/12 1754 12/23/12 0635  NA 139  --  143  K 5.2*  --  4.8  CL 105  --  110  CO2 21  --  22  GLUCOSE 227*  --  151*  BUN 49*  --  38*  CREATININE 1.70* 1.51* 1.60*  CALCIUM 9.5  --  9.1   PT/INR No results found for this basename: LABPROT, INR,  in the last 72 hours CMP     Component Value Date/Time   NA 143 12/23/2012 0635   K 4.8 12/23/2012 0635   CL 110 12/23/2012 0635   CO2 22 12/23/2012 0635   GLUCOSE 151* 12/23/2012 0635   BUN 38* 12/23/2012 0635   CREATININE 1.60* 12/23/2012 0635   CALCIUM 9.1 12/23/2012 0635   PROT 7.4 12/22/2012 0934   ALBUMIN 4.3 12/22/2012 0934   AST 16 12/22/2012 0934   ALT 13 12/22/2012 0934   ALKPHOS 60 12/22/2012 0934   BILITOT 0.5 12/22/2012 0934   GFRNONAA 40* 12/23/2012 0635   GFRAA 46* 12/23/2012 0635   Lipase     Component Value Date/Time   LIPASE 25 12/22/2012 0934       Studies/Results: Ct Abdomen Pelvis Wo  Contrast  12/22/2012   CLINICAL DATA:  Abdominal pain, vomiting, history of bypass surgery  EXAM: CT ABDOMEN AND PELVIS WITHOUT CONTRAST  TECHNIQUE: Multidetector CT imaging of the abdomen and pelvis was performed following the standard protocol without intravenous contrast.  COMPARISON:  None.  FINDINGS: Lung bases are unremarkable.  Sagittal images of the spine shows postsurgical changes at L2, L3, L4 and L5 level. There is mild dextroscoliosis of the lumbar spine. Degenerative changes are noted lumbar spine.  Unenhanced liver shows no biliary ductal dilatation. At least 2 calcified gallstones are noted within gallbladder neck region the largest measures 8.4 mm. No pericholecystic fluid.  The study is limited without IV contrast. Atherosclerotic calcifications of abdominal aorta and iliac arteries. No aortic aneurysm. No nephrolithiasis. No hydronephrosis or hydroureter. Unenhanced kidneys shows mild perinephric stranding. There is bilateral renal cortical thinning and cortical scarring probable due to atrophy.  Some contrast material noted in proximal stomach.  There are distended small bowel loops in left upper abdomen with multiple air-fluid levels. In axial image 40 there is a distended small bowel loops with fecal like material. After this point there is small caliber of the small bowel as seen in coronal image 22.  Findings are highly suspicious for small bowel obstruction. The distal small bowel is empty small caliber. No any contrast material noted in distal small bowel or within colon. There is some stool in the right colon. Moderate stool noted in sigmoid colon. Sigmoid colon diverticula are noted. No evidence of acute diverticulitis. Mild enlarged prostate gland with indentation of urinary bladder base. Mild distended urinary bladder. Pelvic phleboliths are noted. There is no pericecal inflammation. Normal appendix is clearly visualize in axial image 61.  IMPRESSION: 1. There are distended small bowel  loops in left upper abdomen with multiple air-fluid levels. There is transition point in caliber of the small bowel in axial image 41. Findings are highly suspicious for small bowel obstruction. 2. At least 2 calcified gallstones are noted within gallbladder the largest measures 8.2 mm. 3. Postsurgical changes lumbar spine. 4. No ascites or free air. 5. Moderate stool noted in sigmoid colon. Multiple sigmoid colon diverticula. No evidence of acute diverticulitis. 6. There is an enlarged prostate gland with mild indentation of urinary bladder base. 7. No pericecal inflammation. Normal appendix is partially visualized.   Electronically Signed   By: Natasha Mead   On: 12/22/2012 12:53   Dg Abd 2 Views  12/23/2012   CLINICAL DATA:  Small bowel obstruction.  EXAM: ABDOMEN - 2 VIEW  COMPARISON:  CT scan dated 12/22/2012  FINDINGS: Fluid filled and dilated small bowel loops persist in the left mid abdomen with stairstep air-fluid levels consistent with small bowel obstruction.  NG tube tip is at the gastroesophageal junction and needs to be advanced.  Distal small bowel and colon are not distended.  IMPRESSION: Persistent small bowel obstruction. NG tube tip is at the gastroesophageal junction and needs to be advanced.   Electronically Signed   By: Geanie Cooley   On: 12/23/2012 08:05    Anti-infectives: Anti-infectives   None       Assessment/Plan 1. SBO 2. Multiple medical problems including DM2, PVD, CKD III, CAD s/p CABG, LV dysfunction, HLD, HTN - being managed by medical team  Plan: - Pain improving but still tender, still with no gas or BM since 2 nights ago - Continue conservative management with NPO and NG tube decompression - Xray today showing partial SBO - Monitor NG ffor appropriate output (nurse advanced tube per rads recommendation)   LOS: 1 day    Leona Singleton, MD 12/23/2012, 8:57 AM Pager: [phone]

## 2012-12-23 NOTE — Progress Notes (Signed)
Patient not improved much from admission yesterday afternoon.  No flatus yet X-ray still with dilated SB loops.  Continue NG suction for now.  If no improvement tomorrow, will need to consider exploratory laparotomy with lysis of adhesions.  [name]. [name] Skains, MD, [facility] Surgery  General/ Trauma Surgery  12/23/2012 11:19 AM

## 2012-12-24 ENCOUNTER — Inpatient Hospital Stay (HOSPITAL_COMMUNITY): Payer: Medicare Other

## 2012-12-24 DIAGNOSIS — K56609 Unspecified intestinal obstruction, unspecified as to partial versus complete obstruction: Secondary | ICD-10-CM

## 2012-12-24 DIAGNOSIS — I2589 Other forms of chronic ischemic heart disease: Secondary | ICD-10-CM

## 2012-12-24 LAB — GLUCOSE, CAPILLARY
Glucose-Capillary: 139 mg/dL — ABNORMAL HIGH (ref 70–99)
Glucose-Capillary: 130 mg/dL — ABNORMAL HIGH (ref 70–99)
Glucose-Capillary: 94 mg/dL (ref 70–99)
Glucose-Capillary: 159 mg/dL — ABNORMAL HIGH (ref 70–99)

## 2012-12-24 MED ORDER — BISACODYL 10 MG RE SUPP
10.0000 mg | Freq: Once | RECTAL | Status: AC
  Administered 2012-12-24: 10 mg via RECTAL
  Filled 2012-12-24: qty 1

## 2012-12-24 NOTE — Progress Notes (Signed)
[facility] PROGRESS NOTE  [name] ZOX:[id number] DOB: May 30, 1932 DOA: 12/22/2012 PCP: No primary provider on file.  Assessment/Plan: SBO  Likely from adhesions from previous surgeries.  -CT abdomen: There are distended small bowel loops in left upper abdomen with  multiple air-fluid levels.  -N/G in place for decompression.  -NPO  -Surgery following  HTN  -Well controlled on home medication regimen  [facility] Cardiology consulted   CAD  -Patient reported he currently has 3 blockages and a partially broken stint  -Followed by [facility] Cardiology; Dr. Bobette Mo  -Cardiology consulted-- see note     CKD  -Followed by Dr. Arrie Aran  -Creatnine is slightly above baseline today at 2.0. Baseline is 1.7.  -Will monitor with daily BMPs at this time   CHF  -Appears slightly decompensated with lower ext edema  -Will restrict fluid intake and hold lasix for now.   DM  -Well controlled on glipizide at home however he did not take it this morning since he hasn't eaten today.  -Will start on Insulin inpatient - SSI-S  -CBG was 161 in ED     HPI/Subjective: Had BM today Wants to go home  Objective: Filed Vitals:   12/24/12 0505  BP: 161/61  Pulse: 72  Temp: 98.7 F (37.1 C)  Resp: 16    Intake/Output Summary (Last 24 hours) at 12/24/12 1058 Last data filed at 12/24/12 0509  Gross per 24 hour  Intake   1570 ml  Output   2150 ml  Net   -580 ml   Filed Weights   12/23/12 0720  Weight: 94 kg (207 lb 3.7 oz)    Exam:   General:  A+Ox3, NAd- NG tube in place  Cardiovascular: rrr  Respiratory: clear  Abdomen: +BS, soft  Musculoskeletal: moves all 4 ext   Data Reviewed: Basic Metabolic Panel:  Recent Labs Lab 12/22/12 0934 12/22/12 1754 12/23/12 0635  NA 139  --  143  K 5.2*  --  4.8  CL 105  --  110  CO2 21  --  22  GLUCOSE 227*  --  151*  BUN 49*  --  38*  CREATININE 1.70* 1.51* 1.60*  CALCIUM 9.5  --  9.1   Liver Function  Tests:  Recent Labs Lab 12/22/12 0934  AST 16  ALT 13  ALKPHOS 60  BILITOT 0.5  PROT 7.4  ALBUMIN 4.3    Recent Labs Lab 12/22/12 0934  LIPASE 25   No results found for this basename: AMMONIA,  in the last 168 hours CBC:  Recent Labs Lab 12/22/12 0934 12/22/12 1754 12/23/12 0635  WBC 10.0 9.6 6.9  NEUTROABS 7.9*  --   --   HGB 11.0* 11.3* 10.7*  HCT 31.9* 33.1* 32.5*  MCV 94.4 94.8 97.0  PLT 194 189 171   Cardiac Enzymes: No results found for this basename: CKTOTAL, CKMB, CKMBINDEX, TROPONINI,  in the last 168 hours BNP (last 3 results) No results found for this basename: PROBNP,  in the last 8760 hours CBG:  Recent Labs Lab 12/23/12 1726 12/23/12 1957 12/23/12 2345 12/24/12 0459 12/24/12 0859  GLUCAP 134* 114* 141* 139* 142*    No results found for this or any previous visit (from the past 240 hour(s)).   Studies: Ct Abdomen Pelvis Wo Contrast  12/22/2012   CLINICAL DATA:  Abdominal pain, vomiting, history of bypass surgery  EXAM: CT ABDOMEN AND PELVIS WITHOUT CONTRAST  TECHNIQUE: Multidetector CT imaging of the abdomen and pelvis was performed  following the standard protocol without intravenous contrast.  COMPARISON:  None.  FINDINGS: Lung bases are unremarkable.  Sagittal images of the spine shows postsurgical changes at L2, L3, L4 and L5 level. There is mild dextroscoliosis of the lumbar spine. Degenerative changes are noted lumbar spine.  Unenhanced liver shows no biliary ductal dilatation. At least 2 calcified gallstones are noted within gallbladder neck region the largest measures 8.4 mm. No pericholecystic fluid.  The study is limited without IV contrast. Atherosclerotic calcifications of abdominal aorta and iliac arteries. No aortic aneurysm. No nephrolithiasis. No hydronephrosis or hydroureter. Unenhanced kidneys shows mild perinephric stranding. There is bilateral renal cortical thinning and cortical scarring probable due to atrophy.  Some contrast  material noted in proximal stomach.  There are distended small bowel loops in left upper abdomen with multiple air-fluid levels. In axial image 40 there is a distended small bowel loops with fecal like material. After this point there is small caliber of the small bowel as seen in coronal image 22. Findings are highly suspicious for small bowel obstruction. The distal small bowel is empty small caliber. No any contrast material noted in distal small bowel or within colon. There is some stool in the right colon. Moderate stool noted in sigmoid colon. Sigmoid colon diverticula are noted. No evidence of acute diverticulitis. Mild enlarged prostate gland with indentation of urinary bladder base. Mild distended urinary bladder. Pelvic phleboliths are noted. There is no pericecal inflammation. Normal appendix is clearly visualize in axial image 61.  IMPRESSION: 1. There are distended small bowel loops in left upper abdomen with multiple air-fluid levels. There is transition point in caliber of the small bowel in axial image 41. Findings are highly suspicious for small bowel obstruction. 2. At least 2 calcified gallstones are noted within gallbladder the largest measures 8.2 mm. 3. Postsurgical changes lumbar spine. 4. No ascites or free air. 5. Moderate stool noted in sigmoid colon. Multiple sigmoid colon diverticula. No evidence of acute diverticulitis. 6. There is an enlarged prostate gland with mild indentation of urinary bladder base. 7. No pericecal inflammation. Normal appendix is partially visualized.   Electronically Signed   By: Natasha Mead   On: 12/22/2012 12:53   Dg Abd 2 Views  12/24/2012   CLINICAL DATA:  Followup small bowel obstruction  EXAM: ABDOMEN - 2 VIEW  COMPARISON:  12/23/2012  FINDINGS: There are no residual dilated loops of small bowel. There are no air-fluid levels on the erect view. Stool and air is seen within a nondistended colon.  Nasogastric tube tip is in the distal stomach.  Degenerative  changes are noted of the mid and lower lumbar spine.  IMPRESSION: Resolved small bowel obstruction.   Electronically Signed   By: Amie Portland   On: 12/24/2012 08:20   Dg Abd 2 Views  12/23/2012   CLINICAL DATA:  Small bowel obstruction.  EXAM: ABDOMEN - 2 VIEW  COMPARISON:  CT scan dated 12/22/2012  FINDINGS: Fluid filled and dilated small bowel loops persist in the left mid abdomen with stairstep air-fluid levels consistent with small bowel obstruction.  NG tube tip is at the gastroesophageal junction and needs to be advanced.  Distal small bowel and colon are not distended.  IMPRESSION: Persistent small bowel obstruction. NG tube tip is at the gastroesophageal junction and needs to be advanced.   Electronically Signed   By: Geanie Cooley   On: 12/23/2012 08:05    Scheduled Meds: . insulin aspart  0-9 Units Subcutaneous Q4H  .  metoprolol  10 mg Intravenous Q6H  . nitroGLYCERIN  0.5 inch Topical Q6H  . pantoprazole (PROTONIX) IV  40 mg Intravenous QHS   Continuous Infusions: . sodium chloride 50 mL/hr at 12/23/12 2333    Principal Problem:   SBO (small bowel obstruction) Active Problems:   HYPERTENSION   CAD - CABG '94. PCI attempt 2012, Myoview low risk Jan 2013   Cardiomyopathy, ischemic- last EF improved to 55-60% 2D 12/23/12   LBBB (left bundle branch block)   Hyperlipemia   PVD - RCE '09   Chronic renal insufficiency, stage III (moderate)- follwed by Dr Abel Presto   DM2 (diabetes mellitus, type 2)    Time spent: 53    Marlin Canary  [facility] Pager [phone]. If 7PM-7AM, please contact night-coverage at [email or web address], password [facility] 12/24/2012, 10:58 AM  LOS: 2 days

## 2012-12-24 NOTE — Clinical Documentation Improvement (Signed)
THIS DOCUMENT IS NOT A PERMANENT PART OF THE MEDICAL RECORD  Please update your documentation with the medical record to reflect your response to this query. If you need help knowing how to do this please call [phone].  12/24/12  Dear Dr. Thedore Mins / Associates,  In a better effort to capture your patient's severity of illness, reflect appropriate length of stay and utilization of resources, a review of the patient medical record has revealed the following: CHF - appears slightly decompensated with lower extremity edema.   ECHO results noting EF is between 55 to 60%; has grade I diastolic dysfunction  Being treated with IV Lopressor Q6H    Based on your clinical judgment, please clarify and document in progress note and discharge summary the: type and acuity of patient's CHF if clinically significant.   E.g. Acute, chronic, acute on chronic and whether or not it's Systolic, Diastolic or both   If you disagree with query, please document that in the progress note as well.   In responding to this query please exercise your independent judgment.  The fact that a query is asked, does not imply that any particular answer is desired or expected.   Reviewed:  no additional documentation provided   Thank Lucilla Edin  Clinical Documentation Specialist: [phone] Health Information Management [place]

## 2012-12-24 NOTE — Progress Notes (Signed)
Subjective:  No chest pain or SOB.  Objective:  Vital Signs in the last 24 hours: Temp:  [97.9 F (36.6 C)-99.3 F (37.4 C)] 98.7 F (37.1 C) (10/01 0505) Pulse Rate:  [67-73] 72 (10/01 0505) Resp:  [16-19] 16 (10/01 0505) BP: (143-168)/(56-98) 161/61 mmHg (10/01 0505) SpO2:  [92 %-94 %] 92 % (10/01 0505)  Intake/Output from previous day:  Intake/Output Summary (Last 24 hours) at 12/24/12 0908 Last data filed at 12/24/12 0509  Gross per 24 hour  Intake   1570 ml  Output   2150 ml  Net   -580 ml   . insulin aspart  0-9 Units Subcutaneous Q4H  . metoprolol  10 mg Intravenous Q6H  . nitroGLYCERIN  0.5 inch Topical Q6H  . pantoprazole (PROTONIX) IV  40 mg Intravenous QHS   . sodium chloride 50 mL/hr at 12/23/12 2333    Physical Exam: General appearance: alert, cooperative and no distress Lungs: clear to auscultation bilaterally Heart: regular rate and rhythm Abdomen: soft, NG in place, few bowel sounds   Rate: 72  Rhythm: normal sinus rhythm  Lab Results:  Recent Labs  12/22/12 1754 12/23/12 0635  WBC 9.6 6.9  HGB 11.3* 10.7*  PLT 189 171    Recent Labs  12/22/12 0934 12/22/12 1754 12/23/12 0635  NA 139  --  143  K 5.2*  --  4.8  CL 105  --  110  CO2 21  --  22  GLUCOSE 227*  --  151*  BUN 49*  --  38*  CREATININE 1.70* 1.51* 1.60*   No results found for this basename: TROPONINI, CK, MB,  in the last 72 hours No results found for this basename: INR,  in the last 72 hours  Imaging: Imaging results have been reviewed  Cardiac Studies:  Assessment/Plan:   Principal Problem:   SBO (small bowel obstruction) Active Problems:   CAD - CABG '94. PCI attempt 2012, Myoview low risk Jan 2013   Cardiomyopathy, ischemic- last EF improved to 55-60% 2D 12/23/12   Chronic renal insufficiency, stage III (moderate)- follwed by Dr Abel Presto   DM2 (diabetes mellitus, type 2)   HYPERTENSION   LBBB (left bundle branch block)   Hyperlipemia   PVD - RCE  '09    PLAN: His echo shows resolution of his ischemic cardiomyopathy. Cardiac status is stable, continue current Rx. His B/P is a little high but he is not getting his po antihypertensives. Hopefully he will not require surgery.   [name] PA-C Beeper [phone] 12/24/2012, 9:08 AM  Echo Study Conclusions  - Left ventricle: The cavity size was normal. There was mild concentric hypertrophy. Systolic function was normal. The estimated ejection fraction was in the range of 55% to 60%. Doppler parameters are consistent with abnormal left ventricular relaxation (grade 1 diastolic dysfunction). - Mitral valve: Calcified annulus. Trivial regurgitation. - Left atrium: The atrium was at the upper limits of normal in size (27 ml/m2). - Tricuspid valve: Moderate regurgitation. - Pulmonary arteries: PA peak pressure: 54mm Hg (S). - Inferior vena cava: The vessel was dilated; the respirophasic diameter changes were blunted (< 50%); findings are consistent with elevated central venous pressure.   Patient seen and examined. Agree with assessment and plan.  LV function improved. BS+, passed flatus, SBO is resolving. Resume oral meds when OK from GI perspective.   Lennette Bihari, MD, [facility] 12/24/2012 10:23 AM

## 2012-12-24 NOTE — Progress Notes (Signed)
Subjective: Pt states he is feeling much better today.  Started passing flatus last evening, no bm yet.  Had a dream last night and pulled out IV.  Received ativan last night.  Objective: Vital signs in last 24 hours: Temp:  [97.9 F (36.6 C)-99.3 F (37.4 C)] 98.7 F (37.1 C) (10/01 0505) Pulse Rate:  [67-73] 72 (10/01 0505) Resp:  [16-19] 16 (10/01 0505) BP: (143-168)/(56-98) 161/61 mmHg (10/01 0505) SpO2:  [92 %-94 %] 92 % (10/01 0505) Last BM Date: 12/22/12  Intake/Output from previous day: 09/30 0701 - 10/01 0700 In: 1570 [I.V.:1220; NG/GT:350] Out: 2650 [Urine:1800; Emesis/NG output:850] Intake/Output this shift:    General appearance: alert, cooperative, appears stated age and no distress GI: soft, LUQ tenderness, bowel sounds normal; no masses,  no organomegaly  Lab Results:   Recent Labs  12/22/12 1754 12/23/12 0635  WBC 9.6 6.9  HGB 11.3* 10.7*  HCT 33.1* 32.5*  PLT 189 171   BMET  Recent Labs  12/22/12 0934 12/22/12 1754 12/23/12 0635  NA 139  --  143  K 5.2*  --  4.8  CL 105  --  110  CO2 21  --  22  GLUCOSE 227*  --  151*  BUN 49*  --  38*  CREATININE 1.70* 1.51* 1.60*  CALCIUM 9.5  --  9.1   Studies/Results: Ct Abdomen Pelvis Wo Contrast  12/22/2012   CLINICAL DATA:  Abdominal pain, vomiting, history of bypass surgery  EXAM: CT ABDOMEN AND PELVIS WITHOUT CONTRAST  TECHNIQUE: Multidetector CT imaging of the abdomen and pelvis was performed following the standard protocol without intravenous contrast.  COMPARISON:  None.  FINDINGS: Lung bases are unremarkable.  Sagittal images of the spine shows postsurgical changes at L2, L3, L4 and L5 level. There is mild dextroscoliosis of the lumbar spine. Degenerative changes are noted lumbar spine.  Unenhanced liver shows no biliary ductal dilatation. At least 2 calcified gallstones are noted within gallbladder neck region the largest measures 8.4 mm. No pericholecystic fluid.  The study is limited without  IV contrast. Atherosclerotic calcifications of abdominal aorta and iliac arteries. No aortic aneurysm. No nephrolithiasis. No hydronephrosis or hydroureter. Unenhanced kidneys shows mild perinephric stranding. There is bilateral renal cortical thinning and cortical scarring probable due to atrophy.  Some contrast material noted in proximal stomach.  There are distended small bowel loops in left upper abdomen with multiple air-fluid levels. In axial image 40 there is a distended small bowel loops with fecal like material. After this point there is small caliber of the small bowel as seen in coronal image 22. Findings are highly suspicious for small bowel obstruction. The distal small bowel is empty small caliber. No any contrast material noted in distal small bowel or within colon. There is some stool in the right colon. Moderate stool noted in sigmoid colon. Sigmoid colon diverticula are noted. No evidence of acute diverticulitis. Mild enlarged prostate gland with indentation of urinary bladder base. Mild distended urinary bladder. Pelvic phleboliths are noted. There is no pericecal inflammation. Normal appendix is clearly visualize in axial image 61.  IMPRESSION: 1. There are distended small bowel loops in left upper abdomen with multiple air-fluid levels. There is transition point in caliber of the small bowel in axial image 41. Findings are highly suspicious for small bowel obstruction. 2. At least 2 calcified gallstones are noted within gallbladder the largest measures 8.2 mm. 3. Postsurgical changes lumbar spine. 4. No ascites or free air. 5. Moderate stool noted in  sigmoid colon. Multiple sigmoid colon diverticula. No evidence of acute diverticulitis. 6. There is an enlarged prostate gland with mild indentation of urinary bladder base. 7. No pericecal inflammation. Normal appendix is partially visualized.   Electronically Signed   By: Natasha Mead   On: 12/22/2012 12:53   Dg Abd 2 Views  12/23/2012   CLINICAL  DATA:  Small bowel obstruction.  EXAM: ABDOMEN - 2 VIEW  COMPARISON:  CT scan dated 12/22/2012  FINDINGS: Fluid filled and dilated small bowel loops persist in the left mid abdomen with stairstep air-fluid levels consistent with small bowel obstruction.  NG tube tip is at the gastroesophageal junction and needs to be advanced.  Distal small bowel and colon are not distended.  IMPRESSION: Persistent small bowel obstruction. NG tube tip is at the gastroesophageal junction and needs to be advanced.   Electronically Signed   By: Geanie Cooley   On: 12/23/2012 08:05    Assessment/Plan: 1. SBO  2. Multiple medical problems including DM2, PVD, CKD III, CAD s/p CABG, LV dysfunction, HLD, HTN - being managed by medical team   Plan:  -now passing flatus, less tender and abdomen is soft.  Continue with conservative management.  Give dulcolax suppository today.   -875ml/bilious output, 2 sups of ice. Await XR. Will discuss if okay to clamp given new flatus. -mobilize -? VTE prophylaxis     LOS: 2 days   [name] [facility] ANP-BC Pager [phone]  12/24/2012 8:07 AM

## 2012-12-24 NOTE — Progress Notes (Signed)
Passing flatus Today's films show resolution of SBO. Dulcolax suppository  Clamp NG tube - possible remove tube later today. Wilmon Arms. Corliss Skains, MD, [facility]  General/ Trauma Surgery  12/24/2012 9:33 AM

## 2012-12-25 LAB — GLUCOSE, CAPILLARY
Glucose-Capillary: 107 mg/dL — ABNORMAL HIGH (ref 70–99)
Glucose-Capillary: 154 mg/dL — ABNORMAL HIGH (ref 70–99)

## 2012-12-25 MED ORDER — AMLODIPINE BESYLATE 10 MG PO TABS
10.0000 mg | ORAL_TABLET | Freq: Every day | ORAL | Status: DC
  Administered 2012-12-25: 10 mg via ORAL
  Filled 2012-12-25: qty 1

## 2012-12-25 MED ORDER — ISOSORBIDE MONONITRATE ER 60 MG PO TB24
60.0000 mg | ORAL_TABLET | Freq: Every day | ORAL | Status: DC
  Administered 2012-12-25: 60 mg via ORAL
  Filled 2012-12-25: qty 1

## 2012-12-25 MED ORDER — CARVEDILOL 25 MG PO TABS
25.0000 mg | ORAL_TABLET | Freq: Two times a day (BID) | ORAL | Status: DC
  Administered 2012-12-25: 25 mg via ORAL
  Filled 2012-12-25: qty 1

## 2012-12-25 MED ORDER — LOSARTAN POTASSIUM 50 MG PO TABS
50.0000 mg | ORAL_TABLET | Freq: Two times a day (BID) | ORAL | Status: DC
  Administered 2012-12-25: 50 mg via ORAL
  Filled 2012-12-25: qty 1

## 2012-12-25 MED ORDER — HYDRALAZINE HCL 25 MG PO TABS
25.0000 mg | ORAL_TABLET | Freq: Two times a day (BID) | ORAL | Status: DC
  Administered 2012-12-25: 25 mg via ORAL
  Filled 2012-12-25: qty 1

## 2012-12-25 NOTE — Progress Notes (Signed)
NURSING PROGRESS NOTE  [name] [id number] Discharge Data: 12/25/2012 2:43 PM Attending Provider: No att. providers found PCP:No primary provider on file.     [name] to be D/C'd Home per MD order.  Discussed with the patient the After Visit Summary and all questions fully answered. All IV's discontinued with no bleeding noted. All belongings returned to patient for patient to take home.   Last Vital Signs:  Blood pressure 129/65, pulse 66, temperature 97.9 F (36.6 C), temperature source Oral, resp. rate 18, height 5\' 11"  (1.803 m), weight 94.3 kg (207 lb 14.3 oz), SpO2 95.00%.  Discharge Medication List   Medication List         acetaminophen 325 MG tablet  Commonly known as:  TYLENOL  Take 650 mg by mouth every 6 (six) hours as needed for pain.     amLODipine 10 MG tablet  Commonly known as:  NORVASC  Take 10 mg by mouth daily.     aspirin EC 81 MG tablet  Take 81 mg by mouth at bedtime.     carvedilol 25 MG tablet  Commonly known as:  COREG  Take 25 mg by mouth 2 (two) times daily with a meal.     clopidogrel 75 MG tablet  Commonly known as:  PLAVIX  Take 1 tablet (75 mg total) by mouth daily.     diphenhydrAMINE 25 MG tablet  Commonly known as:  BENADRYL  Take 25 mg by mouth daily.     FOLIC ACID PO  Take 600 mcg by mouth daily.     furosemide 40 MG tablet  Commonly known as:  LASIX  Take 40 mg by mouth daily.     glipiZIDE 10 MG tablet  Commonly known as:  GLUCOTROL  Take 20 mg by mouth 2 (two) times daily.     hydrALAZINE 25 MG tablet  Commonly known as:  APRESOLINE  Take 25 mg by mouth 2 (two) times daily.     isosorbide mononitrate 60 MG 24 hr tablet  Commonly known as:  IMDUR  Take 60 mg by mouth daily.     loratadine 10 MG tablet  Commonly known as:  CLARITIN  Take 10 mg by mouth daily.     losartan 100 MG tablet  Commonly known as:  COZAAR  Take 50 mg by mouth 2 (two) times daily.     MOVE FREE PO  Take 1 tablet by  mouth 2 (two) times daily.     multivitamins ther. w/minerals Tabs tablet  Take 1 tablet by mouth daily.     omeprazole 20 MG capsule  Commonly known as:  PRILOSEC  Take 20 mg by mouth 2 (two) times daily.     pravastatin 40 MG tablet  Commonly known as:  PRAVACHOL  Take 40 mg by mouth at bedtime.

## 2012-12-25 NOTE — Progress Notes (Signed)
[facility] PROGRESS NOTE  [name] WUJ:[id number] DOB: [birth year] DOA: 12/22/2012 PCP: No primary provider on file.  Assessment/Plan: SBO  Likely from adhesions from previous surgeries.  -CT abdomen: There are distended small bowel loops in left upper abdomen with  multiple air-fluid levels. -Surgery following +BM  HTN  -resumed home meds- check BP 1 week with PCP  CAD  -Patient reported he currently has 3 blockages and a partially broken stint  -Followed by [facility]; Dr. Bobette Mo  -Cardiology consulted-- see note     CKD  -Followed by Dr. Arrie Aran  -Creatnine is slightly above baseline today at 2.0. Baseline is 1.7.  -Will monitor with daily BMPs at this time   CHF- chronic diastolic dfx -can resume lasix at d/c -BMP 1 week  DM  -Well controlled on glipizide at home however he did not take it this morning since he hasn't eaten today.  -Will start on Insulin inpatient - SSI-S  -CBG was 161 in ED  -can resume home meds at d/c  -will sign off, available for questions [phone]  HPI/Subjective:  Eating well  Objective: Filed Vitals:   12/25/12 0818  BP: 171/75  Pulse: 65  Temp: 98.5 F (36.9 C)  Resp: 16    Intake/Output Summary (Last 24 hours) at 12/25/12 0857 Last data filed at 12/25/12 0600  Gross per 24 hour  Intake 1602.5 ml  Output    200 ml  Net 1402.5 ml   Filed Weights   12/23/12 0720 12/25/12 0614  Weight: 94 kg (207 lb 3.7 oz) 94.3 kg (207 lb 14.3 oz)    Exam:   General:  A+Ox3, NAd- NG tube in place  Cardiovascular: rrr  Respiratory: clear  Abdomen: +BS, soft  Musculoskeletal: moves all 4 ext   Data Reviewed: Basic Metabolic Panel:  Recent Labs Lab 12/22/12 0934 12/22/12 1754 12/23/12 0635  NA 139  --  143  K 5.2*  --  4.8  CL 105  --  110  CO2 21  --  22  GLUCOSE 227*  --  151*  BUN 49*  --  38*  CREATININE 1.70* 1.51* 1.60*  CALCIUM 9.5  --  9.1   Liver Function Tests:  Recent  Labs Lab 12/22/12 0934  AST 16  ALT 13  ALKPHOS 60  BILITOT 0.5  PROT 7.4  ALBUMIN 4.3    Recent Labs Lab 12/22/12 0934  LIPASE 25   No results found for this basename: AMMONIA,  in the last 168 hours CBC:  Recent Labs Lab 12/22/12 0934 12/22/12 1754 12/23/12 0635  WBC 10.0 9.6 6.9  NEUTROABS 7.9*  --   --   HGB 11.0* 11.3* 10.7*  HCT 31.9* 33.1* 32.5*  MCV 94.4 94.8 97.0  PLT 194 189 171   Cardiac Enzymes: No results found for this basename: CKTOTAL, CKMB, CKMBINDEX, TROPONINI,  in the last 168 hours BNP (last 3 results) No results found for this basename: PROBNP,  in the last 8760 hours CBG:  Recent Labs Lab 12/24/12 1732 12/24/12 2004 12/24/12 2342 12/25/12 0357 12/25/12 0750  GLUCAP 94 159* 92 99 107*    No results found for this or any previous visit (from the past 240 hour(s)).   Studies: Dg Abd 2 Views  12/24/2012   CLINICAL DATA:  Followup small bowel obstruction  EXAM: ABDOMEN - 2 VIEW  COMPARISON:  12/23/2012  FINDINGS: There are no residual dilated loops of small bowel. There are no air-fluid levels on the erect  view. Stool and air is seen within a nondistended colon.  Nasogastric tube tip is in the distal stomach.  Degenerative changes are noted of the mid and lower lumbar spine.  IMPRESSION: Resolved small bowel obstruction.   Electronically Signed   By: Amie Portland   On: 12/24/2012 08:20    Scheduled Meds: . amLODipine  10 mg Oral Daily  . carvedilol  25 mg Oral BID WC  . hydrALAZINE  25 mg Oral BID  . insulin aspart  0-9 Units Subcutaneous Q4H  . isosorbide mononitrate  60 mg Oral Daily  . losartan  50 mg Oral BID  . pantoprazole (PROTONIX) IV  40 mg Intravenous QHS   Continuous Infusions: . sodium chloride 50 mL/hr at 12/24/12 2130    Principal Problem:   SBO (small bowel obstruction) Active Problems:   HYPERTENSION   CAD - CABG '94. PCI attempt 2012, Myoview low risk Jan 2013   Cardiomyopathy, ischemic- last EF improved to  55-60% 2D 12/23/12   LBBB (left bundle branch block)   Hyperlipemia   PVD - RCE '09   Chronic renal insufficiency, stage III (moderate)- follwed by Dr Abel Presto   DM2 (diabetes mellitus, type 2)    Time spent: 34    Marlin Canary  [facility] Pager [phone]. If 7PM-7AM, please contact night-coverage at [email or web address], password [facility] 12/25/2012, 8:57 AM  LOS: 3 days

## 2012-12-25 NOTE — Discharge Summary (Signed)
Physician Discharge Summary  Patient ID: [name] MRN: [id number] DOB/AGE: January 25, 1933 79 y.o.  Admit date: 12/22/2012 Discharge date: 12/25/2012  Admitting Diagnosis: Small Bowel Obstruction CAD HTN Chronic renal insufficiency Stage III DM type 2 HLD PVD  Discharge Diagnosis Patient Active Problem List   Diagnosis Date Noted  . DM2 (diabetes mellitus, type 2) 12/22/2012  . SBO (small bowel obstruction) 12/22/2012  . PVD - RCE '09 12/04/2012  . Chronic renal insufficiency, stage III (moderate)- follwed by Dr Abel Presto 12/04/2012  . Angina at rest 02/27/2011  . CAD - CABG '94. PCI attempt 2012, Myoview low risk Jan 2013 02/27/2011  . Cardiomyopathy, ischemic- last EF improved to 55-60% 2D 12/23/12 02/27/2011  . LBBB (left bundle branch block) 02/27/2011  . Hyperlipemia 02/27/2011  . HYPERTENSION 12/06/2008  . COUGH 12/06/2008    Consultants Internal Medicine (Dr. Thedore Mins, Dr. Benjamine Mola) Cardiology (Dr. Tresa Endo, Dr. Royann Shivers)  Imaging: Dg Abd 2 Views  12/24/2012   CLINICAL DATA:  Followup small bowel obstruction  EXAM: ABDOMEN - 2 VIEW  COMPARISON:  12/23/2012  FINDINGS: There are no residual dilated loops of small bowel. There are no air-fluid levels on the erect view. Stool and air is seen within a nondistended colon.  Nasogastric tube tip is in the distal stomach.  Degenerative changes are noted of the mid and lower lumbar spine.  IMPRESSION: Resolved small bowel obstruction.   Electronically Signed   By: Amie Portland   On: 12/24/2012 08:20    Procedures None  Hospital Course:  79 yo male with multiple medical problems who began having left sided abdominal pain the day prior to admission. He then developed nausea and vomiting with continued abdomina discomfort and bloating which is what brought him to [facility] He had a BM this morning. A CT scan that revealed a SBO with transition point. He had a history of a spinal surgery with anterior approach and abdominal  scar.  Patient was admitted for pain control and conservative treatment including bowel rest, IVF, and NG tube decompression.  [facility] hospitalist and cardiology were consulted to manage her chronic medical conditions.  He had trouble with HTN and renal failure.  His creatinine improved to 1.5-6 which is better than his baseline.  His abdominal pain, nausea, and bloating started to improve and we started NG clamping trials.  He tolerated this well and the NG tube was discontinued on HD #3.  He was started on clears liquids and tolerated that well.  He was restarted on his home medications.  Diet was advanced as tolerated.  On HD #4, the patient was voiding well, tolerating diet, ambulating well, pain well controlled, vital signs stable, and felt stable for discharge home.  Patient will follow up in our office as needed for worsening symptoms and knows to call with questions or concerns.  He has a follow up appointment with Dr. Hazle Coca office for a BP check in 1 week and he will f/u with them in 6 months.    Physical Exam: General:  Alert, NAD, pleasant, comfortable Abd:  Soft, ND, mild tenderness, no HSM, well healed paramedian scar in the left abdomen    Medication List         acetaminophen 325 MG tablet  Commonly known as:  TYLENOL  Take 650 mg by mouth every 6 (six) hours as needed for pain.     amLODipine 10 MG tablet  Commonly known as:  NORVASC  Take 10 mg by mouth daily.  aspirin EC 81 MG tablet  Take 81 mg by mouth at bedtime.     carvedilol 25 MG tablet  Commonly known as:  COREG  Take 25 mg by mouth 2 (two) times daily with a meal.     clopidogrel 75 MG tablet  Commonly known as:  PLAVIX  Take 1 tablet (75 mg total) by mouth daily.     diphenhydrAMINE 25 MG tablet  Commonly known as:  BENADRYL  Take 25 mg by mouth daily.     FOLIC ACID PO  Take 600 mcg by mouth daily.     furosemide 40 MG tablet  Commonly known as:  LASIX  Take 40 mg by mouth daily.     glipiZIDE  10 MG tablet  Commonly known as:  GLUCOTROL  Take 20 mg by mouth 2 (two) times daily.     hydrALAZINE 25 MG tablet  Commonly known as:  APRESOLINE  Take 25 mg by mouth 2 (two) times daily.     isosorbide mononitrate 60 MG 24 hr tablet  Commonly known as:  IMDUR  Take 60 mg by mouth daily.     loratadine 10 MG tablet  Commonly known as:  CLARITIN  Take 10 mg by mouth daily.     losartan 100 MG tablet  Commonly known as:  COZAAR  Take 50 mg by mouth 2 (two) times daily.     MOVE FREE PO  Take 1 tablet by mouth 2 (two) times daily.     multivitamins ther. w/minerals Tabs tablet  Take 1 tablet by mouth daily.     omeprazole 20 MG capsule  Commonly known as:  PRILOSEC  Take 20 mg by mouth 2 (two) times daily.     pravastatin 40 MG tablet  Commonly known as:  PRAVACHOL  Take 40 mg by mouth at bedtime.         Follow-up Information   Follow up with [name], MD In 1 week. (office will call for BP check and appointment in 6 months)    Specialty:  Cardiology   Contact information:   [address] [place] [address] [phone]       Call Wynona Luna., MD. (As needed if symptoms worsen)    Specialty:  General Surgery   Contact information:   [address] [place] [address] [phone]       Signed: Candiss Norse [facility] [phone]  12/25/2012, 10:08 AM

## 2012-12-25 NOTE — Progress Notes (Signed)
Subjective:  No abd pain, "eating everythingthing bring me".  Objective:  Vital Signs in the last 24 hours: Temp:  [98.2 F (36.8 C)-98.9 F (37.2 C)] 98.5 F (36.9 C) (10/02 0818) Pulse Rate:  [65-79] 65 (10/02 0818) Resp:  [16-18] 16 (10/02 0818) BP: (145-171)/(52-75) 171/75 mmHg (10/02 0818) SpO2:  [92 %-95 %] 95 % (10/02 0818) Weight:  [207 lb 14.3 oz (94.3 kg)] 207 lb 14.3 oz (94.3 kg) (10/02 0614)  Intake/Output from previous day:  Intake/Output Summary (Last 24 hours) at 12/25/12 0848 Last data filed at 12/25/12 0600  Gross per 24 hour  Intake 1602.5 ml  Output    200 ml  Net 1402.5 ml    Physical Exam: General appearance: alert, cooperative and no distress Lungs: clear to auscultation bilaterally Heart: regular rate and rhythm Abdomen: positive bowel sounds   Rate: 65  Rhythm: normal sinus rhythm  Lab Results:  Recent Labs  12/22/12 1754 12/23/12 0635  WBC 9.6 6.9  HGB 11.3* 10.7*  PLT 189 171    Recent Labs  12/22/12 0934 12/22/12 1754 12/23/12 0635  NA 139  --  143  K 5.2*  --  4.8  CL 105  --  110  CO2 21  --  22  GLUCOSE 227*  --  151*  BUN 49*  --  38*  CREATININE 1.70* 1.51* 1.60*   No results found for this basename: TROPONINI, CK, MB,  in the last 72 hours No results found for this basename: INR,  in the last 72 hours  Imaging: Imaging results have been reviewed  Cardiac Studies:  Assessment/Plan:   Principal Problem:   SBO (small bowel obstruction) Active Problems:   CAD - CABG '94. PCI attempt 2012, Myoview low risk Jan 2013   Cardiomyopathy, ischemic- last EF improved to 55-60% 2D 12/23/12   Chronic renal insufficiency, stage III (moderate)- follwed by Dr Abel Presto   DM2 (diabetes mellitus, type 2)   HYPERTENSION   LBBB (left bundle branch block)   Hyperlipemia   PVD - RCE '09    PLAN: PO meds resumed. He will keep his follow up with Dr Allyson Sabal in 6 months as scheduled. B/P check in one week.  [name]  PA-C Beeper [phone] 12/25/2012, 8:48 AM

## 2012-12-25 NOTE — Discharge Summary (Signed)
Patient much improved. Will try regular diet for lunch, then discharge this afternoon.  [name]. [name] Skains, MD, [facility]  General/ Trauma Surgery  12/25/2012 10:42 AM

## 2012-12-25 NOTE — Clinical Documentation Improvement (Signed)
THIS DOCUMENT IS NOT A PERMANENT PART OF THE MEDICAL RECORD  Please update your documentation with the medical record to reflect your response to this query. If you need help knowing how to do this please call [phone].  12/25/12  Dear Dr. Benjamine Mola / Associates,  In a better effort to capture your patient's severity of illness, reflect appropriate length of stay and utilization of resources, a review of the patient medical record has revealed the following: CHF appears slightly decompensated with lower extremity edema.   ECHO this admit has an EF of 55-60%  Grade I  Diastolic dysfunction  Being treated with PO Beta Blocker and Imdur daily    Based on your clinical judgment, please clarify and document in a progress note and discharge summary the:   Type - systolic, diastolic, systolic and diastolic  Acuity of - acute, chronic, acute on chronic - acute = IV medications; chronic PO meds  In responding to this query please exercise your independent judgment.  The fact that a query is asked, does not imply that any particular answer is desired or expected.    Reviewed: Dr. Benjamine Mola documented chronic diastolic chf in 10/2 progress note.  Thank You,  Shellee Milo  Clinical Documentation Specialist: [phone] Health Information Management [place]

## 2013-03-30 ENCOUNTER — Telehealth (HOSPITAL_COMMUNITY): Payer: Self-pay | Admitting: *Deleted

## 2013-04-01 ENCOUNTER — Encounter (HOSPITAL_COMMUNITY): Payer: Medicare Other

## 2013-04-03 ENCOUNTER — Other Ambulatory Visit: Payer: Self-pay | Admitting: Dermatology

## 2013-04-08 ENCOUNTER — Ambulatory Visit (HOSPITAL_BASED_OUTPATIENT_CLINIC_OR_DEPARTMENT_OTHER)
Admission: RE | Admit: 2013-04-08 | Discharge: 2013-04-08 | Disposition: A | Discharge status: Home / Self Care | Payer: Medicare Other | Source: Ambulatory Visit | Attending: Cardiovascular Disease | Admitting: Cardiovascular Disease

## 2013-04-08 ENCOUNTER — Other Ambulatory Visit (HOSPITAL_COMMUNITY): Payer: Self-pay | Admitting: Cardiovascular Disease

## 2013-04-08 ENCOUNTER — Ambulatory Visit (HOSPITAL_COMMUNITY)
Admission: RE | Admit: 2013-04-08 | Discharge: 2013-04-08 | Disposition: A | Discharge status: Home / Self Care | Payer: Medicare Other | Source: Ambulatory Visit | Attending: Cardiovascular Disease | Admitting: Cardiovascular Disease

## 2013-04-08 DIAGNOSIS — I6529 Occlusion and stenosis of unspecified carotid artery: Secondary | ICD-10-CM

## 2013-04-08 DIAGNOSIS — I701 Atherosclerosis of renal artery: Secondary | ICD-10-CM

## 2013-04-08 DIAGNOSIS — I6509 Occlusion and stenosis of unspecified vertebral artery: Secondary | ICD-10-CM | POA: Insufficient documentation

## 2013-04-08 NOTE — Progress Notes (Signed)
Renal Artery Duplex Completed. °Brianna L Mazza,RVT °

## 2013-04-08 NOTE — Progress Notes (Signed)
Carotid Duplex Completed. °Brianna L Mazza,RVT °

## 2013-04-13 ENCOUNTER — Ambulatory Visit (INDEPENDENT_AMBULATORY_CARE_PROVIDER_SITE_OTHER): Payer: Medicare Other | Admitting: Surgery

## 2013-04-13 ENCOUNTER — Encounter (INDEPENDENT_AMBULATORY_CARE_PROVIDER_SITE_OTHER): Payer: Self-pay | Admitting: Surgery

## 2013-04-13 VITALS — BP 136/70 | HR 60 | Temp 97.0°F | Resp 14 | Ht 71.5 in | Wt 207.4 lb

## 2013-04-13 DIAGNOSIS — R109 Unspecified abdominal pain: Secondary | ICD-10-CM

## 2013-04-13 NOTE — Patient Instructions (Signed)
NO NEED FOR SURGERY NOW.  YOU HAVE GALLSTONES.  CALL IF YOU HAVE ANY OTHER PROBLEMS.

## 2013-04-13 NOTE — Progress Notes (Signed)
Patient ID: [name] CorneaMarshall G Curran, male   DOB: 12-23-1932, 80 y.o.   MRN: [id number]  Chief Complaint  Patient presents with  . Follow-up    LTFU recheck hernia/discuss sx    HPI [name] is a 80 y.o. male.  Patient sent at request of Dr. Tiburcio PeaHarris for incisional hernia. He has had it for a number of years. It causes intermittent pain . The pain is made worse with physical activity. No nausea or vomiting. Bowels are functioning well. He will go months without any symptoms. Lifting does not seem to make it better or worse. He notices a bulge just to the left of his umbilicus.  He was admitted in September 2014 with a partial small bowel structure. CT is obtained which showed no incisional hernia but significant atrophy and absence of left rectus muscle. He also has gallstones. He was having intermittent pain around his umbilicus last month which is resolved. Denies any right upper quadrant pain, back pain, a food intolerance of fat or chest pain after eating. He's been very active with no significant abdominal wall problem. He is here today to discuss if surgery is necessary for his abdominal wall issues. HPI  Past Medical History  Diagnosis Date  . Angina at rest 02/27/2011  . CAD (coronary artery disease) 02/27/2011    Catheterized on 02/28/11, graft supplying marginal vessel was occluded, after 48 hours of hydration, a repeat cath was performed in an attempt at PCI 03/02/11 cath had an unsuccessful attempt at PCI to an occluded calcified circumflex vessel, Residual distal tip of the ChoICE PT moderate support wire at the subtotal occlusion. No change in the subtotal occlusion,improved retrograde collateralization  . LV dysfunction 02/27/2011  . CKD (chronic kidney disease) stage 3, GFR 30-59 ml/min 02/27/2011  . LBBB (left bundle branch block) 02/27/2011  . Hypertension   . Hyperlipemia 02/27/2011  . Diabetes mellitus 02/27/2011    insulin dependent  . GERD (gastroesophageal reflux disease)   .  CHF (congestive heart failure)   . Blood transfusion     no reaction to transfusion per patient  . Arthritis   . Myocardial infarction   . PVD (peripheral vascular disease)     Carotid Angiography was performed in 2007, no treatment delivered at that time. Right carotid endarterectomy in 2009, being followed by duplex ultrasound. Doppler remains stable.  [name]. CAD (coronary artery disease)     PTCA of the right coronary artery in Feb. 1987, July 1987, and June 1990. Circumflex PTCA in July 1993, followed by a RCA, PTCA in Sept. 1994. CABG in 1994x5 with a LIMA to the LAD, vein graft to the first diagonal, vein graft to the marginal and left circumflex coronary artery, and sequential graft to the PDA and PLA branches of the RCA  . HTN (hypertension)     Renal dopplers have been performed on a semi annual basis. Left renal artery demonstrated vessel narrowing with elevated velocities consistent with a 1-59% diameter reduction.   . carotid doppler     Left ECA : The proximal most aspect of the left external carotid artery demonstrates a peak systolic velocity of 493.9 cm/s. This is consistent with a 70-99% significant stenosis correlated with a 2007 angiogram.    Past Surgical History  Procedure Laterality Date  . Coronary artery bypass graft    . Spinal fusion    . Back surgery      Family History  Problem Relation Age of Onset  . Cancer Mother  unknown to pt  . Cancer Father     pancreas  . Cancer Paternal Grandfather     prostate    Social History History  Substance Use Topics  . Smoking status: Never Smoker   . Smokeless tobacco: Never Used     Comment: quit smoking in the 1960's  . Alcohol Use: No    Allergies  Allergen Reactions  . Lisinopril Cough    Current Outpatient Prescriptions  Medication Sig Dispense Refill  . acetaminophen (TYLENOL) 325 MG tablet Take 650 mg by mouth every 6 (six) hours as needed for pain.      [name] amLODipine (NORVASC) 10 MG tablet Take 10 mg  by mouth daily.        [name] aspirin EC 81 MG tablet Take 81 mg by mouth at bedtime.        . carvedilol (COREG) 25 MG tablet Take 25 mg by mouth 2 (two) times daily with a meal.        . clopidogrel (PLAVIX) 75 MG tablet Take 1 tablet (75 mg total) by mouth daily.  30 tablet  10  . diphenhydrAMINE (BENADRYL) 25 MG tablet Take 25 mg by mouth daily.      [name] FOLIC ACID PO Take 600 mcg by mouth daily.      . furosemide (LASIX) 40 MG tablet Take 40 mg by mouth daily.        [name] glipiZIDE (GLUCOTROL) 10 MG tablet Take 20 mg by mouth 2 (two) times daily.       . Glucosamine-Chondroitin (MOVE FREE PO) Take 1 tablet by mouth 2 (two) times daily.       . hydrALAZINE (APRESOLINE) 25 MG tablet Take 25 mg by mouth 2 (two) times daily.        . isosorbide mononitrate (IMDUR) 60 MG 24 hr tablet Take 60 mg by mouth daily.      [name] loratadine (CLARITIN) 10 MG tablet Take 10 mg by mouth daily.      [name] losartan (COZAAR) 100 MG tablet Take 50 mg by mouth 2 (two) times daily.      . Multiple Vitamins-Minerals (MULTIVITAMINS THER. W/MINERALS) TABS Take 1 tablet by mouth daily.        [name] omeprazole (PRILOSEC) 20 MG capsule Take 20 mg by mouth 2 (two) times daily.       . pravastatin (PRAVACHOL) 40 MG tablet Take 40 mg by mouth at bedtime.         No current facility-administered medications for this visit.    Review of Systems Review of Systems  Constitutional: Negative.   HENT: Negative.   Eyes: Negative.   Respiratory: Negative.   Cardiovascular: Negative.   Gastrointestinal: Positive for abdominal pain.  Musculoskeletal: Negative.   Skin: Negative.   Allergic/Immunologic: Negative.   Neurological: Negative.   Hematological: Negative.   Psychiatric/Behavioral: Negative.     Blood pressure 136/70, pulse 60, temperature 97 F (36.1 C), temperature source Temporal, resp. rate 14, height 5' 11.5" (1.816 m), weight 207 lb 6.4 oz (94.076 kg).  Physical Exam Physical Exam  Constitutional: He is oriented to  person, place, and time. He appears well-developed and well-nourished.  HENT:  Head: Normocephalic and atraumatic.  Eyes: EOM are normal. Pupils are equal, round, and reactive to light.  Neck: Normal range of motion. Neck supple.  Cardiovascular: Normal rate and regular rhythm.   Pulmonary/Chest: Effort normal and breath sounds normal.  Abdominal:  Midline incision noted. Bulging noted left of umbilicus. Soft  nontender. Negative Murphy's sign.  Musculoskeletal: Normal range of motion.  Neurological: He is alert and oriented to person, place, and time.  Skin: Skin is warm and dry.  Psychiatric: He has a normal mood and affect. His behavior is normal. Judgment and thought content normal.      Assessment    Incisional hernia symptomatic reducible Patient Active Problem List   Diagnosis Date Noted  . DM2 (diabetes mellitus, type 2) 12/22/2012  . SBO (small bowel obstruction) 12/22/2012  . PVD - RCE '09 12/04/2012  . Chronic renal insufficiency, stage III (moderate)- follwed by Dr Abel Presto 12/04/2012  . Angina at rest 02/27/2011  . CAD - CABG '94. PCI attempt 2012, Myoview low risk Jan 2013 02/27/2011  . Cardiomyopathy, ischemic- last EF improved to 55-60% 2D 12/23/12 02/27/2011  . LBBB (left bundle branch block) 02/27/2011  . Hyperlipemia 02/27/2011  . HYPERTENSION 12/06/2008  . COUGH 12/06/2008  abdominal wall atrophy by CT scan  Gallstones asymptomatic  History of partial small bowel obstruction  in September 2014        Plan    Long discussion about expectations. After reviewing his CT scan, he has significant atrophy of his left rectus muscle explaining the bulge at the umbilicus. He is aware he is gallstones as well. He is asymptomatic from that. I do not feel surgery at this point in time will make his symptoms any better. It may make him worse since he is not having true  fascial defect.  His discomfort may be related to his activity level. Recommend abdominal binder  and to refrain from heavy lifting. He has multiple medical comorbidities as well. I've given him symptoms and signs of gallbladder disease. Return to clinic as needed.       Vernal Hritz A. 04/13/2013, 12:40 PM

## 2013-04-14 ENCOUNTER — Encounter: Payer: Self-pay | Admitting: *Deleted

## 2013-04-14 ENCOUNTER — Telehealth: Payer: Self-pay | Admitting: *Deleted

## 2013-04-14 DIAGNOSIS — I739 Peripheral vascular disease, unspecified: Secondary | ICD-10-CM

## 2013-04-14 NOTE — Telephone Encounter (Signed)
Message copied by Marella BileVOGEL, Manuel Lawhead W. on Tue Apr 14, 2013 10:01 AM ------      Message from: Runell GessBERRY, JONATHAN J      Created: Sun Apr 12, 2013  4:37 PM       No change from prior study. Repeat in 12 months. ------

## 2013-04-14 NOTE — Telephone Encounter (Signed)
Order placed for repeat carotid and renal dopplers

## 2013-05-29 ENCOUNTER — Ambulatory Visit (INDEPENDENT_AMBULATORY_CARE_PROVIDER_SITE_OTHER): Payer: Medicare Other | Admitting: Cardiovascular Disease

## 2013-05-29 ENCOUNTER — Encounter: Payer: Self-pay | Admitting: Cardiovascular Disease

## 2013-05-29 VITALS — BP 128/72 | HR 54 | Ht 71.0 in | Wt 202.5 lb

## 2013-05-29 DIAGNOSIS — I10 Essential (primary) hypertension: Secondary | ICD-10-CM

## 2013-05-29 DIAGNOSIS — E785 Hyperlipidemia, unspecified: Secondary | ICD-10-CM

## 2013-05-29 DIAGNOSIS — Z79899 Other long term (current) drug therapy: Secondary | ICD-10-CM

## 2013-05-29 DIAGNOSIS — I2510 Atherosclerotic heart disease of native coronary artery without angina pectoris: Secondary | ICD-10-CM

## 2013-05-29 NOTE — Progress Notes (Signed)
05/29/2013 [name]   12-Aug-1932  [id number]  Primary Physician [name], MD Primary Cardiologist: [name] MD [name]   HPI:  The patient is a very pleasant 80 year old, mildly overweight, married Caucasian male, father of 2, grandfather to 1 grandchild who I last saw 6 months ago. He has a history of CAD status post coronary artery bypass grafting in 1994. He also has hypertension, hyperlipidemia, insulin-dependent diabetes and peripheral vascular disease. He had right carotid endarterectomy back in 2009 and has been followed by duplex ultrasound here in our office, most recently July of last year. He has mild renal insufficiency. His creatinines run in the 2 range. He was cath'd by Dr. Tresa Endo March 02, 2011, with a failed attempt at native circumflex recanalization complicated by fractured wire tip. His LIMA to his LAD was patent, as was the vein to a diagonal branch and a vein to the PDA and PLA sequentially. The vein to the marginal branch was occluded. His EF was 35% to 40% with anteroapical hypokinesia. He is totally asymptomatic. His Myoview performed March 30, 2011, showed mild lateral ischemia explainable by his anatomy. He does complain of some abdominal pain which apparently may be related to a hernia which she is seeing a Development worker, international aid for. Recent lab work performed in December revealed total cholesterol 127, LDL of 61 and HDL 38. He saw [name] PAC back in the office 12/09/12. His medicines were adjusted. He had a small bowel obstruction in October was treated with decompression. Apparently at that time his ejection fraction was increased by 2-D echo. He has no cardiovascular symptoms at this time. His lipid profile followed by his PCP.     Current Outpatient Prescriptions  Medication Sig Dispense Refill  . acetaminophen (TYLENOL) 325 MG tablet Take 650 mg by mouth every 6 (six) hours as needed for pain.      [name] amLODipine (NORVASC) 10  MG tablet Take 10 mg by mouth daily.        [name] aspirin EC 81 MG tablet Take 81 mg by mouth at bedtime.        . carvedilol (COREG) 25 MG tablet Take 25 mg by mouth 2 (two) times daily with a meal.        . clopidogrel (PLAVIX) 75 MG tablet Take 1 tablet (75 mg total) by mouth daily.  30 tablet  10  . diphenhydrAMINE (BENADRYL) 25 MG tablet Take 25 mg by mouth daily.      [name] FOLIC ACID PO Take 600 mcg by mouth daily.      . furosemide (LASIX) 40 MG tablet Take 40 mg by mouth daily.        [name] glipiZIDE (GLUCOTROL) 10 MG tablet Take 20 mg by mouth 2 (two) times daily.       . Glucosamine-Chondroitin (MOVE FREE PO) Take 1 tablet by mouth 2 (two) times daily.       . hydrALAZINE (APRESOLINE) 25 MG tablet Take 25 mg by mouth 2 (two) times daily.        . isosorbide mononitrate (IMDUR) 60 MG 24 hr tablet Take 60 mg by mouth daily.      [name] loratadine (CLARITIN) 10 MG tablet Take 10 mg by mouth daily.      [name] losartan (COZAAR) 100 MG tablet Take 50 mg by mouth 2 (two) times daily.      . Multiple Vitamins-Minerals (MULTIVITAMINS THER. W/MINERALS) TABS Take 1 tablet by mouth daily.        [name]  omeprazole (PRILOSEC) 20 MG capsule Take 20 mg by mouth 2 (two) times daily.       . pravastatin (PRAVACHOL) 40 MG tablet Take 40 mg by mouth at bedtime.         No current facility-administered medications for this visit.    Allergies  Allergen Reactions  . Lisinopril Cough    History   Social History  . Marital Status: Married    Spouse Name: N/A    Number of Children: N/A  . Years of Education: N/A   Occupational History  . Not on file.   Social History Main Topics  . Smoking status: Never Smoker   . Smokeless tobacco: Never Used     Comment: quit smoking in the 1960's  . Alcohol Use: No  . Drug Use: No  . Sexual Activity: Yes   Other Topics Concern  . Not on file   Social History Narrative  . No narrative on file     Review of Systems: General: negative for chills, fever, night sweats or  weight changes.  Cardiovascular: negative for chest pain, dyspnea on exertion, edema, orthopnea, palpitations, paroxysmal nocturnal dyspnea or shortness of breath Dermatological: negative for rash Respiratory: negative for cough or wheezing Urologic: negative for hematuria Abdominal: negative for nausea, vomiting, diarrhea, bright red blood per rectum, melena, or hematemesis Neurologic: negative for visual changes, syncope, or dizziness All other systems reviewed and are otherwise negative except as noted above.    Blood pressure 128/72, pulse 54, height 5\' 11"  (1.803 m), weight 91.853 kg (202 lb 8 oz).  General appearance: alert and no distress Neck: no adenopathy, no JVD, supple, symmetrical, trachea midline, thyroid not enlarged, symmetric, no tenderness/mass/nodules and bilateral carotid bruits left greater than right Lungs: clear to auscultation bilaterally Heart: regular rate and rhythm, S1, S2 normal, no murmur, click, rub or gallop Extremities: extremities normal, atraumatic, no cyanosis or edema  EKG sinus bradycardia at 54 bundle-branch block unchanged from last EKG  ASSESSMENT AND PLAN:   CAD - CABG '94. PCI attempt 2012, Myoview low risk Jan 2013 History of CAD status post coronary artery bypass grafting in 1994. He had a complex procedure by Dr. Daphene Jaegerom Kelly 03/02/11 with failed attempt at circumflex recanalization because of a fractured wire tip. His ejection fraction at that time was 35-40%. Apparently read more recently it had come up by 2-D echo. A Myoview stress test performed 03/30/11 that showed mild lateral ischemia explainable by his anatomy. He denies chest pain or shortness of breath.  HYPERTENSION Well-controlled on current medications  Hyperlipemia On statin therapy followed by his PCP      Runell GessJonathan J. Berry MD [facility] - NorthwestFACP,FACC,FAHA, [facility] HospitalFSCAI 05/29/2013 11:16 AM

## 2013-05-29 NOTE — Assessment & Plan Note (Signed)
History of CAD status post coronary artery bypass grafting in 1994. He had a complex procedure by Dr. Daphene Jaegerom Kelly 03/02/11 with failed attempt at circumflex recanalization because of a fractured wire tip. His ejection fraction at that time was 35-40%. Apparently read more recently it had come up by 2-D echo. A Myoview stress test performed 03/30/11 that showed mild lateral ischemia explainable by his anatomy. He denies chest pain or shortness of breath.

## 2013-05-29 NOTE — Assessment & Plan Note (Signed)
On statin therapy followed by his PCP 

## 2013-05-29 NOTE — Assessment & Plan Note (Signed)
Well-controlled on current medications 

## 2013-05-29 NOTE — Patient Instructions (Signed)
Your physician wants you to follow-up in: 6 months with Luke Kilroy PA and 1 year with Dr Berry. You will receive a reminder letter in the mail two months in advance. If you don't receive a letter, please call our office to schedule the follow-up appointment.  

## 2013-06-09 ENCOUNTER — Telehealth: Payer: Self-pay | Admitting: *Deleted

## 2013-06-09 NOTE — Telephone Encounter (Signed)
Dr Allyson SabalBerry reviewed the chart and gave clearance to hold plavix for upcoming colonoscopy. [facility]

## 2013-07-03 ENCOUNTER — Other Ambulatory Visit: Payer: Self-pay | Admitting: Dermatology

## 2013-10-06 ENCOUNTER — Ambulatory Visit (HOSPITAL_COMMUNITY)
Admission: RE | Admit: 2013-10-06 | Discharge: 2013-10-06 | Disposition: A | Discharge status: Home / Self Care | Payer: Medicare Other | Source: Ambulatory Visit | Attending: Cardiovascular Disease | Admitting: Cardiovascular Disease

## 2013-10-06 DIAGNOSIS — I739 Peripheral vascular disease, unspecified: Secondary | ICD-10-CM | POA: Diagnosis present

## 2013-10-06 DIAGNOSIS — I10 Essential (primary) hypertension: Secondary | ICD-10-CM

## 2013-10-06 DIAGNOSIS — I701 Atherosclerosis of renal artery: Secondary | ICD-10-CM

## 2013-10-06 NOTE — Progress Notes (Signed)
Renal Duplex Completed. Carlye Panameno, BS, RDMS, RVT  

## 2013-10-16 ENCOUNTER — Encounter: Payer: Self-pay | Admitting: *Deleted

## 2013-11-12 ENCOUNTER — Ambulatory Visit: Payer: Medicare Other | Admitting: Cardiovascular Disease

## 2013-11-13 ENCOUNTER — Other Ambulatory Visit: Payer: Self-pay | Admitting: *Deleted

## 2013-11-13 ENCOUNTER — Encounter: Payer: Self-pay | Admitting: Cardiovascular Disease

## 2013-11-13 ENCOUNTER — Ambulatory Visit (INDEPENDENT_AMBULATORY_CARE_PROVIDER_SITE_OTHER): Payer: Medicare Other | Admitting: Cardiovascular Disease

## 2013-11-13 VITALS — BP 180/68 | HR 56 | Ht 71.0 in | Wt 195.0 lb

## 2013-11-13 DIAGNOSIS — E785 Hyperlipidemia, unspecified: Secondary | ICD-10-CM

## 2013-11-13 DIAGNOSIS — I2510 Atherosclerotic heart disease of native coronary artery without angina pectoris: Secondary | ICD-10-CM

## 2013-11-13 DIAGNOSIS — I739 Peripheral vascular disease, unspecified: Secondary | ICD-10-CM

## 2013-11-13 DIAGNOSIS — E875 Hyperkalemia: Secondary | ICD-10-CM

## 2013-11-13 DIAGNOSIS — R55 Syncope and collapse: Secondary | ICD-10-CM

## 2013-11-13 DIAGNOSIS — I10 Essential (primary) hypertension: Secondary | ICD-10-CM

## 2013-11-13 LAB — BASIC METABOLIC PANEL
BUN: 55 mg/dL — ABNORMAL HIGH (ref 6–23)
CO2: 24 mEq/L (ref 19–32)
CREATININE: 1.76 mg/dL — AB (ref 0.50–1.35)
Calcium: 9 mg/dL (ref 8.4–10.5)
Chloride: 107 mEq/L (ref 96–112)
GLUCOSE: 127 mg/dL — AB (ref 70–99)
Potassium: 5.8 mEq/L — ABNORMAL HIGH (ref 3.5–5.3)
Sodium: 139 mEq/L (ref 135–145)

## 2013-11-13 MED ORDER — HYDRALAZINE HCL 25 MG PO TABS: 25.0000 mg | ORAL_TABLET | Freq: Three times a day (TID) | ORAL | Status: DC

## 2013-11-13 NOTE — Assessment & Plan Note (Signed)
Ordinarily well-controlled on his current medications although he did not take his medications this morning and blood pressure is elevated. He did have blood work performed at [facility] at that revealed a serum potassium of 5.8. He is on losartan 100 mg daily. I am going to recheck a basic metabolic panel/serum potassium level.

## 2013-11-13 NOTE — Assessment & Plan Note (Signed)
History of right carotid endarterectomy in 2009 which we followed by duplex ultrasound.

## 2013-11-13 NOTE — Assessment & Plan Note (Signed)
History of CAD status post remote coronary artery bypass grafting in 1994. He had failed intervention by Dr. Tresa EndoKelly in 2012 with attempt at circumflex recanalization and fractured wire tip. His last ejection fraction was normal. He denies chest pain or shortness of breath.

## 2013-11-13 NOTE — Patient Instructions (Signed)
  We will see you back in follow up in 2 months with Corine ShelterLuke Kilroy [facility] and 6 months with Dr Allyson SabalBerry.   Dr Allyson SabalBerry has ordered: 1. Blood work to be done on the way out today  2.  Event monitor (30 days). Event monitors are medical devices that record the heart's electrical activity. Doctors most often us these monitors to diagnose arrhythmias. Arrhythmias are problems with the speed or rhythm of the heartbeat. The monitor is a small, portable device. You can wear one while you do your normal daily activities. This is usually used to diagnose what is causing palpitations/syncope (passing out).

## 2013-11-13 NOTE — Assessment & Plan Note (Signed)
On statin therapy with recent lipid profile performed by the [facility] 11/09/13 revealing a total cholesterol 108, LDL 26 and HDL of 37

## 2013-11-13 NOTE — Progress Notes (Signed)
11/13/2013 [name]   12/11/1932  [id number]  Primary Physician [name], MD Primary Cardiologist: [name] MD [name]   HPI:  The patient is a very pleasant 80 year old, mildly overweight, married Caucasian male, father of 2, grandfather to 1 grandchild who I last saw 6 months ago. He has a history of CAD status post coronary artery bypass grafting in 1994. He also has hypertension, hyperlipidemia, insulin-dependent diabetes and peripheral vascular disease. He had right carotid endarterectomy back in 2009 and has been followed by duplex ultrasound here in our office, most recently July of last year. He has mild renal insufficiency. His creatinines run in the 2 range. He was cath'd by Dr. Tresa Endo March 02, 2011, with a failed attempt at native circumflex recanalization complicated by fractured wire tip. His LIMA to his LAD was patent, as was the vein to a diagonal branch and a vein to the PDA and PLA sequentially. The vein to the marginal branch was occluded. His EF was 35% to 40% with anteroapical hypokinesia. He is totally asymptomatic. His Myoview performed March 30, 2011, showed mild lateral ischemia explainable by his anatomy. He does complain of some abdominal pain which apparently may be related to a hernia which she is seeing a Development worker, international aid for. Recent lab work performed in December revealed total cholesterol 127, LDL of 61 and HDL 38. He saw [name] PAC back in the office 12/09/12. His medicines were adjusted. He had a small bowel obstruction in October was treated with decompression. Apparently at that time his ejection fraction was increased by 2-D echo.  I saw him 6 months ago. Over the last several months she's had episodes that sound hypoglycemic with disorientation and diaphoresis. He now has a blood glucose monitor at home which he is going to check. I'm going to order a limited amount her to rule out an arrhythmogenic  cause.     Current Outpatient Prescriptions  Medication Sig Dispense Refill  . acetaminophen (TYLENOL) 325 MG tablet Take 650 mg by mouth every 6 (six) hours as needed for pain.      [name] amLODipine (NORVASC) 10 MG tablet Take 10 mg by mouth daily.        [name] aspirin EC 81 MG tablet Take 81 mg by mouth at bedtime.        . carvedilol (COREG) 25 MG tablet Take 25 mg by mouth 2 (two) times daily with a meal.        . clopidogrel (PLAVIX) 75 MG tablet Take 1 tablet (75 mg total) by mouth daily.  30 tablet  10  . diphenhydrAMINE (BENADRYL) 25 MG tablet Take 25 mg by mouth daily.      [name] FOLIC ACID PO Take 600 mcg by mouth daily.      . furosemide (LASIX) 40 MG tablet Take 40 mg by mouth daily.        [name] glipiZIDE (GLUCOTROL) 10 MG tablet Take 20 mg by mouth 2 (two) times daily.       . Glucosamine-Chondroitin (MOVE FREE PO) Take 1 tablet by mouth 2 (two) times daily.       . hydrALAZINE (APRESOLINE) 25 MG tablet Take 25 mg by mouth 2 (two) times daily.        . isosorbide mononitrate (IMDUR) 60 MG 24 hr tablet Take 60 mg by mouth daily.      [name] loratadine (CLARITIN) 10 MG tablet Take 10 mg by mouth daily.      [name]  losartan (COZAAR) 100 MG tablet Take 50 mg by mouth daily.       . Multiple Vitamins-Minerals (MULTIVITAMINS THER. W/MINERALS) TABS Take 1 tablet by mouth daily.        [name]. omeprazole (PRILOSEC) 20 MG capsule Take 20 mg by mouth 2 (two) times daily.       . pravastatin (PRAVACHOL) 40 MG tablet Take 40 mg by mouth at bedtime.         No current facility-administered medications for this visit.    Allergies  Allergen Reactions  . Lisinopril Cough    History   Social History  . Marital Status: Married    Spouse Name: N/A    Number of Children: N/A  . Years of Education: N/A   Occupational History  . Not on file.   Social History Main Topics  . Smoking status: Never Smoker   . Smokeless tobacco: Never Used     Comment: quit smoking in the 1960's  . Alcohol Use: No  . Drug Use:  No  . Sexual Activity: Yes   Other Topics Concern  . Not on file   Social History Narrative  . No narrative on file     Review of Systems: General: negative for chills, fever, night sweats or weight changes.  Cardiovascular: negative for chest pain, dyspnea on exertion, edema, orthopnea, palpitations, paroxysmal nocturnal dyspnea or shortness of breath Dermatological: negative for rash Respiratory: negative for cough or wheezing Urologic: negative for hematuria Abdominal: negative for nausea, vomiting, diarrhea, bright red blood per rectum, melena, or hematemesis Neurologic: negative for visual changes, syncope, or dizziness All other systems reviewed and are otherwise negative except as noted above.    Blood pressure 180/68, pulse 56, height 5\' 11"  (1.803 m), weight 195 lb (88.451 kg).  General appearance: alert and no distress Neck: no adenopathy, no carotid bruit, no JVD, supple, symmetrical, trachea midline and thyroid not enlarged, symmetric, no tenderness/mass/nodules Lungs: clear to auscultation bilaterally Heart: regular rate and rhythm, S1, S2 normal, no murmur, click, rub or gallop Extremities: extremities normal, atraumatic, no cyanosis or edema  EKG decisiveness 61 with lipoma branch block unchanged from prior studies  ASSESSMENT AND PLAN:   PVD - RCE '09 History of right carotid endarterectomy in 2009 which we followed by duplex ultrasound.  Hyperlipemia On statin therapy with [facility] revealing a total cholesterol 108, LDL 26 and HDL of 37  CAD - CABG '94. PCI attempt 2012, Myoview low risk Jan 2013 History of CAD status post remote coronary artery bypass grafting in 1994. He had failed intervention by Dr. Tresa EndoKelly in 2012 with attempt at circumflex recanalization and fractured wire tip. His last ejection fraction was normal. He denies chest pain or shortness of breath.  HYPERTENSION Ordinarily  well-controlled on his current medications although he did not take his medications this morning and blood pressure is elevated. He did have [facility] at that revealed a serum potassium of 5.8. He is on losartan 100 mg daily. I am going to recheck a basic metabolic panel/serum potassium level.      Runell GessJonathan J. Masashi Snowdon MD FACP,FACC,FAHA, [facility] 11/13/2013 9:25 AM

## 2013-11-18 ENCOUNTER — Ambulatory Visit (INDEPENDENT_AMBULATORY_CARE_PROVIDER_SITE_OTHER): Payer: Medicare Other | Admitting: Pharmacist Clinician (PhC)/ Clinical Pharmacy Specialist

## 2013-11-18 VITALS — BP 162/62 | HR 56 | Ht 71.0 in | Wt 196.8 lb

## 2013-11-18 DIAGNOSIS — I10 Essential (primary) hypertension: Secondary | ICD-10-CM

## 2013-11-18 DIAGNOSIS — I2510 Atherosclerotic heart disease of native coronary artery without angina pectoris: Secondary | ICD-10-CM

## 2013-11-18 LAB — BASIC METABOLIC PANEL
BUN: 44 mg/dL — AB (ref 6–23)
CALCIUM: 8.9 mg/dL (ref 8.4–10.5)
CO2: 21 mEq/L (ref 19–32)
Chloride: 107 mEq/L (ref 96–112)
Creat: 1.74 mg/dL — ABNORMAL HIGH (ref 0.50–1.35)
Glucose, Bld: 251 mg/dL — ABNORMAL HIGH (ref 70–99)
POTASSIUM: 5.2 meq/L (ref 3.5–5.3)
SODIUM: 138 meq/L (ref 135–145)

## 2013-11-18 NOTE — Patient Instructions (Signed)
Return for a a follow up appointment in 1 month  Your blood pressure today is 162/62  (gaol <140/90)  Check your blood pressure at home daily (if able) and keep record of the readings.  Take your BP meds as follows - continue with amlodipine  each morning, carvedilol twice daily.  CHANGE HYDRALAZINE TO 2 TABLETS TWICE DAILY  Bring all of your meds, your BP cuff and your record of home blood pressures to your next appointment.  Exercise as you're able, try to walk approximately 30 minutes per day.  Keep salt intake to a minimum, especially watch canned and prepared boxed foods.  Eat more fresh fruits and vegetables and fewer canned items.  Avoid eating in fast food restaurants.    HOW TO TAKE YOUR BLOOD PRESSURE:   Rest 5 minutes before taking your blood pressure.    Don't smoke or drink caffeinated beverages for at least 30 minutes before.   Take your blood pressure before (not after) you eat.   Sit comfortably with your back supported and both feet on the floor (don't cross your legs).   Elevate your arm to heart level on a table or a desk.   Use the proper sized cuff. It should fit smoothly and snugly around your bare upper arm. There should be enough room to slip a fingertip under the cuff. The bottom edge of the cuff should be 1 inch above the crease of the elbow.   Ideally, take 3 measurements at one sitting and record the average.

## 2013-11-20 ENCOUNTER — Encounter: Payer: Self-pay | Admitting: Pharmacist Clinician (PhC)/ Clinical Pharmacy Specialist

## 2013-11-20 MED ORDER — HYDRALAZINE HCL 50 MG PO TABS: 50.0000 mg | ORAL_TABLET | Freq: Two times a day (BID) | ORAL | Status: DC

## 2013-11-20 NOTE — Assessment & Plan Note (Addendum)
Today [name] blood pressure is improved but still elevated.  Because he is diabetic we will aim for a goal of <140/90.  Today we will adjust his hydralazine, having him take  twice daily.  Hopefully this will not only bring his pressure down to goal, but increase compliance by removing the mid-day dose.  He is to continue with AM dose of amlodipine  and twice daily carvedilol.  He will also have a repeat BMET today, due to high potassium levels.  I will see him back in one month for follow up.

## 2013-11-20 NOTE — Progress Notes (Signed)
11/20/2013 PEGGY MONK 1932/05/21 [id number]   HPI:  [name] is a 80 y.o. male patient of Dr Allyson Sabal, who presents today for a blood pressure check.   His past cardiac history is significant for CAD with CABG in 1994 and right carotid endarterectomy in 2009.  No significant family history of heart disease.  He is a former smoker having quit at age 62 and does not drink alcohol.  He does drink 2-3 cups of coffee per day and eats in fast food restaurants several times per week.  Patient states that all other meals are home cooked and his wife is very cautious to make them low sodium.  He is unable to exercise regularly due to 3 previous back surgeries, leaving him uncomfortable walking any distances.  He does try to stay active, however and works in his garden regularly during the summer months.    When he saw Dr. Allyson Sabal last week his BP was elevated at 180/68 and his potassium at 5.8.  Dr. Allyson Sabal switched him off the losartan  and increased his hydralazine from twice daily to three times daily.  Patient admits difficulty in remembering the mid-day dose of hydralazine, and has missed more than one dose already.     Current Outpatient Prescriptions  Medication Sig Dispense Refill  . acetaminophen (TYLENOL) 325 MG tablet Take 650 mg by mouth every 6 (six) hours as needed for pain.      [name] amLODipine (NORVASC) 10 MG tablet Take 10 mg by mouth daily.        [name] aspirin EC 81 MG tablet Take 81 mg by mouth at bedtime.        . carvedilol (COREG) 25 MG tablet Take 25 mg by mouth 2 (two) times daily with a meal.        . clopidogrel (PLAVIX) 75 MG tablet Take 1 tablet (75 mg total) by mouth daily.  30 tablet  10  . diphenhydrAMINE (BENADRYL) 25 MG tablet Take 25 mg by mouth daily.      [name] FOLIC ACID PO Take 600 mcg by mouth daily.      . furosemide (LASIX) 40 MG tablet Take 40 mg by mouth daily.        [name] glipiZIDE (GLUCOTROL) 10 MG tablet Take 20 mg by mouth 2 (two) times daily.         . Glucosamine-Chondroitin (MOVE FREE PO) Take 1 tablet by mouth 2 (two) times daily.       . hydrALAZINE (APRESOLINE) 25 MG tablet Take 1 tablet (25 mg total) by mouth 3 (three) times daily.  90 tablet  6  . isosorbide mononitrate (IMDUR) 60 MG 24 hr tablet Take 60 mg by mouth daily.      [name] loratadine (CLARITIN) 10 MG tablet Take 10 mg by mouth daily.      . Multiple Vitamins-Minerals (MULTIVITAMINS THER. W/MINERALS) TABS Take 1 tablet by mouth daily.        [name] omeprazole (PRILOSEC) 20 MG capsule Take 20 mg by mouth 2 (two) times daily.       . pravastatin (PRAVACHOL) 40 MG tablet Take 40 mg by mouth at bedtime.         No current facility-administered medications for this visit.    Allergies  Allergen Reactions  . Lisinopril Cough    Past Medical History  Diagnosis Date  . Angina at rest 02/27/2011  . CAD (coronary artery disease) 02/27/2011    Catheterized on 02/28/11,  graft supplying marginal vessel was occluded, after 48 hours of hydration, a repeat cath was performed in an attempt at PCI 03/02/11 cath had an unsuccessful attempt at PCI to an occluded calcified circumflex vessel, Residual distal tip of the [employer] PT moderate support wire at the subtotal occlusion. No change in the subtotal occlusion,improved retrograde collateralization  . LV dysfunction 02/27/2011  . CKD (chronic kidney disease) stage 3, GFR 30-59 ml/min 02/27/2011  . LBBB (left bundle branch block) 02/27/2011  . Hypertension   . Hyperlipemia 02/27/2011  . Diabetes mellitus 02/27/2011    insulin dependent  . GERD (gastroesophageal reflux disease)   . CHF (congestive heart failure)   . Blood transfusion     no reaction to transfusion per patient  . Arthritis   . Myocardial infarction   . PVD (peripheral vascular disease)     Carotid Angiography was performed in 2007, no treatment delivered at that time. Right carotid endarterectomy in 2009, being followed by duplex ultrasound. Doppler remains stable.  [name] CAD (coronary  artery disease)     PTCA of the right coronary artery in Feb. 1987, July 1987, and June 1990. Circumflex PTCA in July 1993, followed by a RCA, PTCA in Sept. 1994. CABG in 1994x5 with a LIMA to the LAD, vein graft to the first diagonal, vein graft to the marginal and left circumflex coronary artery, and sequential graft to the PDA and PLA branches of the RCA  . HTN (hypertension)     Renal dopplers have been performed on a semi annual basis. Left renal artery demonstrated vessel narrowing with elevated velocities consistent with a 1-59% diameter reduction.   . carotid doppler     Left ECA : The proximal most aspect of the left external carotid artery demonstrates a peak systolic velocity of 493.9 cm/s. This is consistent with a 70-99% significant stenosis correlated with a 2007 angiogram.    Blood pressure 162/62, pulse 56, height  (1.803 m), weight 196 lb 12.8 oz (89.268 kg).   ASSESSMENT AND PLAN:  Phillips Hay PharmD CPP [facility]

## 2013-12-08 ENCOUNTER — Ambulatory Visit: Payer: Medicare Other | Admitting: Cardiology

## 2013-12-15 ENCOUNTER — Ambulatory Visit: Payer: Medicare Other | Admitting: Cardiology

## 2013-12-21 ENCOUNTER — Encounter: Payer: Self-pay | Admitting: Pharmacist Clinician (PhC)/ Clinical Pharmacy Specialist

## 2013-12-21 ENCOUNTER — Ambulatory Visit (INDEPENDENT_AMBULATORY_CARE_PROVIDER_SITE_OTHER): Payer: Medicare Other | Admitting: Pharmacist Clinician (PhC)/ Clinical Pharmacy Specialist

## 2013-12-21 VITALS — BP 112/60 | HR 60 | Ht 71.0 in | Wt 200.6 lb

## 2013-12-21 DIAGNOSIS — I10 Essential (primary) hypertension: Secondary | ICD-10-CM

## 2013-12-21 DIAGNOSIS — I2510 Atherosclerotic heart disease of native coronary artery without angina pectoris: Secondary | ICD-10-CM

## 2013-12-21 NOTE — Patient Instructions (Signed)
Return for a a follow up appointment in October with [name]  Your blood pressure today is 112/60  Check your blood pressure at home several times each week and keep record of the readings.  Be sure to hold wrist monitor to chest when using  Take your BP meds as follows - no changes to medication  Exercise as you're able, try to walk approximately 30 minutes per day.  Keep salt intake to a minimum, especially watch canned and prepared boxed foods.  Eat more fresh fruits and vegetables and fewer canned items.  Avoid eating in fast food restaurants.   HOW TO TAKE YOUR BLOOD PRESSURE:   Rest 5 minutes before taking your blood pressure.    Don't smoke or drink caffeinated beverages for at least 30 minutes before.   Take your blood pressure before (not after) you eat.   Sit comfortably with your back supported and both feet on the floor (don't cross your legs).   Elevate your arm to heart level on a table or a desk.   Use the proper sized cuff. It should fit smoothly and snugly around your bare upper arm. There should be enough room to slip a fingertip under the cuff. The bottom edge of the cuff should be 1 inch above the crease of the elbow.   Ideally, take 3 measurements at one sitting and record the average.

## 2013-12-21 NOTE — Assessment & Plan Note (Signed)
Today Mr. Dolin blood pressure is excellent at 112/60.  His home arm cuff read at 137/61 and the wrist cuff at 111/60.  I advised that the arm cuff was no longer accurate and he should stop using it.  With his wrist cuff, he found a difference of about 20 points systolic when held to chest versus holding his arm out.  The reading against his chest was most accurate.  I have encouraged him to use it this way in the future.   Because his readings look good today, I will not make any changes to his medication.  He reports no dizziness or lightheadedness with positional changes.  He is due for follow up with [name] in October.  I will see him again if needed in the future.

## 2013-12-21 NOTE — Progress Notes (Signed)
12/21/2013 [name] 04/24/1932 [id number]   HPI:  [name] is a 80 y.o. male patient of Dr Allyson Sabal, who presents today for a blood pressure check.   His past cardiac history is significant for CAD with CABG in 1994 and right carotid endarterectomy in 2009.  No significant family history of heart disease.  He is a former smoker having quit at age 76 and does not drink alcohol.  He does drink 2-3 cups of coffee per day and eats in fast food restaurants several times per week.  Patient states that all other meals are home cooked and his wife is very cautious to make them low sodium.  He is unable to exercise regularly due to 3 previous back surgeries, leaving him uncomfortable walking any distances.  He does try to stay active, however and works in his garden regularly during the summer months, and is looking forward to deer hunting season in November.    When I saw him last his pressure was still elevated at 162/62.  He had recently increased his hydralazine from  twice daily to three times daily.  He did admit to missing mid-day doses several times per week.  Because of the compliance problem and still elevated pressure, I changed his dose back to twice daily, but increased to  bid.  His potassium level had been elevated at 5.8, but at the last BMET was back down to 5.2, off of losartan.    Today he states all is fine.  His home BP readings have all been below the 150/90 threshold, with the exception of 3 readings.  Most were in the 130-140 systolic range.  He has 2 blood pressure cuffs, which he brings in today.  The first is an older [employer] from [place], at least 66-78 years old.  The second is a newer wrist cuff, purchased within the past several months.     Current Outpatient Prescriptions  Medication Sig Dispense Refill  . acetaminophen (TYLENOL) 325 MG tablet Take 650 mg by mouth every 6 (six) hours as needed for pain.      [name] amLODipine (NORVASC) 10 MG tablet Take  10 mg by mouth daily.        [name] aspirin EC 81 MG tablet Take 81 mg by mouth at bedtime.        . carvedilol (COREG) 25 MG tablet Take 25 mg by mouth 2 (two) times daily with a meal.        . clopidogrel (PLAVIX) 75 MG tablet Take 1 tablet (75 mg total) by mouth daily.  30 tablet  10  . diphenhydrAMINE (BENADRYL) 25 MG tablet Take 25 mg by mouth daily.      [name] FOLIC ACID PO Take 600 mcg by mouth daily.      . furosemide (LASIX) 40 MG tablet Take 40 mg by mouth daily.        [name] glipiZIDE (GLUCOTROL) 10 MG tablet Take 20 mg by mouth 2 (two) times daily.       . Glucosamine-Chondroitin (MOVE FREE PO) Take 1 tablet by mouth 2 (two) times daily.       . hydrALAZINE (APRESOLINE) 50 MG tablet Take 1 tablet (50 mg total) by mouth 2 (two) times daily.  180 tablet  1  . isosorbide mononitrate (IMDUR) 60 MG 24 hr tablet Take 60 mg by mouth daily.      [name] loratadine (CLARITIN) 10 MG tablet Take 10 mg by mouth daily.      [name]  Multiple Vitamins-Minerals (MULTIVITAMINS THER. W/MINERALS) TABS Take 1 tablet by mouth daily.        [name] omeprazole (PRILOSEC) 20 MG capsule Take 20 mg by mouth 2 (two) times daily.       . pravastatin (PRAVACHOL) 40 MG tablet Take 40 mg by mouth at bedtime.         No current facility-administered medications for this visit.    Allergies  Allergen Reactions  . Lisinopril Cough    Past Medical History  Diagnosis Date  . Angina at rest 02/27/2011  . CAD (coronary artery disease) 02/27/2011    Catheterized on 02/28/11, graft supplying marginal vessel was occluded, after 48 hours of hydration, a repeat cath was performed in an attempt at PCI 03/02/11 cath had an unsuccessful attempt at PCI to an occluded calcified circumflex vessel, Residual distal tip of the ChoICE PT moderate support wire at the subtotal occlusion. No change in the subtotal occlusion,improved retrograde collateralization  . LV dysfunction 02/27/2011  . CKD (chronic kidney disease) stage 3, GFR 30-59 ml/min 02/27/2011  . LBBB  (left bundle branch block) 02/27/2011  . Hypertension   . Hyperlipemia 02/27/2011  . Diabetes mellitus 02/27/2011    insulin dependent  . GERD (gastroesophageal reflux disease)   . CHF (congestive heart failure)   . Blood transfusion     no reaction to transfusion per patient  . Arthritis   . Myocardial infarction   . PVD (peripheral vascular disease)     Carotid Angiography was performed in 2007, no treatment delivered at that time. Right carotid endarterectomy in 2009, being followed by duplex ultrasound. Doppler remains stable.  [name] CAD (coronary artery disease)     PTCA of the right coronary artery in Feb. 1987, July 1987, and June 1990. Circumflex PTCA in July 1993, followed by a RCA, PTCA in Sept. 1994. CABG in 1994x5 with a LIMA to the LAD, vein graft to the first diagonal, vein graft to the marginal and left circumflex coronary artery, and sequential graft to the PDA and PLA branches of the RCA  . HTN (hypertension)     Renal dopplers have been performed on a semi annual basis. Left renal artery demonstrated vessel narrowing with elevated velocities consistent with a 1-59% diameter reduction.   . carotid doppler     Left ECA : The proximal most aspect of the left external carotid artery demonstrates a peak systolic velocity of 493.9 cm/s. This is consistent with a 70-99% significant stenosis correlated with a 2007 angiogram.    Blood pressure 112/60, pulse 60, height  (1.803 m), weight 200 lb 9.6 oz (90.992 kg).   ASSESSMENT AND PLAN:  Phillips Hay PharmD CPP [facility]

## 2014-01-13 ENCOUNTER — Encounter: Payer: Self-pay | Admitting: Cardiology

## 2014-01-13 ENCOUNTER — Ambulatory Visit (INDEPENDENT_AMBULATORY_CARE_PROVIDER_SITE_OTHER): Payer: Medicare Other | Admitting: Cardiology

## 2014-01-13 VITALS — BP 131/63 | HR 66 | Ht 71.0 in | Wt 196.9 lb

## 2014-01-13 DIAGNOSIS — E1122 Type 2 diabetes mellitus with diabetic chronic kidney disease: Secondary | ICD-10-CM

## 2014-01-13 DIAGNOSIS — N183 Chronic kidney disease, stage 3 unspecified: Secondary | ICD-10-CM

## 2014-01-13 DIAGNOSIS — E785 Hyperlipidemia, unspecified: Secondary | ICD-10-CM

## 2014-01-13 DIAGNOSIS — I447 Left bundle-branch block, unspecified: Secondary | ICD-10-CM

## 2014-01-13 DIAGNOSIS — I739 Peripheral vascular disease, unspecified: Secondary | ICD-10-CM

## 2014-01-13 DIAGNOSIS — N189 Chronic kidney disease, unspecified: Secondary | ICD-10-CM

## 2014-01-13 DIAGNOSIS — I2510 Atherosclerotic heart disease of native coronary artery without angina pectoris: Secondary | ICD-10-CM

## 2014-01-13 DIAGNOSIS — I10 Essential (primary) hypertension: Secondary | ICD-10-CM

## 2014-01-13 DIAGNOSIS — E875 Hyperkalemia: Secondary | ICD-10-CM

## 2014-01-13 DIAGNOSIS — I255 Ischemic cardiomyopathy: Secondary | ICD-10-CM

## 2014-01-13 NOTE — Assessment & Plan Note (Signed)
Bilateral carotid bruits, we follow his dopplers.

## 2014-01-13 NOTE — Assessment & Plan Note (Signed)
Taken off ARB and intolerant to ACE

## 2014-01-13 NOTE — Assessment & Plan Note (Signed)
Last SCr 1.74, Aug 2015

## 2014-01-13 NOTE — Assessment & Plan Note (Signed)
[facility] last cholesterol level was "good".

## 2014-01-13 NOTE — Assessment & Plan Note (Signed)
No CHF symptoms 

## 2014-01-13 NOTE — Assessment & Plan Note (Signed)
NIDDM 

## 2014-01-13 NOTE — Patient Instructions (Signed)
Your physician wants you to follow-up in: 6 months or sooner with Dr. Allyson SabalBerry. You will receive a reminder letter in the mail two months in advance. If you don't receive a letter, please call our office to schedule the follow-up appointment. No changes were made today in your therapy.

## 2014-01-13 NOTE — Progress Notes (Signed)
01/13/2014 [name]   [birth year]  [id number]  Primary Physicia Johny BlamerHARRIS, WILLIAM, MD Primary Cardiologist: Dr Allyson SabalBerry  HPI:  The patient is a 80 y/o male followed by Dr Allyson SabalBerry with a history of CAD. He had CABG in '94. In December 2012 he had angina and underwent an attempted PCI to the CFX but the tip of the catheter broke off and the procedure was aborted. Fortunately he has done well since. He had a Myoview Jan 2013 that was low risk. His EF at that time was 42%. He was admitted in Oct 2014 with a SBO that was treated conservatively. He had an echo then that showed his EF to be 55%. Recently he had his medications adjusted by Pharmacist Phillips HayKristin Alvstad. His ARB had been stopped secondary to hyperkalemia. She ultimatley was able to achieve good control with adjustment of his Hydralazine. Dr Allyson SabalBerry had seen him in Aug this year and ordered an event monitor for episodic dizziness. This revealed frequent PACs and short, 3-5 beat runs, of PAT. The pt now believes his symptoms were from low blood sugar and he has had no further problems. He denies chest pain or dyspnea.     Current Outpatient Prescriptions  Medication Sig Dispense Refill  . acetaminophen (TYLENOL) 325 MG tablet Take 650 mg by mouth every 6 (six) hours as needed for pain.      [name]. amLODipine (NORVASC) 10 MG tablet Take 10 mg by mouth daily.        [name]. aspirin EC 81 MG tablet Take 81 mg by mouth at bedtime.        . carvedilol (COREG) 25 MG tablet Take 25 mg by mouth 2 (two) times daily with a meal.        . clopidogrel (PLAVIX) 75 MG tablet Take 1 tablet (75 mg total) by mouth daily.  30 tablet  10  . diphenhydrAMINE (BENADRYL) 25 MG tablet Take 25 mg by mouth daily.      [name]. FOLIC ACID PO Take 600 mcg by mouth daily.      . furosemide (LASIX) 40 MG tablet Take 40 mg by mouth daily.        [name]. glipiZIDE (GLUCOTROL) 10 MG tablet Take 20 mg by mouth 2 (two) times daily.       . Glucosamine-Chondroitin (MOVE FREE PO) Take 1 tablet  by mouth 2 (two) times daily.       . hydrALAZINE (APRESOLINE) 50 MG tablet Take 1 tablet (50 mg total) by mouth 2 (two) times daily.  180 tablet  1  . isosorbide mononitrate (IMDUR) 60 MG 24 hr tablet Take 60 mg by mouth daily.      [name]. loratadine (CLARITIN) 10 MG tablet Take 10 mg by mouth daily.      . Multiple Vitamins-Minerals (MULTIVITAMINS THER. W/MINERALS) TABS Take 1 tablet by mouth daily.        [name]. omeprazole (PRILOSEC) 20 MG capsule Take 20 mg by mouth 2 (two) times daily.       . pravastatin (PRAVACHOL) 40 MG tablet Take 40 mg by mouth at bedtime.         No current facility-administered medications for this visit.    Allergies  Allergen Reactions  . Lisinopril Cough    History   Social History  . Marital Status: Married    Spouse Name: N/A    Number of Children: N/A  . Years of Education: N/A   Occupational History  . Not on file.  Social History Main Topics  . Smoking status: Never Smoker   . Smokeless tobacco: Never Used     Comment: quit smoking in the 1960's  . Alcohol Use: No  . Drug Use: No  . Sexual Activity: Yes   Other Topics Concern  . Not on file   Social History Narrative  . No narrative on file     Review of Systems: HOH General: negative for chills, fever, night sweats or weight changes.  Cardiovascular: negative for chest pain, dyspnea on exertion, edema, orthopnea, palpitations, paroxysmal nocturnal dyspnea or shortness of breath Dermatological: negative for rash Respiratory: negative for cough or wheezing Urologic: negative for hematuria Abdominal: negative for nausea, vomiting, diarrhea, bright red blood per rectum, melena, or hematemesis Neurologic: negative for visual changes, syncope, or dizziness All other systems reviewed and are otherwise negative except as noted above.    Blood pressure 131/63, pulse 66, height 5\' 11"  (1.803 m), weight 196 lb 14.4 oz (89.313 kg).  General appearance: alert, cooperative, no distress and  hearing aid Rt ear Neck: no JVD and bilateral bruits, Rt > Lt Lungs: clear to auscultation bilaterally Heart: regular rate and rhythm   ASSESSMENT AND PLAN:   CAD - CABG '94. PCI attempt 2012, Myoview low risk Jan 2013 No angina  Cardiomyopathy, ischemic- last EF improved to 55-60% 2D 12/23/12 No CHF symptoms  Chronic renal insufficiency, stage III (moderate)- follwed by Dr Abel Prestoolodonato Last SCr 1.74, Aug 2015  DM2 (diabetes mellitus, type 2) NIDDM  LBBB (left bundle branch block) .  Essential hypertension Recent medication adjustments made resulting in good control  [facility], his last cholesterol level was "good".  PVD - RCE '09 Bilateral carotid bruits, we follow his dopplers.   Hyperkalemia Taken off ARB and intolerant to ACE   PLAN  Same Rx. He will follow up with Dr Allyson SabalBerry in 6 months. I asked them to bring a copy of his lipids to his next OV.   [name] KPA-C 01/13/2014 12:09 PM

## 2014-01-13 NOTE — Assessment & Plan Note (Signed)
Recent medication adjustments made resulting in good control

## 2014-01-13 NOTE — Assessment & Plan Note (Signed)
No angina 

## 2014-01-26 ENCOUNTER — Other Ambulatory Visit: Payer: Self-pay | Admitting: Family Medicine

## 2014-01-26 DIAGNOSIS — M545 Low back pain: Secondary | ICD-10-CM

## 2014-02-12 ENCOUNTER — Telehealth: Payer: Self-pay | Admitting: Cardiovascular Disease

## 2014-02-16 NOTE — Telephone Encounter (Signed)
Closed encounter °

## 2014-03-03 ENCOUNTER — Encounter (HOSPITAL_COMMUNITY): Payer: Self-pay | Admitting: Cardiovascular Disease

## 2014-03-04 ENCOUNTER — Encounter (HOSPITAL_COMMUNITY): Payer: Self-pay | Admitting: Cardiovascular Disease

## 2014-03-05 ENCOUNTER — Telehealth: Payer: Self-pay | Admitting: Cardiovascular Disease

## 2014-03-05 NOTE — Telephone Encounter (Signed)
Message sent to scheduler. 

## 2014-03-05 NOTE — Telephone Encounter (Signed)
Returned pt call and asked him to describe his symptoms.  Pt has been SOB but describes these symptoms as mild, reports improvement in symptoms today. Noteably, he also stated blurred vision and dizziness yesterday. He reports that beginning yesterday, he has had "acute indigestion" that he described as "strong" in severity. He took antacids w/ some apparent relief.  Pt denies chest pain. He sates the SOB was more apparent while he was at work. Pt would like to know if he needs to be evaluated here and would prefer not to go to the hospital. I did explain that these symptoms are concerning and that I did not want him to delay getting appropriate treatment, but that I would discuss with physician here and return the pt's call promptly.

## 2014-03-05 NOTE — Telephone Encounter (Signed)
He is not having active chest pain so I don't think he needs to be seen emergently. Indigestion symptoms can be evaluated by PCP. He can be scheduled for evaluation of dyspnea sometime next week.  Peter SwazilandJordan MD, [facility]

## 2014-03-05 NOTE — Telephone Encounter (Signed)
[name] is calling because he is having some shortness of Breath and Indigestion which he does not think that what it is . Would like someone to check him out. Please call    Thanks

## 2014-03-05 NOTE — Telephone Encounter (Signed)
Spoke with patient and communicated Dr. Elvis CoilJordan's recommendations. Pt stated understanding. Will route to [place] to schedule.

## 2014-03-11 ENCOUNTER — Ambulatory Visit
Admission: RE | Admit: 2014-03-11 | Discharge: 2014-03-11 | Disposition: A | Discharge status: Home / Self Care | Payer: Medicare Other | Source: Ambulatory Visit | Attending: Family Medicine | Admitting: Family Medicine

## 2014-03-11 DIAGNOSIS — M545 Low back pain: Secondary | ICD-10-CM

## 2014-03-12 ENCOUNTER — Ambulatory Visit: Payer: Medicare Other | Admitting: Cardiology

## 2014-03-23 ENCOUNTER — Encounter (HOSPITAL_COMMUNITY): Payer: Self-pay | Admitting: *Deleted

## 2014-03-30 ENCOUNTER — Other Ambulatory Visit: Payer: Self-pay | Admitting: Neurosurgery

## 2014-03-30 DIAGNOSIS — M5136 Other intervertebral disc degeneration, lumbar region: Secondary | ICD-10-CM

## 2014-04-01 ENCOUNTER — Inpatient Hospital Stay: Admission: RE | Admit: 2014-04-01 | Payer: Medicare Other | Source: Ambulatory Visit

## 2014-04-05 ENCOUNTER — Ambulatory Visit
Admission: RE | Admit: 2014-04-05 | Discharge: 2014-04-05 | Disposition: A | Discharge status: Home / Self Care | Payer: Medicare Other | Source: Ambulatory Visit | Attending: Neurosurgery | Admitting: Neurosurgery

## 2014-04-05 DIAGNOSIS — M5136 Other intervertebral disc degeneration, lumbar region: Secondary | ICD-10-CM

## 2014-04-13 ENCOUNTER — Ambulatory Visit (HOSPITAL_COMMUNITY)
Admission: RE | Admit: 2014-04-13 | Discharge: 2014-04-13 | Disposition: A | Discharge status: Home / Self Care | Payer: Medicare Other | Source: Ambulatory Visit | Attending: Cardiology | Admitting: Cardiology

## 2014-04-13 DIAGNOSIS — I739 Peripheral vascular disease, unspecified: Secondary | ICD-10-CM | POA: Insufficient documentation

## 2014-04-13 NOTE — Progress Notes (Signed)
Carotid Duplex Completed. °[name],RVT °

## 2014-04-23 ENCOUNTER — Encounter: Payer: Self-pay | Admitting: *Deleted

## 2014-04-26 ENCOUNTER — Other Ambulatory Visit: Payer: Self-pay | Admitting: Cardiovascular Disease

## 2014-04-26 NOTE — Telephone Encounter (Signed)
Rx refill sent to patient pharmacy   

## 2014-06-10 ENCOUNTER — Ambulatory Visit
Admission: RE | Admit: 2014-06-10 | Discharge: 2014-06-10 | Disposition: A | Discharge status: Home / Self Care | Payer: Medicare Other | Source: Ambulatory Visit | Attending: Family Medicine | Admitting: Family Medicine

## 2014-06-10 ENCOUNTER — Other Ambulatory Visit: Payer: Self-pay | Admitting: Family Medicine

## 2014-06-10 DIAGNOSIS — J40 Bronchitis, not specified as acute or chronic: Secondary | ICD-10-CM

## 2014-06-24 ENCOUNTER — Encounter: Payer: Self-pay | Admitting: Cardiovascular Disease

## 2014-06-24 ENCOUNTER — Telehealth: Payer: Self-pay | Admitting: Cardiovascular Disease

## 2014-06-25 NOTE — Telephone Encounter (Signed)
Closed encounter °

## 2014-07-13 ENCOUNTER — Ambulatory Visit: Payer: Medicare Other | Admitting: Cardiovascular Disease

## 2014-07-28 ENCOUNTER — Encounter (INDEPENDENT_AMBULATORY_CARE_PROVIDER_SITE_OTHER): Payer: Medicare Other

## 2014-07-28 ENCOUNTER — Encounter: Payer: Self-pay | Admitting: Nurse Practitioner

## 2014-07-28 ENCOUNTER — Encounter: Payer: Self-pay | Admitting: *Deleted

## 2014-07-28 ENCOUNTER — Ambulatory Visit (INDEPENDENT_AMBULATORY_CARE_PROVIDER_SITE_OTHER): Payer: Medicare Other | Admitting: Nurse Practitioner

## 2014-07-28 ENCOUNTER — Telehealth: Payer: Self-pay | Admitting: Cardiology

## 2014-07-28 VITALS — BP 130/70 | HR 104 | Ht 70.0 in | Wt 189.2 lb

## 2014-07-28 DIAGNOSIS — I4891 Unspecified atrial fibrillation: Secondary | ICD-10-CM

## 2014-07-28 DIAGNOSIS — I499 Cardiac arrhythmia, unspecified: Secondary | ICD-10-CM

## 2014-07-28 DIAGNOSIS — I429 Cardiomyopathy, unspecified: Secondary | ICD-10-CM

## 2014-07-28 LAB — BASIC METABOLIC PANEL
BUN: 52 mg/dL — ABNORMAL HIGH (ref 6–23)
CO2: 25 mEq/L (ref 19–32)
Calcium: 9.3 mg/dL (ref 8.4–10.5)
Chloride: 103 mEq/L (ref 96–112)
Creatinine, Ser: 2.05 mg/dL — ABNORMAL HIGH (ref 0.40–1.50)
GFR: 33.39 mL/min — ABNORMAL LOW (ref >60.00)
Glucose, Bld: 195 mg/dL — ABNORMAL HIGH (ref 70–99)
Potassium: 3.9 mEq/L (ref 3.5–5.1)
Sodium: 137 mEq/L (ref 135–145)

## 2014-07-28 LAB — CBC
HCT: 33.6 % — ABNORMAL LOW (ref 39.0–52.0)
Hemoglobin: 11.4 g/dL — ABNORMAL LOW (ref 13.0–17.0)
MCHC: 34 g/dL (ref 30.0–36.0)
MCV: 89.3 fl (ref 78.0–100.0)
Platelets: 224 10*3/uL (ref 150.0–400.0)
RBC: 3.77 Mil/uL — ABNORMAL LOW (ref 4.22–5.81)
RDW: 15.5 % (ref 11.5–15.5)
WBC: 7.7 10*3/uL (ref 4.0–10.5)

## 2014-07-28 LAB — TSH: TSH: 1.75 u[IU]/mL (ref 0.35–4.50)

## 2014-07-28 MED ORDER — DIGOXIN 125 MCG PO TABS: 0.0625 mg | ORAL_TABLET | Freq: Every day | ORAL | Status: DC

## 2014-07-28 MED ORDER — CARVEDILOL 25 MG PO TABS: 37.5000 mg | ORAL_TABLET | Freq: Two times a day (BID) | ORAL | Status: DC

## 2014-07-28 NOTE — Telephone Encounter (Signed)
Dr Abel Prestoolodonato called. Mr [name] is in his office today and has had some problems with recurrent CHF symptoms and an irregular heart rate. He asked if we could see the patient today in the office, he did not feel the pt needed to go to the hospital. I have spoken with the triage RN to get him placed on the [facility] schedule.   Corine ShelterLUKE Perline Awe PA-C 07/28/2014 9:12 AM

## 2014-07-28 NOTE — Progress Notes (Signed)
Patient ID: [name], male   DOB: 1933-02-17, 81 y.o.   MRN: [id number] [facility] 48 hour holter monitor applied to patient.  Patient will return monitor Tuesday, 08/03/2014.

## 2014-07-28 NOTE — Patient Instructions (Addendum)
We will be checking the following labs today - BMET, TSH and CBC   Medication Instructions:    Continue with your current medicines but   I am increasing the Coreg to 37.5 mg twice a day - this has been sent to [facility]  I am adding Lanoxin 0.125 mg - take only a half a tablet each day - this is to help slow your heart rate down.     Testing/Procedures To Be Arranged:  Dr. Graciela HusbandsKlein will be looking into an esophageal probe - this is to look at your rhythm better  48 hour Holter  Echocardiogram  Follow-Up:   See Dr. Allyson SabalBerry as planned    Other Special Instructions:   Restrict salt  Weigh every day  [facility] office at [phone] if you have any questions, problems or concerns.

## 2014-07-28 NOTE — Progress Notes (Addendum)
CARDIOLOGY OFFICE NOTE  Date:  07/28/2014    [name] Date of Birth: 1933/02/19 Medical Record #[id number]  PCP:  [name], MD  Cardiologist:  Allyson Sabal    Chief Complaint  Patient presents with  . Irregular Heart Beat    Work in visit -seen for Dr. Allyson Sabal     History of Present Illness: [name] is a 81 y.o. male who presents today for a work in visit. Seen for Dr. Allyson Sabal. He has a history of CAD. He had CABG in '94. Other issues include CKD, HTN, HLD, GERD, and DM.  In December 2012 he had angina and underwent an attempted PCI to the CFX but the tip of the catheter broke off and the procedure was aborted. Fortunately he has done well since. He had a Myoview Jan 2013 that was low risk. His EF at that time was 42%. He was admitted in Oct 2014 with a SBO that was treated conservatively. He had an echo then that showed his EF to be 55%. He has had his medications adjusted by Pharmacist Phillips Hay. His ARB had been stopped secondary to hyperkalemia. She ultimately was able to achieve good BP control with adjustment of his Hydralazine. Last seen by Dr Allyson Sabal in Aug this year and ordered an event monitor for episodic dizziness. This revealed frequent PACs and short, 3-5 beat runs, of PAT.   Last seen by [name], PA in October - felt to be doing well.  Phone call today - "Dr Abel Presto called. [name] is in his office today and has had some problems with recurrent CHF symptoms and an irregular heart rate. He asked if we could see the patient today in the office, he did not feel the pt needed to go to the hospital. I have spoken with the triage RN to get him placed on the Flex clinic schedule."  Thus added to the FLEX.   Comes in today. Here with his wife. He says he is feeling fine. No chest pain. Not short of breath. No palpitations. Not dizzy or lightheaded. He does not understand why he is here. Has had to use extra Lasix on 2 occasions - back in  October and last month - no real clear cut trigger. Resolved with extra diuretic. No chest pain. Says he has been doing more in the way of activity. Has been taking his medicines. He is on Coreg 25 mg BID.   Past Medical History  Diagnosis Date  . Angina at rest 02/27/2011  . CAD (coronary artery disease) 02/27/2011    Catheterized on 02/28/11, graft supplying marginal vessel was occluded, after 48 hours of hydration, a repeat cath was performed in an attempt at PCI 03/02/11 cath had an unsuccessful attempt at PCI to an occluded calcified circumflex vessel, Residual distal tip of the ChoICE PT moderate support wire at the subtotal occlusion. No change in the subtotal occlusion,improved retrograde collateralization  . LV dysfunction 02/27/2011  . CKD (chronic kidney disease) stage 3, GFR 30-59 ml/min 02/27/2011  . LBBB (left bundle branch block) 02/27/2011  . Hypertension   . Hyperlipemia 02/27/2011  . Diabetes mellitus 02/27/2011    insulin dependent  . GERD (gastroesophageal reflux disease)   . CHF (congestive heart failure)   . Blood transfusion     no reaction to transfusion per patient  . Arthritis   . Myocardial infarction   . PVD (peripheral vascular disease)     Carotid Angiography was performed in  2007, no treatment delivered at that time. Right carotid endarterectomy in 2009, being followed by duplex ultrasound. Doppler remains stable.  [name] CAD (coronary artery disease)     PTCA of the right coronary artery in Feb. 1987, July 1987, and June 1990. Circumflex PTCA in July 1993, followed by a RCA, PTCA in Sept. 1994. CABG in 1994x5 with a LIMA to the LAD, vein graft to the first diagonal, vein graft to the marginal and left circumflex coronary artery, and sequential graft to the PDA and PLA branches of the RCA  . HTN (hypertension)     Renal dopplers have been performed on a semi annual basis. Left renal artery demonstrated vessel narrowing with elevated velocities consistent with a 1-59%  diameter reduction.   . carotid doppler     Left ECA : The proximal most aspect of the left external carotid artery demonstrates a peak systolic velocity of 493.9 cm/s. This is consistent with a 70-99% significant stenosis correlated with a 2007 angiogram.    Past Surgical History  Procedure Laterality Date  . Coronary artery bypass graft  1994  . Spinal fusion    . Back surgery    . Carotid endarterectomy  2009  . Myoview scan  03/30/2011  . Left heart catheterization with coronary/graft angiogram N/A 02/28/2011    Procedure: LEFT HEART CATHETERIZATION WITH Isabel Caprice;  Surgeon: Lennette Bihari, MD;  Location: [facility] CATH LAB;  Service: Cardiovascular;  Laterality: N/A;  . Percutaneous coronary stent intervention (pci-s) N/A 03/02/2011    Procedure: PERCUTANEOUS CORONARY STENT INTERVENTION (PCI-S);  Surgeon: Lennette Bihari, MD;  Location: [facility] CATH LAB;  Service: Cardiovascular;  Laterality: N/A;     Medications: Current Outpatient Prescriptions  Medication Sig Dispense Refill  . acetaminophen (TYLENOL) 325 MG tablet Take 650 mg by mouth every 6 (six) hours as needed for pain.    [name] amLODipine (NORVASC) 10 MG tablet Take 10 mg by mouth daily.      [name] aspirin EC 81 MG tablet Take 81 mg by mouth at bedtime.      . carvedilol (COREG) 25 MG tablet Take 1.5 tablets (37.5 mg total) by mouth 2 (two) times daily with a meal. 90 tablet 6  . cholecalciferol (VITAMIN D) 1000 UNITS tablet Take 1,000 Units by mouth daily.    . clopidogrel (PLAVIX) 75 MG tablet Take 1 tablet (75 mg total) by mouth daily. 30 tablet 10  . diphenhydrAMINE (BENADRYL) 25 MG tablet Take 25 mg by mouth daily.    [name] FOLIC ACID PO Take 600 mcg by mouth daily.    . furosemide (LASIX) 40 MG tablet Take 40 mg by mouth daily.      [name] glipiZIDE (GLUCOTROL) 10 MG tablet Take 20 mg by mouth 2 (two) times daily.     . Glucosamine-Chondroitin (MOVE FREE PO) Take 1 tablet by mouth 2 (two) times daily.     . hydrALAZINE (APRESOLINE)  50 MG tablet Take 1 tablet (50 mg total) by mouth 2 (two) times daily. 180 tablet 1  . isosorbide mononitrate (IMDUR) 60 MG 24 hr tablet Take 60 mg by mouth daily.    [name] loratadine (CLARITIN) 10 MG tablet Take 10 mg by mouth daily.    . Multiple Vitamins-Minerals (MULTIVITAMINS THER. W/MINERALS) TABS Take 1 tablet by mouth daily.      [name] omeprazole (PRILOSEC) 20 MG capsule Take 20 mg by mouth 2 (two) times daily.     . pravastatin (PRAVACHOL) 40 MG tablet Take 40 mg  by mouth at bedtime.      . digoxin (LANOXIN) 0.125 MG tablet Take 0.5 tablets (0.0625 mg total) by mouth daily. 30 tablet 3   No current facility-administered medications for this visit.    Allergies: Allergies  Allergen Reactions  . Lisinopril Cough    Social History: The patient  reports that he has never smoked. He has never used smokeless tobacco. He reports that he does not drink alcohol or use illicit drugs.   Family History: The patient's family history includes Cancer in his father, mother, and paternal grandfather.   Review of Systems: Please see the history of present illness.   Otherwise, the review of systems is positive for irregular heart beat.   All other systems are reviewed and negative.   Physical Exam: VS:  BP 130/70 mmHg  Pulse 104  Ht 5\' 10"  (1.778 m)  Wt 189 lb 3.2 oz (85.821 kg)  BMI 27.15 kg/m2 .  BMI Body mass index is 27.15 kg/(m^2).   Weight is down 7 pounds from last visit here.   Wt Readings from Last 3 Encounters:  07/28/14 189 lb 3.2 oz (85.821 kg)  01/13/14 196 lb 14.4 oz (89.313 kg)  12/21/13 200 lb 9.6 oz (90.992 kg)    General: Pleasant. Well developed, well nourished and in no acute distress. His weight is down 7 pounds.  HEENT: Normal. Neck: Supple, no JVD, carotid bruits, or masses noted.  Cardiac: Regular rate and rhythm. His rate is fast. Trace edema.  Respiratory:  Lungs are clear to auscultation bilaterally with normal work of breathing.  GI: Soft and nontender.  MS:  No deformity or atrophy. Gait and ROM intact. Skin: Warm and dry. Color is normal.  Neuro:  Strength and sensation are intact and no gross focal deficits noted.  Psych: Alert, appropriate and with normal affect.   LABORATORY DATA:  EKG:  EKG is ordered today. Unclear rhythm.   Lab Results  Component Value Date   WBC 6.9 12/23/2012   HGB 10.7* 12/23/2012   HCT 32.5* 12/23/2012   PLT 171 12/23/2012   GLUCOSE 251* 11/18/2013   CHOL  07/29/2008    103        ATP III CLASSIFICATION:  <200     mg/dL   Desirable  [id number]  mg/dL   Borderline High  >=914>=240    mg/dL   High          TRIG [id number] 07/29/2008   HDL 29* 07/29/2008   LDLCALC  07/29/2008    51        Total Cholesterol/HDL:CHD Risk Coronary Heart Disease Risk Table                     Men   Women  1/2 Average Risk   3.4   3.3  Average Risk       5.0   4.4  2 X Average Risk   9.6   7.1  3 X Average Risk  23.4   11.0        Use the calculated Patient Ratio above and the CHD Risk Table to determine the patient's CHD Risk.        ATP III CLASSIFICATION (LDL):  <100     mg/dL   Optimal  [id number]  mg/dL   Near or Above                    Optimal  130-159  mg/dL   Borderline  [id number]  mg/dL   High  >782>190     mg/dL   Very High   ALT 13 95/62/130809/29/2014   AST 16 12/22/2012   NA 138 11/18/2013   K 5.2 11/18/2013   CL 107 11/18/2013   CREATININE 1.74* 11/18/2013   BUN 44* 11/18/2013   CO2 21 11/18/2013   TSH 0.691 Test methodology is 3rd generation TSH 07/29/2008   INR 1.18 02/28/2011   HGBA1C 6.7* 12/22/2012    BNP (last 3 results) No results for input(s): BNP in the last 8760 hours.  ProBNP (last 3 results) No results for input(s): PROBNP in the last 8760 hours.   Other Studies Reviewed Today:  Echo Study Conclusions from 11/2012 - Left ventricle: The cavity size was normal. There was mild concentric hypertrophy. Systolic function was normal. The estimated ejection fraction was in the range of 55% to 60%. Doppler  parameters are consistent with abnormal left ventricular relaxation (grade 1 diastolic dysfunction). - Mitral valve: Calcified annulus. Trivial regurgitation. - Left atrium: The atrium was at the upper limits of normal in size (27 ml/m2). - Tricuspid valve: Moderate regurgitation. - Pulmonary arteries: PA peak pressure: 54mm Hg (S). - Inferior vena cava: The vessel was dilated; the respirophasic diameter changes were blunted (< 50%); findings are consistent with elevated central venous pressure.   Assessment/Plan: 1. Irregular heart beat - he is tachycardic today - this is unusual for him - on Coreg 25 mg BID. EKG unclear. Reviewed with Dr. Patty SermonsBrackbill. Reviewed with Dr. Graciela HusbandsKlein. Possibly AF - trying to get limited echo to look at his atrium - see if contracting like AF. This did not show definite AF. EF however is low - 25 to 30%.   Will assume he has a tachycardia mediated CM - EF 25 to 30% - need to rate control. Will increase Coreg to 37.5 BID. We will add Digoxin 0.125 to take only 1/2 pill daily (0.0625). Discussed with Dr. Abel Prestoolodonato who felt his renal function is stable for this dose. Dr. Graciela HusbandsKlein is going to look into doing an esophageal probe to further define the rhythm.   2. Diastolic HF - looks compensated at this time. His weight is down. He is to continue to monitor weight and restrict salt.   3. CKD - followed by Dr. Abel Prestoolodonato  4. HTN - BP good on current regimen.  5. CAD -managed medically. No active symptoms.   Current medicines are reviewed with the patient today.  The patient does not have concerns regarding medicines other than what has been noted above.  The following changes have been made:  See above.  Labs/ tests ordered today include:    Orders Placed This Encounter  Procedures  . Basic metabolic panel  . CBC  . TSH  . Holter monitor - 48 hour  . EKG 12-Lead  . Echocardiogram     Disposition:   FU with Dr. Allyson SabalBerry as planned.   Patient is  agreeable to this plan and will call if any problems develop in the interim.   Signed: Rosalio MacadamiaLori C. Kennidy Lamke, RN, ANP-C 07/28/2014 3:40 PM  [facility] [address] [place], [place]  [address] Phone: [phone] Fax: [phone]        Addendum: 08/13/2014 I have reviewed the Holter. Have attempted to review with Dr. Graciela HusbandsKlein to no avail. Reviewed with Dr. Clifton JamesMcAlhany (DOD) here in the office today. We both feel that this shows AF. No need to proceed with esophageal probe. Will  stop Plavix. Start Eliquis 2.5 mg BID. Patient will be turning 82 in less than 2 months and has a creatinine over 1.5 so will use the lower dose. Would stop aspirin after this weekend.   Rosalio Macadamia, RN, ANP-C [facility] [address] [place], [place]  [address] [phone]

## 2014-07-30 ENCOUNTER — Telehealth: Payer: Self-pay | Admitting: Cardiovascular Disease

## 2014-07-30 NOTE — Telephone Encounter (Signed)
Received [facility] for appointment with Dr Allyson SabalBerry on 08/17/14.  [facility] for Dr Hazle CocaBerry's schedule on 08/17/14. lp

## 2014-08-02 ENCOUNTER — Telehealth: Payer: Self-pay | Admitting: *Deleted

## 2014-08-02 ENCOUNTER — Other Ambulatory Visit: Payer: Self-pay | Admitting: *Deleted

## 2014-08-02 DIAGNOSIS — I4891 Unspecified atrial fibrillation: Secondary | ICD-10-CM

## 2014-08-02 NOTE — Telephone Encounter (Signed)
S/w pt took a whole tablet of dig for two days by accident and Sunday started taking a half a tablet of dig.  Will drop monitor off today and will come in tomorrow for a echo and repeat labs with holding dig tomorrow.

## 2014-08-03 ENCOUNTER — Other Ambulatory Visit (INDEPENDENT_AMBULATORY_CARE_PROVIDER_SITE_OTHER): Payer: Medicare Other | Admitting: *Deleted

## 2014-08-03 ENCOUNTER — Other Ambulatory Visit: Payer: Self-pay

## 2014-08-03 ENCOUNTER — Ambulatory Visit (HOSPITAL_COMMUNITY): Payer: Medicare Other | Attending: Cardiovascular Disease

## 2014-08-03 DIAGNOSIS — E785 Hyperlipidemia, unspecified: Secondary | ICD-10-CM | POA: Diagnosis not present

## 2014-08-03 DIAGNOSIS — I429 Cardiomyopathy, unspecified: Secondary | ICD-10-CM

## 2014-08-03 DIAGNOSIS — I4891 Unspecified atrial fibrillation: Secondary | ICD-10-CM

## 2014-08-03 DIAGNOSIS — I499 Cardiac arrhythmia, unspecified: Secondary | ICD-10-CM | POA: Diagnosis not present

## 2014-08-03 DIAGNOSIS — I10 Essential (primary) hypertension: Secondary | ICD-10-CM | POA: Insufficient documentation

## 2014-08-03 DIAGNOSIS — E119 Type 2 diabetes mellitus without complications: Secondary | ICD-10-CM | POA: Diagnosis not present

## 2014-08-03 LAB — BASIC METABOLIC PANEL
BUN: 55 mg/dL — ABNORMAL HIGH (ref 6–23)
CO2: 25 mEq/L (ref 19–32)
Calcium: 8.8 mg/dL (ref 8.4–10.5)
Chloride: 104 mEq/L (ref 96–112)
Creatinine, Ser: 2.15 mg/dL — ABNORMAL HIGH (ref 0.40–1.50)
GFR: 31.6 mL/min — ABNORMAL LOW (ref >60.00)
Glucose, Bld: 221 mg/dL — ABNORMAL HIGH (ref 70–99)
Potassium: 3.8 mEq/L (ref 3.5–5.1)
Sodium: 138 mEq/L (ref 135–145)

## 2014-08-04 LAB — DIGOXIN LEVEL: Digoxin Level: 0.8 ng/mL (ref 0.8–2.0)

## 2014-08-13 ENCOUNTER — Telehealth: Payer: Self-pay | Admitting: Cardiovascular Disease

## 2014-08-13 ENCOUNTER — Other Ambulatory Visit: Payer: Self-pay | Admitting: *Deleted

## 2014-08-13 ENCOUNTER — Telehealth: Payer: Self-pay | Admitting: *Deleted

## 2014-08-13 MED ORDER — APIXABAN 2.5 MG PO TABS: 2.5000 mg | ORAL_TABLET | Freq: Two times a day (BID) | ORAL | Status: DC

## 2014-08-13 NOTE — Telephone Encounter (Signed)
Closed encounter °

## 2014-08-13 NOTE — Telephone Encounter (Signed)
S/w pt's wife with pt listening is agreeable to plan will stop ASA ( 81 mg ) after this weekend, will stop plavix today and also start Eliquis ( 2.5 mg ) bid. Sent in today and updated medication list.

## 2014-08-17 ENCOUNTER — Ambulatory Visit: Payer: Medicare Other | Admitting: Cardiovascular Disease

## 2014-08-27 ENCOUNTER — Encounter: Payer: Self-pay | Admitting: Cardiovascular Disease

## 2014-08-27 ENCOUNTER — Ambulatory Visit (INDEPENDENT_AMBULATORY_CARE_PROVIDER_SITE_OTHER): Payer: Medicare Other | Admitting: Cardiovascular Disease

## 2014-08-27 VITALS — BP 172/70 | HR 62 | Ht 71.0 in | Wt 186.8 lb

## 2014-08-27 DIAGNOSIS — I739 Peripheral vascular disease, unspecified: Secondary | ICD-10-CM | POA: Diagnosis not present

## 2014-08-27 DIAGNOSIS — E785 Hyperlipidemia, unspecified: Secondary | ICD-10-CM

## 2014-08-27 DIAGNOSIS — I10 Essential (primary) hypertension: Secondary | ICD-10-CM | POA: Diagnosis not present

## 2014-08-27 DIAGNOSIS — I2583 Coronary atherosclerosis due to lipid rich plaque: Secondary | ICD-10-CM

## 2014-08-27 DIAGNOSIS — I480 Paroxysmal atrial fibrillation: Secondary | ICD-10-CM

## 2014-08-27 DIAGNOSIS — I2510 Atherosclerotic heart disease of native coronary artery without angina pectoris: Secondary | ICD-10-CM

## 2014-08-27 MED ORDER — EDOXABAN TOSYLATE 30 MG PO TABS
30.0000 mg | ORAL_TABLET | Freq: Every day | ORAL | Status: DC
Start: 2014-08-27 — End: 2014-09-13

## 2014-08-27 NOTE — Assessment & Plan Note (Signed)
History of right carotid endarterectomy performed in 2009 with recent carotid Dopplers performed 04/13/14 revealing a widely patent right endarterectomy site with moderate left ICA stenosis which we follow on a annual basis by duplex ultrasound. He is on dual antiplatelets therapy.

## 2014-08-27 NOTE — Assessment & Plan Note (Signed)
[name] was recently found to be in A. Fib and subsequent Holter monitor confirmed  A. Fib as well. He is in sinus rhythm today. He has a left bundle branch block. He was treated with L at rest however the cost was prohibitive. He is on aspirin and Plavix. I'm going to discontinue the Plavix and add a novel oral anticoagulated. He was also begun on digoxin which I am a little hesitant to use given his chronic renal insufficiency.

## 2014-08-27 NOTE — Assessment & Plan Note (Signed)
History of CAD/ischemic coronary myopathy with bypass grafting in 1994. He had attempt at rotational atherectomy of his circumflex couple. By fracture of a wire tip and circumflex infarct. Myoview performed 03/30/2011 showed mild lateral ischemia hospital by his anatomy and recent 2-D echo revealed an EF of 35-40%. He is really asymptomatic.

## 2014-08-27 NOTE — Assessment & Plan Note (Signed)
History of hypertension blood pressure measured at 172/70. He is on amlodipine, carvedilol, hydralazine. An ARB was stopped because of hyperkalemia in the past. His blood pressures have been better controlled in the past as well.

## 2014-08-27 NOTE — Patient Instructions (Addendum)
Medication Instructions:   Stop Plavix Stop Digoxin Continue Aspirin 81mg  daily Start Savaysa 30mg  daily. Activate the savings card to get savings on the medication each time you refill it.   Labwork:  n/a  Testing/Procedures:  n/a  Follow-Up:  3 months with an extender  6 months with an extender  Any Other Special Instructions Will Be Listed Below (If Applicable).

## 2014-08-27 NOTE — Progress Notes (Signed)
08/27/2014 [name]   October 30, 1932  [id number]  Primary Physician [name] Blamer, MD Primary Cardiologist: [name] Gess MD [name]   HPI:  The patient is a very pleasant 81 year old, mildly overweight, married Caucasian male, father of 2, grandfather to 1 grandchild who I last saw 10 months ago. He has a history of CAD status post coronary artery bypass grafting in 1994. He also has hypertension, hyperlipidemia, insulin-dependent diabetes and peripheral vascular disease. He had right carotid endarterectomy back in 2009 and has been followed by duplex ultrasound here in our office, most recently July of last year. He has mild renal insufficiency. His creatinines run in the 2 range. He was cath'd by Dr. Tresa Endo March 02, 2011, with a failed attempt at native circumflex recanalization complicated by fractured wire tip. His LIMA to his LAD was patent, as was the vein to a diagonal branch and a vein to the PDA and PLA sequentially. The vein to the marginal branch was occluded. His EF was 35% to 40% with anteroapical hypokinesia. He is totally asymptomatic. His Myoview performed March 30, 2011, showed mild lateral ischemia explainable by his anatomy. He does complain of some abdominal pain which apparently may be related to a hernia which she is seeing a Development worker, international aid for. Recent lab work performed in December revealed total cholesterol 127, LDL of 61 and HDL 38. He saw [name] PAC back in the office 12/09/12. His medicines were adjusted. He had a small bowel obstruction in October was treated with decompression. Apparently at that time his ejection fraction was increased by 2-D echo.   He was recently found to have episodes of A. Fib and an oral anticoagulant  was added  (Xarelto) as well as digoxin. A recent 2-D echo revealed an EF of 35-40%.   Current Outpatient Prescriptions  Medication Sig Dispense Refill  . acetaminophen (TYLENOL) 325 MG tablet Take 650 mg by  mouth every 6 (six) hours as needed for pain.    [name] amLODipine (NORVASC) 10 MG tablet Take 10 mg by mouth daily.      [name] aspirin 81 MG tablet Take 81 mg by mouth daily.    . carvedilol (COREG) 25 MG tablet Take 1.5 tablets (37.5 mg total) by mouth 2 (two) times daily with a meal. 90 tablet 6  . cholecalciferol (VITAMIN D) 1000 UNITS tablet Take 1,000 Units by mouth daily.    . clopidogrel (PLAVIX) 75 MG tablet Take 75 mg by mouth daily.    . digoxin (LANOXIN) 0.125 MG tablet Take 0.5 tablets (0.0625 mg total) by mouth daily. 30 tablet 3  . diphenhydrAMINE (BENADRYL) 25 MG tablet Take 25 mg by mouth daily.    [name] FOLIC ACID PO Take 600 mcg by mouth daily.    . furosemide (LASIX) 40 MG tablet Take 40 mg by mouth daily.      [name] glipiZIDE (GLUCOTROL) 10 MG tablet Take 20 mg by mouth 2 (two) times daily.     . Glucosamine-Chondroitin (MOVE FREE PO) Take 1 tablet by mouth 2 (two) times daily.     . hydrALAZINE (APRESOLINE) 50 MG tablet Take 1 tablet (50 mg total) by mouth 2 (two) times daily. 180 tablet 1  . isosorbide mononitrate (IMDUR) 60 MG 24 hr tablet Take 60 mg by mouth daily.    [name] loratadine (CLARITIN) 10 MG tablet Take 10 mg by mouth daily.    . Multiple Vitamins-Minerals (MULTIVITAMINS THER. W/MINERALS) TABS Take 1 tablet by mouth daily.      [name]  omeprazole (PRILOSEC) 20 MG capsule Take 20 mg by mouth 2 (two) times daily.     . pravastatin (PRAVACHOL) 40 MG tablet Take 40 mg by mouth at bedtime.       No current facility-administered medications for this visit.    Allergies  Allergen Reactions  . Lisinopril Cough    History   Social History  . Marital Status: Married    Spouse Name: N/A  . Number of Children: N/A  . Years of Education: N/A   Occupational History  . Not on file.   Social History Main Topics  . Smoking status: Never Smoker   . Smokeless tobacco: Never Used     Comment: quit smoking in the 1960's  . Alcohol Use: No  . Drug Use: No  . Sexual Activity: Yes    Other Topics Concern  . Not on file   Social History Narrative     Review of Systems: General: negative for chills, fever, night sweats or weight changes.  Cardiovascular: negative for chest pain, dyspnea on exertion, edema, orthopnea, palpitations, paroxysmal nocturnal dyspnea or shortness of breath Dermatological: negative for rash Respiratory: negative for cough or wheezing Urologic: negative for hematuria Abdominal: negative for nausea, vomiting, diarrhea, bright red blood per rectum, melena, or hematemesis Neurologic: negative for visual changes, syncope, or dizziness All other systems reviewed and are otherwise negative except as noted above.    Blood pressure 172/70, pulse 62, height  (1.803 m), weight 186 lb 12.8 oz (84.732 kg).  General appearance: cooperative and no distress Neck: no adenopathy, no JVD, supple, symmetrical, trachea midline, thyroid not enlarged, symmetric, no tenderness/mass/nodules and soft left carotid bruit Lungs: clear to auscultation bilaterally Heart: regular rate and rhythm, S1, S2 normal, no murmur, click, rub or gallop Extremities: 1+ pitting bilateral lower extremity edema  EKG sinus bradycardia at 54 with left branch block. I personally reviewed this EKG  ASSESSMENT AND PLAN:   PVD - RCE '09 History of right carotid endarterectomy performed in 2009 with recent carotid Dopplers performed 04/13/14 revealing a widely patent right endarterectomy site with moderate left ICA stenosis which we follow on a annual basis by duplex ultrasound. He is on dual antiplatelets therapy.   Hyperlipemia History of hyperlipidemia on pravastatin 40 mg a day followed by his PCP   Essential hypertension History of hypertension blood pressure measured at 172/70. He is on amlodipine, carvedilol, hydralazine. An ARB was stopped because of hyperkalemia in the past. His blood pressures have been better controlled in the past as well.   CAD - CABG '94. PCI  attempt 2012, Myoview low risk Jan 2013 History of CAD/ischemic coronary myopathy with bypass grafting in 1994. He had attempt at rotational atherectomy of his circumflex couple. By fracture of a wire tip and circumflex infarct. Myoview performed 03/30/2011 showed mild lateral ischemia hospital by his anatomy and recent 2-D echo revealed an EF of 35-40%. He is really asymptomatic.   Atrial fibrillation Mr. Hewins was recently found to be in A. Fib and subsequent Holter monitor confirmed  A. Fib as well. He is in sinus rhythm today. He has a left bundle branch block. He was treated with L at rest however the cost was prohibitive. He is on aspirin and Plavix. I'm going to discontinue the Plavix and add a novel oral anticoagulated. He was also begun on digoxin which I am a little hesitant to use given his chronic renal insufficiency.       [name] Gess MD FACP,FACC,FAHA,  FSCAI 08/27/2014 10:07 AM

## 2014-08-27 NOTE — Assessment & Plan Note (Signed)
History of hyperlipidemia on pravastatin 40 mg a day followed by his PCP 

## 2014-09-09 NOTE — Addendum Note (Signed)
Addended by: Barrie Dunker on: 09/09/2014 07:38 AM   Modules accepted: Orders

## 2014-09-13 ENCOUNTER — Telehealth: Payer: Self-pay | Admitting: Cardiovascular Disease

## 2014-09-13 MED ORDER — EDOXABAN TOSYLATE 30 MG PO TABS: 30.0000 mg | ORAL_TABLET | Freq: Every day | ORAL | Status: DC

## 2014-09-13 NOTE — Telephone Encounter (Signed)
Pt called in to report Savaysa would cost $1000 for 90-day prescription per info he received from his pharmacy. They would have to special-order it. He does not know if the cost-savings discount card he received will work.  Informed him I would provide samples for him, forward to [place] who may be more familiar w/ cost-savings program for this medication. Pt voiced understanding.

## 2014-09-13 NOTE — Telephone Encounter (Signed)
Please call,concerning his medicine. The Savaysa is expensive too.

## 2014-09-21 MED ORDER — EDOXABAN TOSYLATE 30 MG PO TABS: 30.0000 mg | ORAL_TABLET | Freq: Every day | ORAL | Status: DC

## 2014-09-21 NOTE — Addendum Note (Signed)
Addended by: Rosalee KaufmanALVSTAD, Lamark Schue L. on: 09/21/2014 02:55 PM   Modules accepted: Orders

## 2014-09-21 NOTE — Telephone Encounter (Signed)
Per patient, originally given Eliquis but would cost > $1000 per 3 month supply.  Was given samples of Savaysa with card for $4/month.  Per patient did activate card, but did not take to [facility].  Explained how card works.  Pt would also like to [facility] to see if they will pay for either Eliquis or Savaysa.

## 2014-12-06 ENCOUNTER — Ambulatory Visit
Admission: RE | Admit: 2014-12-06 | Discharge: 2014-12-06 | Disposition: A | Discharge status: Home / Self Care | Payer: Medicare Other | Source: Ambulatory Visit | Attending: Family Medicine | Admitting: Family Medicine

## 2014-12-06 ENCOUNTER — Other Ambulatory Visit: Payer: Self-pay | Admitting: Family Medicine

## 2014-12-06 DIAGNOSIS — R509 Fever, unspecified: Secondary | ICD-10-CM

## 2014-12-14 ENCOUNTER — Ambulatory Visit (INDEPENDENT_AMBULATORY_CARE_PROVIDER_SITE_OTHER): Payer: Medicare Other | Admitting: Cardiology

## 2014-12-14 ENCOUNTER — Encounter: Payer: Self-pay | Admitting: Cardiology

## 2014-12-14 VITALS — BP 138/64 | HR 66 | Ht 70.0 in | Wt 191.3 lb

## 2014-12-14 DIAGNOSIS — N189 Chronic kidney disease, unspecified: Secondary | ICD-10-CM

## 2014-12-14 DIAGNOSIS — Z7901 Long term (current) use of anticoagulants: Secondary | ICD-10-CM | POA: Insufficient documentation

## 2014-12-14 DIAGNOSIS — I255 Ischemic cardiomyopathy: Secondary | ICD-10-CM | POA: Diagnosis not present

## 2014-12-14 DIAGNOSIS — I480 Paroxysmal atrial fibrillation: Secondary | ICD-10-CM | POA: Diagnosis not present

## 2014-12-14 DIAGNOSIS — I447 Left bundle-branch block, unspecified: Secondary | ICD-10-CM

## 2014-12-14 DIAGNOSIS — N183 Chronic kidney disease, stage 3 unspecified: Secondary | ICD-10-CM

## 2014-12-14 DIAGNOSIS — I10 Essential (primary) hypertension: Secondary | ICD-10-CM

## 2014-12-14 MED ORDER — FUROSEMIDE 40 MG PO TABS: ORAL_TABLET | ORAL | Status: DC

## 2014-12-14 NOTE — Assessment & Plan Note (Signed)
Savaysa

## 2014-12-14 NOTE — Progress Notes (Signed)
12/14/2014 [name]   06-14-1932  [id number]  Primary Physician [name] Blamer, MD Primary Cardiologist: Dr [name]  HPI:  81 year old male, followed by Dr [name] with a history of CAD, status post coronary artery bypass grafting in 1994. He also has hypertension, hyperlipidemia, insulin-dependent diabetes, CRI, and peripheral vascular disease. He had right carotid endarterectomy back in 2009 and has been followed by duplex ultrasound here in our office. His creatinines run in the 2 range, he is followed by Dr [name]. He was cath'd by Dr. Tresa [name] March 02, 2011, with a failed attempt at native circumflex recanalization complicated by fractured wire tip. His LIMA to his LAD was patent, as was the vein to a diagonal branch and a vein to the PDA and PLA sequentially. The vein to the marginal branch was occluded. His EF was 35% to 40% with anteroapical hypokinesia. His Myoview performed March 30, 2011, showed mild lateral ischemia explainable by his anatomy.            He was last seen by [name] Fredrickson NP as an add on in the [facility] 07/28/14 for AF and CHF. His Coreg was increased for rate control. Since then he has had an OP febrile illness in the last couple of weeks treated with Doxycycline. He says he is better but complains of a dry cough and also some SOB when he lays flat in bed. His wgt is up a couple of pounds since his LOV (189-191).    Current Outpatient Prescriptions  Medication Sig Dispense Refill  . acetaminophen (TYLENOL) 325 MG tablet Take 650 mg by mouth every 6 (six) hours as needed for pain.    [name] amLODipine (NORVASC) 10 MG tablet Take 10 mg by mouth daily.      [name] aspirin 81 MG tablet Take 81 mg by mouth daily.    . carvedilol (COREG) 25 MG tablet Take 1.5 tablets (37.5 mg total) by mouth 2 (two) times daily with a meal. 90 tablet 6  . cholecalciferol (VITAMIN D) 1000 UNITS tablet Take 1,000 Units by mouth daily.    . diphenhydrAMINE (BENADRYL) 25 MG tablet Take 25  mg by mouth daily.    [name] edoxaban (SAVAYSA) 30 MG TABS tablet Take 1 tablet (30 mg total) by mouth daily. 90 tablet 1  . FOLIC ACID PO Take 600 mcg by mouth daily.    [name] glipiZIDE (GLUCOTROL) 10 MG tablet Take 20 mg by mouth 2 (two) times daily.     . Glucosamine-Chondroitin (MOVE FREE PO) Take 1 tablet by mouth 2 (two) times daily.     . hydrALAZINE (APRESOLINE) 50 MG tablet Take 1 tablet (50 mg total) by mouth 2 (two) times daily. 180 tablet 1  . isosorbide mononitrate (IMDUR) 60 MG 24 hr tablet Take 60 mg by mouth daily.    [name] loratadine (CLARITIN) 10 MG tablet Take 10 mg by mouth daily.    . Multiple Vitamins-Minerals (MULTIVITAMINS THER. W/MINERALS) TABS Take 1 tablet by mouth daily.      [name] omeprazole (PRILOSEC) 20 MG capsule Take 20 mg by mouth 2 (two) times daily.     . pravastatin (PRAVACHOL) 40 MG tablet Take 40 mg by mouth at bedtime.      . furosemide (LASIX) 40 MG tablet Take 40 mg twice a day on Tues-Thurs-Sat take 40 mg daily on other days 180 tablet 3   No current facility-administered medications for this visit.    Allergies  Allergen Reactions  . Lisinopril Cough  Social History   Social History  . Marital Status: Married    Spouse Name: N/A  . Number of Children: N/A  . Years of Education: N/A   Occupational History  . Not on file.   Social History Main Topics  . Smoking status: Never Smoker   . Smokeless tobacco: Never Used     Comment: quit smoking in the 1960's  . Alcohol Use: No  . Drug Use: No  . Sexual Activity: Yes   Other Topics Concern  . Not on file   Social History Narrative     Review of Systems: General: negative for chills, fever, night sweats or weight changes.  Cardiovascular: negative for chest pain, dyspnea on exertion, edema, orthopnea, palpitations, paroxysmal nocturnal dyspnea or shortness of breath Dermatological: negative for rash Respiratory: negative for cough or wheezing Urologic: negative for hematuria Abdominal: negative  for nausea, vomiting, diarrhea, bright red blood per rectum, melena, or hematemesis Neurologic: negative for visual changes, syncope, or dizziness All other systems reviewed and are otherwise negative except as noted above.    Blood pressure 138/64, pulse 66, height  (1.778 m), weight 191 lb 4.8 oz (86.773 kg).  General appearance: alert, cooperative, no distress and HOH Neck: no carotid bruit and no JVD Lungs: few crackles Rt base Heart: regular rate and rhythm Abdomen: soft, non-tender; bowel sounds normal; no masses,  no organomegaly Extremities: extremities normal, atraumatic, no cyanosis or edema Skin: Skin color, texture, turgor normal. No rashes or lesions Neurologic: Grossly normal  EKG NSR, SB-59,  LBBB  ASSESSMENT AND PLAN:   Cardiomyopathy, ischemic- last EF 35-40% by echo May 2016 Mild volume overload today  CAD - CABG '94. PCI attempt 2012, Myoview low risk Jan 2013 No angina   Chronic renal insufficiency, stage III (moderate)- follwed by Dr [name] Due for f/u in Oct  Essential hypertension Controlled  PAF (paroxysmal atrial fibrillation) NSR today.  LBBB (left bundle branch block) Sinus bradycardia, LBBB  Chronic anticoagulation Savaysa   PLAN  I think [name] is mildly volume overloaded. I have increased his Lasix to 80 mg T/Th/ Sat and 40 mg other days. He has a follow up with Dr [name] in Oct so I did not order a f/u renal profile.   [name] K PA-C 12/14/2014 12:54 PM

## 2014-12-14 NOTE — Assessment & Plan Note (Signed)
Due for f/u in Oct

## 2014-12-14 NOTE — Assessment & Plan Note (Signed)
Controlled.  

## 2014-12-14 NOTE — Assessment & Plan Note (Signed)
No angina 

## 2014-12-14 NOTE — Assessment & Plan Note (Signed)
Sinus bradycardia, LBBB

## 2014-12-14 NOTE — Patient Instructions (Signed)
Increase Lasix to 40 mg twice a day on Tue-Thurs-Sat. Take 40 mg daily on other days   Schedule appointment with Dr.Berry in 3 months

## 2014-12-14 NOTE — Assessment & Plan Note (Deleted)
Mild volume overload today

## 2014-12-14 NOTE — Assessment & Plan Note (Signed)
NSR today 

## 2014-12-14 NOTE — Assessment & Plan Note (Signed)
Mild volume overload today 

## 2015-01-20 ENCOUNTER — Other Ambulatory Visit: Payer: Self-pay | Admitting: Nurse Practitioner

## 2015-01-20 ENCOUNTER — Telehealth: Payer: Self-pay | Admitting: Cardiovascular Disease

## 2015-01-20 ENCOUNTER — Encounter: Payer: Self-pay | Admitting: *Deleted

## 2015-01-20 MED ORDER — APIXABAN 5 MG PO TABS: 5.0000 mg | ORAL_TABLET | Freq: Two times a day (BID) | ORAL | Status: DC

## 2015-01-20 MED ORDER — APIXABAN 2.5 MG PO TABS: 5.0000 mg | ORAL_TABLET | Freq: Two times a day (BID) | ORAL | Status: DC

## 2015-01-20 NOTE — Telephone Encounter (Signed)
Spoke w/ patient, he requested relevant material/documentation sent to his home address.  Letter, printed Rx, and written instructions provided to patient - placed in outgoing mail after address confirmed.

## 2015-01-20 NOTE — Telephone Encounter (Signed)
Called patient. He states the [place] is not covering/recognizing Savaysa at all now, but they will cover Eliquis -- can he be switched back to Eliquis? He has been on this in the past. He would also need a note provided from us, furnished to the [place] stating why he needs to be on the medication.  He currently has about 2 week supply of Savaysa left. Requesting we furnish letter and approve change back to Eliquis.

## 2015-01-20 NOTE — Telephone Encounter (Signed)
Per the Answering Service:He needs to talk to RX or nurse to fix script-VA needs letter why he needs prescription.

## 2015-01-20 NOTE — Telephone Encounter (Signed)
Ok to switch back to Eliquis, would need 2.5 mg twice daily with first dose 24 hours after last dose of Savaysa.  Letter just needs to state that Eliquis has less bleed risk than warfarin and therefore safer for this patient.

## 2015-01-21 ENCOUNTER — Other Ambulatory Visit: Payer: Self-pay

## 2015-01-21 MED ORDER — CARVEDILOL 25 MG PO TABS: ORAL_TABLET | ORAL | Status: DC

## 2015-02-07 ENCOUNTER — Telehealth: Payer: Self-pay | Admitting: Cardiovascular Disease

## 2015-02-07 NOTE — Telephone Encounter (Signed)
Returned call to patient.He stated he is waiting on eliquis approval.Samples of eliquis 2.5 mg take 2 tablets twice a day,4 boxes left at [facility] front desk.

## 2015-02-07 NOTE — Telephone Encounter (Signed)
Pt called in wanting to speak with [name] about some medications of his, one specifically, Eliquis. Please f/u with him  Thanks

## 2015-02-08 ENCOUNTER — Telehealth: Payer: Self-pay | Admitting: *Deleted

## 2015-02-08 NOTE — Telephone Encounter (Signed)
Dose clarification for eliquis. Pt should be taking 2.5mg  BID per verbal from Phillips HayKristin Alvstad (previous encounter note) - had been written for 5mg  BID. VA approved dose of 2.5mg  BID, this is what patient is currently taking. No further concerns.

## 2015-03-04 ENCOUNTER — Telehealth: Payer: Self-pay | Admitting: Cardiovascular Disease

## 2015-03-04 NOTE — Telephone Encounter (Signed)
Lurena JoinerRebecca called in wanting to inform Dr. Allyson SabalBerry that they have been providing Eliquis for the pt. At the last visit she noticed that his kidney function has worsened and his creatine had increased from 2.1 to 2.53 along with his BUN increasing as well. Due to his current Kidney state they can not longer provider the Eliquis and is suggesting Warfarin. Please f/u with her is you need to.   Thanks

## 2015-03-04 NOTE — Telephone Encounter (Signed)
Call regarding patient's anticoagulant therapy.

## 2015-03-07 NOTE — Telephone Encounter (Signed)
Spoke with patient, he states VA rule is no Eliquis if SCr is >2.5.  His last was 2.53.  He has enough Eliquis for another month.  He feels that increase in SCr is due to recent heart failure episode and higher doses of furosemide.  Is currently cutting back from bid to qd dosing and hopes that [place] will repeat BMET in another week or two.  Patient understands to keep us posted.

## 2015-03-13 ENCOUNTER — Encounter (HOSPITAL_COMMUNITY): Payer: Self-pay | Admitting: Emergency Medicine

## 2015-03-13 ENCOUNTER — Emergency Department (HOSPITAL_COMMUNITY)
Admission: EM | Admit: 2015-03-13 | Discharge: 2015-03-13 | Disposition: A | Discharge status: Home / Self Care | Payer: Medicare Other | Attending: Emergency Medicine | Admitting: Emergency Medicine

## 2015-03-13 ENCOUNTER — Emergency Department (HOSPITAL_COMMUNITY): Payer: Medicare Other

## 2015-03-13 DIAGNOSIS — R609 Edema, unspecified: Secondary | ICD-10-CM | POA: Diagnosis not present

## 2015-03-13 DIAGNOSIS — Z951 Presence of aortocoronary bypass graft: Secondary | ICD-10-CM | POA: Insufficient documentation

## 2015-03-13 DIAGNOSIS — R0602 Shortness of breath: Secondary | ICD-10-CM | POA: Insufficient documentation

## 2015-03-13 DIAGNOSIS — M1990 Unspecified osteoarthritis, unspecified site: Secondary | ICD-10-CM | POA: Diagnosis not present

## 2015-03-13 DIAGNOSIS — I208 Other forms of angina pectoris: Secondary | ICD-10-CM

## 2015-03-13 DIAGNOSIS — I509 Heart failure, unspecified: Secondary | ICD-10-CM | POA: Insufficient documentation

## 2015-03-13 DIAGNOSIS — I255 Ischemic cardiomyopathy: Secondary | ICD-10-CM | POA: Diagnosis present

## 2015-03-13 DIAGNOSIS — Z7982 Long term (current) use of aspirin: Secondary | ICD-10-CM | POA: Diagnosis not present

## 2015-03-13 DIAGNOSIS — Z7902 Long term (current) use of antithrombotics/antiplatelets: Secondary | ICD-10-CM | POA: Insufficient documentation

## 2015-03-13 DIAGNOSIS — E785 Hyperlipidemia, unspecified: Secondary | ICD-10-CM | POA: Insufficient documentation

## 2015-03-13 DIAGNOSIS — Z9889 Other specified postprocedural states: Secondary | ICD-10-CM | POA: Insufficient documentation

## 2015-03-13 DIAGNOSIS — I129 Hypertensive chronic kidney disease with stage 1 through stage 4 chronic kidney disease, or unspecified chronic kidney disease: Secondary | ICD-10-CM | POA: Insufficient documentation

## 2015-03-13 DIAGNOSIS — Z9861 Coronary angioplasty status: Secondary | ICD-10-CM | POA: Diagnosis not present

## 2015-03-13 DIAGNOSIS — R079 Chest pain, unspecified: Secondary | ICD-10-CM | POA: Diagnosis present

## 2015-03-13 DIAGNOSIS — Z7901 Long term (current) use of anticoagulants: Secondary | ICD-10-CM | POA: Diagnosis not present

## 2015-03-13 DIAGNOSIS — Z79899 Other long term (current) drug therapy: Secondary | ICD-10-CM | POA: Insufficient documentation

## 2015-03-13 DIAGNOSIS — E119 Type 2 diabetes mellitus without complications: Secondary | ICD-10-CM | POA: Insufficient documentation

## 2015-03-13 DIAGNOSIS — I480 Paroxysmal atrial fibrillation: Secondary | ICD-10-CM | POA: Insufficient documentation

## 2015-03-13 DIAGNOSIS — I252 Old myocardial infarction: Secondary | ICD-10-CM | POA: Insufficient documentation

## 2015-03-13 DIAGNOSIS — I25118 Atherosclerotic heart disease of native coronary artery with other forms of angina pectoris: Secondary | ICD-10-CM | POA: Diagnosis not present

## 2015-03-13 DIAGNOSIS — K219 Gastro-esophageal reflux disease without esophagitis: Secondary | ICD-10-CM | POA: Diagnosis not present

## 2015-03-13 DIAGNOSIS — I2510 Atherosclerotic heart disease of native coronary artery without angina pectoris: Secondary | ICD-10-CM | POA: Diagnosis present

## 2015-03-13 DIAGNOSIS — I447 Left bundle-branch block, unspecified: Secondary | ICD-10-CM | POA: Diagnosis present

## 2015-03-13 DIAGNOSIS — I25119 Atherosclerotic heart disease of native coronary artery with unspecified angina pectoris: Secondary | ICD-10-CM | POA: Diagnosis not present

## 2015-03-13 DIAGNOSIS — R0789 Other chest pain: Secondary | ICD-10-CM | POA: Diagnosis not present

## 2015-03-13 DIAGNOSIS — I2089 Other forms of angina pectoris: Secondary | ICD-10-CM | POA: Diagnosis present

## 2015-03-13 DIAGNOSIS — N183 Chronic kidney disease, stage 3 (moderate): Secondary | ICD-10-CM | POA: Diagnosis not present

## 2015-03-13 LAB — BASIC METABOLIC PANEL
Anion gap: 9 (ref 5–15)
BUN: 47 mg/dL — AB (ref 6–20)
CHLORIDE: 107 mmol/L (ref 101–111)
CO2: 21 mmol/L — AB (ref 22–32)
CREATININE: 2.14 mg/dL — AB (ref 0.61–1.24)
Calcium: 9.1 mg/dL (ref 8.9–10.3)
GFR calc non Af Amer: 27 mL/min — ABNORMAL LOW (ref >60)
GFR, EST AFRICAN AMERICAN: 32 mL/min — AB (ref >60)
Glucose, Bld: 229 mg/dL — ABNORMAL HIGH (ref 65–99)
POTASSIUM: 4.6 mmol/L (ref 3.5–5.1)
Sodium: 137 mmol/L (ref 135–145)

## 2015-03-13 LAB — I-STAT TROPONIN, ED
TROPONIN I, POC: 0.01 ng/mL (ref 0.00–0.08)
Troponin i, poc: 0.02 ng/mL (ref 0.00–0.08)

## 2015-03-13 LAB — BRAIN NATRIURETIC PEPTIDE: B NATRIURETIC PEPTIDE 5: 757.4 pg/mL — AB (ref 0.0–100.0)

## 2015-03-13 LAB — CBC
HEMATOCRIT: 29.8 % — AB (ref 39.0–52.0)
Hemoglobin: 9.7 g/dL — ABNORMAL LOW (ref 13.0–17.0)
MCH: 30.2 pg (ref 26.0–34.0)
MCHC: 32.6 g/dL (ref 30.0–36.0)
MCV: 92.8 fL (ref 78.0–100.0)
PLATELETS: 158 10*3/uL (ref 150–400)
RBC: 3.21 MIL/uL — AB (ref 4.22–5.81)
RDW: 13.6 % (ref 11.5–15.5)
WBC: 5.5 10*3/uL (ref 4.0–10.5)

## 2015-03-13 MED ORDER — ASPIRIN 81 MG PO CHEW
243.0000 mg | CHEWABLE_TABLET | Freq: Once | ORAL | Status: AC
  Administered 2015-03-13: 243 mg via ORAL
  Filled 2015-03-13: qty 3

## 2015-03-13 MED ORDER — NITROGLYCERIN 0.4 MG SL SUBL
0.4000 mg | SUBLINGUAL_TABLET | SUBLINGUAL | Status: DC | PRN
  Administered 2015-03-13 (×2): 0.4 mg via SUBLINGUAL
  Filled 2015-03-13: qty 1

## 2015-03-13 NOTE — ED Notes (Signed)
Pt c/o chest pain ongoing for several days. Pt took a nitro and that relieved his chest pain.

## 2015-03-13 NOTE — ED Notes (Signed)
Pt verbalized understanding of d/c instructionsand follow-up care. No further questions/concerns, VSS, assisted to lobby in wheelchair. 

## 2015-03-13 NOTE — ED Notes (Signed)
Pt transported to xray 

## 2015-03-13 NOTE — Consult Note (Signed)
Admit date: 03/13/2015 Referring Physician  Dr. Adela Lank Primary Physician [name], MD Primary Cardiologist  Dr. Allyson Sabal Reason for Consultation  Chest pain  HPI: 81 year old male, followed by Dr Allyson Sabal with a history of CAD, status post coronary artery bypass grafting in 1994. He also has hypertension, hyperlipidemia, insulin-dependent diabetes, CRI, and peripheral vascular disease. He had right carotid endarterectomy back in 2009 and has been followed by duplex ultrasound here in our office. His creatinines run in the 2 range, he is followed by Dr Abel Presto. He was cath'd by Dr. Tresa Endo March 02, 2011, with a failed attempt at native circumflex recanalization complicated by fractured wire tip. His LIMA to his LAD was patent, as was the vein to a diagonal branch and a vein to the PDA and PLA sequentially. The vein to the marginal branch was occluded. His EF was 35% to 40% with anteroapical hypokinesia. His Myoview performed March 30, 2011, showed mild lateral ischemia explainable by his anatomy.  He presents today to the emergency room with an episode of chest discomfort, no radiation, at rest earlier this morning which was relieved with one nitroglycerin. He usually does not get anginal symptoms and this concerned him.  Last week, he decreased his Lasix to once a day because of chronic kidney disease. Last night he did feel some shortness of breath perhaps orthopnea and this concerned him as well. This proceeded his chest discomfort.  Since being here in the emergency room, he has been pain-free, no signs of shortness of breath. He is here with his wife. He is quite comfortable currently.  He tells me that the cost of Eliquis has been troublesome for him, $2100 for a three-month supply.     PMH:   Past Medical History  Diagnosis Date  . Angina at rest [facility]) 02/27/2011  . CAD (coronary artery disease) 02/27/2011    Catheterized on 02/28/11, graft supplying marginal vessel was  occluded, after 48 hours of hydration, a repeat cath was performed in an attempt at PCI 03/02/11 cath had an unsuccessful attempt at PCI to an occluded calcified circumflex vessel, Residual distal tip of the [facility] PT moderate support wire at the subtotal occlusion. No change in the subtotal occlusion,improved retrograde collateralization  . LV dysfunction 02/27/2011  . CKD (chronic kidney disease) stage 3, GFR 30-59 ml/min 02/27/2011  . LBBB (left bundle branch block) 02/27/2011  . Hypertension   . Hyperlipemia 02/27/2011  . Diabetes mellitus 02/27/2011    insulin dependent  . GERD (gastroesophageal reflux disease)   . CHF (congestive heart failure) (HCC)   . Blood transfusion     no reaction to transfusion per patient  . Arthritis   . Myocardial infarction (HCC)   . PVD (peripheral vascular disease) (HCC)     Carotid Angiography was performed in 2007, no treatment delivered at that time. Right carotid endarterectomy in 2009, being followed by duplex ultrasound. Doppler remains stable.  [name] CAD (coronary artery disease)     PTCA of the right coronary artery in Feb. 1987, July 1987, and June 1990. Circumflex PTCA in July 1993, followed by a RCA, PTCA in Sept. 1994. CABG in 1994x5 with a LIMA to the LAD, vein graft to the first diagonal, vein graft to the marginal and left circumflex coronary artery, and sequential graft to the PDA and PLA branches of the RCA  . HTN (hypertension)     Renal dopplers have been performed on a semi annual basis. Left renal artery demonstrated vessel narrowing with  elevated velocities consistent with a 1-59% diameter reduction.   . carotid doppler     Left ECA : The proximal most aspect of the left external carotid artery demonstrates a peak systolic velocity of 493.9 cm/s. This is consistent with a 70-99% significant stenosis correlated with a 2007 angiogram.  . Paroxysmal atrial fibrillation (HCC)     PSH:   Past Surgical History  Procedure Laterality Date  .  Coronary artery bypass graft  1994  . Spinal fusion    . Back surgery    . Carotid endarterectomy  2009  . Myoview scan  03/30/2011  . Left heart catheterization with coronary/graft angiogram N/A 02/28/2011    Procedure: LEFT HEART CATHETERIZATION WITH Isabel Caprice;  Surgeon: Lennette Bihari, MD;  Location: [facility] CATH LAB;  Service: Cardiovascular;  Laterality: N/A;  . Percutaneous coronary stent intervention (pci-s) N/A 03/02/2011    Procedure: PERCUTANEOUS CORONARY STENT INTERVENTION (PCI-S);  Surgeon: Lennette Bihari, MD;  Location: [facility] CATH LAB;  Service: Cardiovascular;  Laterality: N/A;   Allergies:  Lisinopril Prior to Admit Meds:   Prior to Admission medications   Medication Sig Start Date End Date Taking? Authorizing Provider  acetaminophen (TYLENOL) 325 MG tablet Take 650 mg by mouth every 6 (six) hours as needed for pain.   Yes Historical Provider, MD  amLODipine (NORVASC) 10 MG tablet Take 10 mg by mouth daily.     Yes Historical Provider, MD  apixaban (ELIQUIS) 2.5 MG TABS tablet Take 2 tablets (5 mg total) by mouth 2 (two) times daily. Patient taking differently: Take 2.5 mg by mouth 2 (two) times daily.  01/20/15  Yes [name], MD  aspirin 81 MG tablet Take 81 mg by mouth daily.   Yes Historical Provider, MD  carvedilol (COREG) 25 MG tablet TAKE 1&1/2 TABLETS BY MOUTH 2 TIMES DAILY WITH MEALS 01/21/15  Yes [name], MD  cholecalciferol (VITAMIN D) 1000 UNITS tablet Take 1,000 Units by mouth daily.   Yes Historical Provider, MD  diphenhydrAMINE (BENADRYL) 25 MG tablet Take 25 mg by mouth daily.   Yes Historical Provider, MD  ferrous sulfate 325 (65 FE) MG tablet Take 325 mg by mouth daily with breakfast.   Yes Historical Provider, MD  FOLIC ACID PO Take 600 mcg by mouth daily.   Yes Historical Provider, MD  furosemide (LASIX) 40 MG tablet Take 40 mg twice a day on Tues-Thurs-Sat take 40 mg daily on other days Patient taking differently: 20 mg. Take 20 mg once  daily 12/14/14  Yes Luke K Kilroy, PA-C  glipiZIDE (GLUCOTROL) 10 MG tablet Take 20 mg by mouth 2 (two) times daily.    Yes Historical Provider, MD  Glucosamine-Chondroitin (MOVE FREE PO) Take 1 tablet by mouth 2 (two) times daily.    Yes Historical Provider, MD  hydrALAZINE (APRESOLINE) 50 MG tablet Take 1 tablet (50 mg total) by mouth 2 (two) times daily. 11/20/13  Yes [name], MD  isosorbide mononitrate (IMDUR) 60 MG 24 hr tablet Take 60 mg by mouth daily.   Yes Historical Provider, MD  Multiple Vitamins-Minerals (MULTIVITAMINS THER. W/MINERALS) TABS Take 1 tablet by mouth daily.     Yes Historical Provider, MD  nitroGLYCERIN (NITROSTAT) 0.4 MG SL tablet Place 0.4 mg under the tongue every 5 (five) minutes as needed for chest pain.   Yes Historical Provider, MD  omeprazole (PRILOSEC) 20 MG capsule Take 20 mg by mouth 2 (two) times daily.    Yes Historical Provider, MD  pravastatin (PRAVACHOL) 40 MG tablet Take 40 mg by mouth at bedtime.     Yes Historical Provider, MD  loratadine (CLARITIN) 10 MG tablet Take 10 mg by mouth daily. Reported on 03/13/2015    Historical Provider, MD   Fam HX:    Family History  Problem Relation Age of Onset  . Cancer Mother     unknown to pt  . Cancer Father     pancreas  . Cancer Paternal Grandfather     prostate   Social HX:    Social History   Social History  . Marital Status: Married    Spouse Name: N/A  . Number of Children: N/A  . Years of Education: N/A   Occupational History  . Not on file.   Social History Main Topics  . Smoking status: Never Smoker   . Smokeless tobacco: Never Used     Comment: quit smoking in the 1960's  . Alcohol Use: No  . Drug Use: No  . Sexual Activity: Yes   Other Topics Concern  . Not on file   Social History Narrative     ROS:  Denies any fevers, chills, syncope, bleeding. All 11 ROS were addressed and are negative except what is stated in the HPI   Physical Exam: Blood pressure 139/66, pulse  56, temperature 97.7 F (36.5 C), temperature source Oral, resp. rate 20, height 5' 11.5" (1.816 m), weight 195 lb (88.451 kg), SpO2 91 %.   General: Well developed, well nourished, in no acute distress, laying flat with blankets over his head because of cold conditions in the emergency room Head: Eyes PERRLA, No xanthomas.   Normal cephalic and atramatic  Lungs:   Minimal crackles at bases, chronic, normal respiratory effort. Heart:   HRRR S1 S2 Pulses are 2+ & equal. No significant murmur, rubs, gallops.  No carotid bruit. No JVD.  No abdominal bruits.  Abdomen: Bowel sounds are positive, abdomen soft and non-tender without masses. No hepatosplenomegaly. Msk:  Back normal. Normal strength and tone for age. Extremities:  No clubbing, cyanosis, 2+ bilateral lower extremity edema.  DP +1. Wearing compression hose Neuro: Alert and oriented X 3, non-focal, MAE x 4 GU: Deferred Rectal: Deferred Psych:  Good affect, responds appropriately      Labs: Lab Results  Component Value Date   WBC 5.5 03/13/2015   HGB 9.7* 03/13/2015   HCT 29.8* 03/13/2015   MCV 92.8 03/13/2015   PLT 158 03/13/2015     Recent Labs Lab 03/13/15 1114  NA 137  K 4.6  CL 107  CO2 21*  BUN 47*  CREATININE 2.14*  CALCIUM 9.1  GLUCOSE 229*   No results for input(s): CKTOTAL, CKMB, TROPONINI in the last 72 hours. Lab Results  Component Value Date   CHOL  07/29/2008    103        ATP III CLASSIFICATION:  <200     mg/dL   Desirable  409-811  mg/dL   Borderline High  >=914    mg/dL   High          HDL 29* 07/29/2008   LDLCALC  07/29/2008    51        Total Cholesterol/HDL:CHD Risk Coronary Heart Disease Risk Table                     Men   Women  1/2 Average Risk   3.4   3.3  Average Risk  5.0   4.4  2 X Average Risk   9.6   7.1  3 X Average Risk  23.4   11.0        Use the calculated Patient Ratio above and the CHD Risk Table to determine the patient's CHD Risk.        ATP III  CLASSIFICATION (LDL):  <100     mg/dL   Optimal  [id number]  mg/dL   Near or Above                    Optimal  130-159  mg/dL   Borderline  [id number]  mg/dL   High  >425>190     mg/dL   Very High   TRIG [id number] 07/29/2008   No results found for: DDIMER   Radiology:  Dg Chest 2 View  03/13/2015  CLINICAL DATA:  Chest pain for several days and shortness of breath last night. History of atrial fibrillation, CHF, diabetes and hypertension. History of 2 myocardial infarctions. Former smoker. EXAM: CHEST  2 VIEW COMPARISON:  Chest x-ray dated 12/06/2014. FINDINGS: Again noted are surgical changes of median sternotomy for CABG. Mild cardiomegaly is stable. Overall cardiomediastinal silhouette is stable in size and configuration. Chronic bronchitic changes and at least mild chronic interstitial thickening throughout both lungs, unchanged. No new airspace opacity. No pleural effusion. No pneumothorax. No acute osseous abnormality. IMPRESSION: Stable chest x-ray.  No acute findings. Electronically Signed   By: Bary RichardStan  Maynard M.D.   On: 03/13/2015 12:09   Personally viewed.  EKG:  03/13/15-sinus rhythm, 63 bpm with left bundle branch block like morphology. Nonspecific ST-T wave changes. No significant change from prior Personally viewed.   Echocardiogram 08/03/14: - Left ventricle: Septal , apical and inferior wall hypokinesis. The cavity size was moderately dilated. Wall thickness was normal. Systolic function was moderately reduced. The estimatedejection fraction was in the range of 35% to 40%. - Aortic valve: There was trivial regurgitation. - Mitral valve: There was mild regurgitation. - Left atrium: The atrium was moderately dilated. - Right atrium: The atrium was mildly dilated.   ASSESSMENT/PLAN:    81 year old male with extensive cardiac history post bypass with single episode of chest discomfort relieved with nitroglycerin and recent shortness of breath now resolved, chronic systolic heart failure  EF 35-40%.  Angina - Appears stable. Isolated episode relieved with nitroglycerin. Extensive cardiac history and anatomy reviewed. - Continue with current anti-anginal medications including beta blocker, isosorbide. - Very comfortable currently. Troponin is normal. - I discussed with Dr. Adela LankFloyd, we will check 1 more troponin and if normal I am comfortable with discharge from the emergency room. - He has close follow-up on Thursday in clinic.  Chronic systolic heart failure - With his recent reduction in Lasix, this may have contributed to some of his shortness of breath last night. - Currently he is quite comfortable laying flat. - Chest x-ray shows no evidence of heart failure. - No significant JVD on exam. - He does have 1-2+ edema bilateral lower extremities with his compression hose which is fairly chronic for him. Also mild crackles on exam which was noted in previous visits as well. - I have asked him to resume his Lasix twice a day until Thursday. - Check basic metabolic profile at that time. - He understands the correlation with over diuresis and worsening renal function.  Chronic kidney disease stage IV - Creatinine in the 2.0 range. - I'm increasing his Lasix back to 40 mg twice a  day. - Continue to monitor.  Paroxysmal atrial fibrillation - Chronic anticoagulation - Cost has been an issue. - Continue  Coronary artery disease with anginal symptoms - Extensive coronary anatomy reviewed.  Mild anemia - Hemoglobin 9.7 - Has been 11.0, 11.3, 10.7, 11.4 during last checks. - No overt signs of bleeding. On anticoagulation. - This could contribute to shortness of breath as well. - Repeat CBC soon. - Consider further evaluation in future.  If symptoms become more worrisome, he knows to return to the emergency room. I discussed with he and his wife. Comfortable with discharge.  Donato Schultz, MD  03/13/2015  2:11 PM

## 2015-03-13 NOTE — ED Provider Notes (Signed)
CSN: [id number]     Arrival date & time 03/13/15  1103 History   First MD Initiated Contact with Patient 03/13/15 1112     Chief Complaint  Patient presents with  . Chest Pain     (Consider location/radiation/quality/duration/timing/severity/associated sxs/prior Treatment) Patient is a 81 y.o. male presenting with chest pain. The history is provided by the patient.  Chest Pain Pain location:  L chest Pain quality: burning   Pain radiates to:  Does not radiate Pain radiates to the back: no   Pain severity:  Severe Onset quality:  Gradual Duration:  1 day Timing:  Constant Progression:  Resolved Chronicity:  Recurrent (feels like prior MI) Relieved by:  Nitroglycerin Worsened by:  Nothing tried Ineffective treatments:  None tried Associated symptoms: shortness of breath (was severe, patient unable to sleep)   Associated symptoms: no abdominal pain, no fever, no headache, no palpitations and not vomiting   Risk factors: coronary artery disease, high cholesterol, hypertension and male sex     81 yo M  With a chief complaint chest pain. This started yesterday was worsening overnight. Patient states it was associated with a severe shortness of breath. Patient felt like he was unable to do anything at home. This morning the symptoms persisted patient took one nitroglycerin and had complete relief of the pain. Patient is concerned because this felt like the last time he had a MI. Patient had been recently concerned about acute kidney injury that was thought to be secondary to increased Lasix use. Patient had cut his Lasix down to 1 tablet a day. Denies any worsening lower extremity edema orthopnea or PND. Patient feels this completely unrelated to his heart failure. He thinks this is significantly associated with his cardiac disease.  Past Medical History  Diagnosis Date  . Angina at rest [facility]) 02/27/2011  . CAD (coronary artery disease) 02/27/2011    Catheterized on 02/28/11, graft  supplying marginal vessel was occluded, after 48 hours of hydration, a repeat cath was performed in an attempt at PCI 03/02/11 cath had an unsuccessful attempt at PCI to an occluded calcified circumflex vessel, Residual distal tip of the [employer] PT moderate support wire at the subtotal occlusion. No change in the subtotal occlusion,improved retrograde collateralization  . LV dysfunction 02/27/2011  . CKD (chronic kidney disease) stage 3, GFR 30-59 ml/min 02/27/2011  . LBBB (left bundle branch block) 02/27/2011  . Hypertension   . Hyperlipemia 02/27/2011  . Diabetes mellitus 02/27/2011    insulin dependent  . GERD (gastroesophageal reflux disease)   . CHF (congestive heart failure) (HCC)   . Blood transfusion     no reaction to transfusion per patient  . Arthritis   . Myocardial infarction (HCC)   . PVD (peripheral vascular disease) (HCC)     Carotid Angiography was performed in 2007, no treatment delivered at that time. Right carotid endarterectomy in 2009, being followed by duplex ultrasound. Doppler remains stable.  [name] CAD (coronary artery disease)     PTCA of the right coronary artery in Feb. 1987, July 1987, and June 1990. Circumflex PTCA in July 1993, followed by a RCA, PTCA in Sept. 1994. CABG in 1994x5 with a LIMA to the LAD, vein graft to the first diagonal, vein graft to the marginal and left circumflex coronary artery, and sequential graft to the PDA and PLA branches of the RCA  . HTN (hypertension)     Renal dopplers have been performed on a semi annual basis. Left renal artery demonstrated vessel  narrowing with elevated velocities consistent with a 1-59% diameter reduction.   . carotid doppler     Left ECA : The proximal most aspect of the left external carotid artery demonstrates a peak systolic velocity of 493.9 cm/s. This is consistent with a 70-99% significant stenosis correlated with a 2007 angiogram.  . Paroxysmal atrial fibrillation [facility])    Past Surgical History  Procedure  Laterality Date  . Coronary artery bypass graft  1994  . Spinal fusion    . Back surgery    . Carotid endarterectomy  2009  . Myoview scan  03/30/2011  . Left heart catheterization with coronary/graft angiogram N/A 02/28/2011    Procedure: LEFT HEART CATHETERIZATION WITH Isabel Caprice;  Surgeon: Lennette Bihari, MD;  Location: [facility] CATH LAB;  Service: Cardiovascular;  Laterality: N/A;  . Percutaneous coronary stent intervention (pci-s) N/A 03/02/2011    Procedure: PERCUTANEOUS CORONARY STENT INTERVENTION (PCI-S);  Surgeon: Lennette Bihari, MD;  Location: [facility] CATH LAB;  Service: Cardiovascular;  Laterality: N/A;   Family History  Problem Relation Age of Onset  . Cancer Mother     unknown to pt  . Cancer Father     pancreas  . Cancer Paternal Grandfather     prostate   Social History  Substance Use Topics  . Smoking status: Never Smoker   . Smokeless tobacco: Never Used     Comment: quit smoking in the 1960's  . Alcohol Use: No    Review of Systems  Constitutional: Negative for fever and chills.  HENT: Negative for congestion and facial swelling.   Eyes: Negative for discharge and visual disturbance.  Respiratory: Positive for shortness of breath (was severe, patient unable to sleep).   Cardiovascular: Positive for chest pain. Negative for palpitations.  Gastrointestinal: Negative for vomiting, abdominal pain and diarrhea.  Musculoskeletal: Negative for myalgias and arthralgias.  Skin: Negative for color change and rash.  Neurological: Negative for tremors, syncope and headaches.  Psychiatric/Behavioral: Negative for confusion and dysphoric mood.      Allergies  Lisinopril  Home Medications   Prior to Admission medications   Medication Sig Start Date End Date Taking? Authorizing Provider  acetaminophen (TYLENOL) 325 MG tablet Take 650 mg by mouth every 6 (six) hours as needed for pain.   Yes Historical Provider, MD  amLODipine (NORVASC) 10 MG tablet Take 10 mg by  mouth daily.     Yes Historical Provider, MD  apixaban (ELIQUIS) 2.5 MG TABS tablet Take 2 tablets (5 mg total) by mouth 2 (two) times daily. Patient taking differently: Take 2.5 mg by mouth 2 (two) times daily.  01/20/15  Yes [name], MD  aspirin 81 MG tablet Take 81 mg by mouth daily.   Yes Historical Provider, MD  carvedilol (COREG) 25 MG tablet TAKE 1&1/2 TABLETS BY MOUTH 2 TIMES DAILY WITH MEALS 01/21/15  Yes [name], MD  cholecalciferol (VITAMIN D) 1000 UNITS tablet Take 1,000 Units by mouth daily.   Yes Historical Provider, MD  diphenhydrAMINE (BENADRYL) 25 MG tablet Take 25 mg by mouth daily.   Yes Historical Provider, MD  ferrous sulfate 325 (65 FE) MG tablet Take 325 mg by mouth daily with breakfast.   Yes Historical Provider, MD  FOLIC ACID PO Take 600 mcg by mouth daily.   Yes Historical Provider, MD  furosemide (LASIX) 40 MG tablet Take 40 mg twice a day on Tues-Thurs-Sat take 40 mg daily on other days Patient taking differently: 20 mg. Take 20 mg  once daily 12/14/14  Yes Luke K Kilroy, PA-C  glipiZIDE (GLUCOTROL) 10 MG tablet Take 20 mg by mouth 2 (two) times daily.    Yes Historical Provider, MD  Glucosamine-Chondroitin (MOVE FREE PO) Take 1 tablet by mouth 2 (two) times daily.    Yes Historical Provider, MD  hydrALAZINE (APRESOLINE) 50 MG tablet Take 1 tablet (50 mg total) by mouth 2 (two) times daily. 11/20/13  Yes [name], MD  isosorbide mononitrate (IMDUR) 60 MG 24 hr tablet Take 60 mg by mouth daily.   Yes Historical Provider, MD  Multiple Vitamins-Minerals (MULTIVITAMINS THER. W/MINERALS) TABS Take 1 tablet by mouth daily.     Yes Historical Provider, MD  nitroGLYCERIN (NITROSTAT) 0.4 MG SL tablet Place 0.4 mg under the tongue every 5 (five) minutes as needed for chest pain.   Yes Historical Provider, MD  omeprazole (PRILOSEC) 20 MG capsule Take 20 mg by mouth 2 (two) times daily.    Yes Historical Provider, MD  pravastatin (PRAVACHOL) 40 MG tablet Take  40 mg by mouth at bedtime.     Yes Historical Provider, MD  loratadine (CLARITIN) 10 MG tablet Take 10 mg by mouth daily. Reported on 03/13/2015    Historical Provider, MD   BP 149/73 mmHg  Pulse 83  Temp(Src) 98.5 F (36.9 C) (Oral)  Resp 18  Ht 5' 11.5" (1.816 m)  Wt 195 lb (88.451 kg)  BMI 26.82 kg/m2  SpO2 91% Physical Exam  Constitutional: He is oriented to person, place, and time. He appears well-developed and well-nourished.  HENT:  Head: Normocephalic and atraumatic.  Eyes: EOM are normal. Pupils are equal, round, and reactive to light.  Neck: Normal range of motion. Neck supple. No JVD present.  Cardiovascular: Normal rate, regular rhythm and intact distal pulses.  Exam reveals no gallop and no friction rub.   No murmur heard. jvd to mid neck   Pulmonary/Chest: No respiratory distress. He has no wheezes. He has rales (bases).  Abdominal: He exhibits no distension. There is no tenderness. There is no rebound and no guarding.  Musculoskeletal: Normal range of motion. He exhibits edema (trace bilateral).  Neurological: He is alert and oriented to person, place, and time.  Skin: No rash noted. No pallor.  Psychiatric: He has a normal mood and affect. His behavior is normal.  Nursing note and vitals reviewed.   ED Course  Procedures (including critical care time) Labs Review Labs Reviewed  BASIC METABOLIC PANEL - Abnormal; Notable for the following:    CO2 21 (*)    Glucose, Bld 229 (*)    BUN 47 (*)    Creatinine, Ser 2.14 (*)    GFR calc non Af Amer 27 (*)    GFR calc Af Amer 32 (*)    All other components within normal limits  CBC - Abnormal; Notable for the following:    RBC 3.21 (*)    Hemoglobin 9.7 (*)    HCT 29.8 (*)    All other components within normal limits  BRAIN NATRIURETIC PEPTIDE - Abnormal; Notable for the following:    B Natriuretic Peptide 757.4 (*)    All other components within normal limits  I-STAT TROPOININ, ED  I-STAT TROPOININ, ED     Imaging Review Dg Chest 2 View  03/13/2015  CLINICAL DATA:  Chest pain for several days and shortness of breath last night. History of atrial fibrillation, CHF, diabetes and hypertension. History of 2 myocardial infarctions. Former smoker. EXAM: CHEST  2 VIEW  COMPARISON:  Chest x-ray dated 12/06/2014. FINDINGS: Again noted are surgical changes of median sternotomy for CABG. Mild cardiomegaly is stable. Overall cardiomediastinal silhouette is stable in size and configuration. Chronic bronchitic changes and at least mild chronic interstitial thickening throughout both lungs, unchanged. No new airspace opacity. No pleural effusion. No pneumothorax. No acute osseous abnormality. IMPRESSION: Stable chest x-ray.  No acute findings. Electronically Signed   By: Bary RichardStan  Maynard M.D.   On: 03/13/2015 12:09   I have personally reviewed and evaluated these images and lab results as part of my medical decision-making.   EKG Interpretation   Date/Time:  Sunday March 13 2015 11:04:50 EST Ventricular Rate:  63 PR Interval:    QRS Duration: 156 QT Interval:  462 QTC Calculation: 472 R Axis:   95 Text Interpretation:  Wide QRS rhythm Rightward axis Non-specific  intra-ventricular conduction block Abnormal ECG Confirmed by Fayrene FearingJAMES  MD,  MARK ([id number]) on 03/13/2015 11:12:51 AM      MDM   Final diagnoses:  Cardiac chest pain    81 yo M  With a chief complaint of chest pain. This is associated with severe shortness of breath. Patient has a history of multiple MIs in the past has had 13  cardiac caths. He feels that this is related to his cardiac disease. EKG unchanged chest x-ray similar to prior. Initial troponin negative. Will discuss with cardiology.   evaluated that that by Dr. Anne FuSkains, feel likely due to decrease in lasix worsening CHF.  Will have follow up early next week.  Delay on obtaining second trop, patient found to have recurrent pain.  Discussed with Dr. Anne FuSkains, feels safe with d/c.    4:24 PM:  I have discussed the diagnosis/risks/treatment options with the patient and family and believe the pt to be eligible for discharge home to follow-up with Cards. We also discussed returning to the ED immediately if new or worsening sx occur. We discussed the sx which are most concerning (e.g., sudden worsening pain, fever, inability to tolerate by mouth) that necessitate immediate return. Medications administered to the patient during their visit and any new prescriptions provided to the patient are listed below.  Medications given during this visit Medications  nitroGLYCERIN (NITROSTAT) SL tablet 0.4 mg (0.4 mg Sublingual Given 03/13/15 1617)  aspirin chewable tablet 243 mg (243 mg Oral Given 03/13/15 1151)    New Prescriptions   No medications on file    The patient appears reasonably screen and/or stabilized for discharge and I doubt any other medical condition or other [facility] requiring further screening, evaluation, or treatment in the ED at this time prior to discharge.    Melene Planan Kerstie Agent, DO 03/13/15 1624

## 2015-03-13 NOTE — Discharge Instructions (Signed)
Take your lasix twice a day until you see Dr. Allyson Sabal.  Return for pain not controlled with nitro.  Nonspecific Chest Pain  Chest pain can be caused by many different conditions. There is always a chance that your pain could be related to something serious, such as a heart attack or a blood clot in your lungs. Chest pain can also be caused by conditions that are not life-threatening. If you have chest pain, it is very important to follow up with your health care provider. CAUSES  Chest pain can be caused by:  Heartburn.  Pneumonia or bronchitis.  Anxiety or stress.  Inflammation around your heart (pericarditis) or lung (pleuritis or pleurisy).  A blood clot in your lung.  A collapsed lung (pneumothorax). It can develop suddenly on its own (spontaneous pneumothorax) or from trauma to the chest.  Shingles infection (varicella-zoster virus).  Heart attack.  Damage to the bones, muscles, and cartilage that make up your chest wall. This can include:  Bruised bones due to injury.  Strained muscles or cartilage due to frequent or repeated coughing or overwork.  Fracture to one or more ribs.  Sore cartilage due to inflammation (costochondritis). RISK FACTORS  Risk factors for chest pain may include:  Activities that increase your risk for trauma or injury to your chest.  Respiratory infections or conditions that cause frequent coughing.  Medical conditions or overeating that can cause heartburn.  Heart disease or family history of heart disease.  Conditions or health behaviors that increase your risk of developing a blood clot.  Having had chicken pox (varicella zoster). SIGNS AND SYMPTOMS Chest pain can feel like:  Burning or tingling on the surface of your chest or deep in your chest.  Crushing, pressure, aching, or squeezing pain.  Dull or sharp pain that is worse when you move, cough, or take a deep breath.  Pain that is also felt in your back, neck, shoulder, or arm,  or pain that spreads to any of these areas. Your chest pain may come and go, or it may stay constant. DIAGNOSIS Lab tests or other studies may be needed to find the cause of your pain. Your health care provider may have you take a test called an ambulatory ECG (electrocardiogram). An ECG records your heartbeat patterns at the time the test is performed. You may also have other tests, such as:  Transthoracic echocardiogram (TTE). During echocardiography, sound waves are used to create a picture of all of the heart structures and to look at how blood flows through your heart.  Transesophageal echocardiogram (TEE).This is a more advanced imaging test that obtains images from inside your body. It allows your health care provider to see your heart in finer detail.  Cardiac monitoring. This allows your health care provider to monitor your heart rate and rhythm in real time.  Holter monitor. This is a portable device that records your heartbeat and can help to diagnose abnormal heartbeats. It allows your health care provider to track your heart activity for several days, if needed.  Stress tests. These can be done through exercise or by taking medicine that makes your heart beat more quickly.  Blood tests.  Imaging tests. TREATMENT  Your treatment depends on what is causing your chest pain. Treatment may include:  Medicines. These may include:  Acid blockers for heartburn.  Anti-inflammatory medicine.  Pain medicine for inflammatory conditions.  Antibiotic medicine, if an infection is present.  Medicines to dissolve blood clots.  Medicines to treat coronary  artery disease.  Supportive care for conditions that do not require medicines. This may include:  Resting.  Applying heat or cold packs to injured areas.  Limiting activities until pain decreases. HOME CARE INSTRUCTIONS  If you were prescribed an antibiotic medicine, finish it all even if you start to feel better.  Avoid  any activities that bring on chest pain.  Do not use any tobacco products, including cigarettes, chewing tobacco, or electronic cigarettes. If you need help quitting, ask your health care provider.  Do not drink alcohol.  Take medicines only as directed by your health care provider.  Keep all follow-up visits as directed by your health care provider. This is important. This includes any further testing if your chest pain does not go away.  If heartburn is the cause for your chest pain, you may be told to keep your head raised (elevated) while sleeping. This reduces the chance that acid will go from your stomach into your esophagus.  Make lifestyle changes as directed by your health care provider. These may include:  Getting regular exercise. Ask your health care provider to suggest some activities that are safe for you.  Eating a heart-healthy diet. A registered dietitian can help you to learn healthy eating options.  Maintaining a healthy weight.  Managing diabetes, if necessary.  Reducing stress. SEEK MEDICAL CARE IF:  Your chest pain does not go away after treatment.  You have a rash with blisters on your chest.  You have a fever. SEEK IMMEDIATE MEDICAL CARE IF:   Your chest pain is worse.  You have an increasing cough, or you cough up blood.  You have severe abdominal pain.  You have severe weakness.  You faint.  You have chills.  You have sudden, unexplained chest discomfort.  You have sudden, unexplained discomfort in your arms, back, neck, or jaw.  You have shortness of breath at any time.  You suddenly start to sweat, or your skin gets clammy.  You feel nauseous or you vomit.  You suddenly feel light-headed or dizzy.  Your heart begins to beat quickly, or it feels like it is skipping beats. These symptoms may represent a serious problem that is an emergency. Do not wait to see if the symptoms will go away. Get medical help right away. Call your local  emergency services (911 in the [place] Do not drive yourself to the hospital.   This information is not intended to replace advice given to you by your health care provider. Make sure you discuss any questions you have with your health care provider.   Document Released: 12/20/2004 Document Revised: 04/02/2014 Document Reviewed: 10/16/2013 Elsevier Interactive Patient Education [employer]2016 [employer] Inc.

## 2015-03-15 ENCOUNTER — Ambulatory Visit (INDEPENDENT_AMBULATORY_CARE_PROVIDER_SITE_OTHER): Payer: Medicare Other | Admitting: Cardiovascular Disease

## 2015-03-15 ENCOUNTER — Encounter: Payer: Self-pay | Admitting: Cardiovascular Disease

## 2015-03-15 VITALS — BP 116/64 | HR 55 | Ht 70.0 in | Wt 195.0 lb

## 2015-03-15 DIAGNOSIS — I2583 Coronary atherosclerosis due to lipid rich plaque: Secondary | ICD-10-CM

## 2015-03-15 DIAGNOSIS — I255 Ischemic cardiomyopathy: Secondary | ICD-10-CM

## 2015-03-15 DIAGNOSIS — I480 Paroxysmal atrial fibrillation: Secondary | ICD-10-CM

## 2015-03-15 DIAGNOSIS — I10 Essential (primary) hypertension: Secondary | ICD-10-CM | POA: Diagnosis not present

## 2015-03-15 DIAGNOSIS — I739 Peripheral vascular disease, unspecified: Secondary | ICD-10-CM | POA: Diagnosis not present

## 2015-03-15 DIAGNOSIS — E785 Hyperlipidemia, unspecified: Secondary | ICD-10-CM

## 2015-03-15 DIAGNOSIS — I2510 Atherosclerotic heart disease of native coronary artery without angina pectoris: Secondary | ICD-10-CM

## 2015-03-15 NOTE — Assessment & Plan Note (Signed)
History of hypertension with blood pressure measured today at 116/64. He is on amlodipine, and hydralazine as well as carvedilol. Continue current meds at current dosing

## 2015-03-15 NOTE — Assessment & Plan Note (Signed)
History of hyperlipidemia on statin therapy followed by his PCP 

## 2015-03-15 NOTE — Patient Instructions (Signed)
Medication Instructions:   NO CHANGE  Follow-Up:  Your physician recommends that you schedule a follow-up appointment in: 6 WEEKS WITH EXTENDER  Your physician recommends that you schedule a follow-up appointment in: 3 MONTHS WITH DR Allyson SabalBERRY    If you need a refill on your cardiac medications before your next appointment, please call your pharmacy.

## 2015-03-15 NOTE — Assessment & Plan Note (Signed)
History of paroxysmal atrial fibrillation on Eliquis oral anticoagulations at reduced dose for renal insufficiency.

## 2015-03-15 NOTE — Assessment & Plan Note (Signed)
Status post right carotid endarterectomy in 2009 with recent Dopplers performed 04/13/14 revealing a widely patent endarterectomy site with mild to moderate left ICA stenosis. This will be repeated on an annual basis.

## 2015-03-15 NOTE — Assessment & Plan Note (Signed)
History of CAD status post coronary artery bypass grafting in 1994. He had a cardiac catheterization performed by Dr. Tresa EndoKelly 03/02/11 with a failed attempt at circumflex recanalization, procainamide by wire tip fracture. His LIMA to his LAD was patent as was sustained to his diagonal branch and vein to the PDA and PLA sequentially. The vein to the marginal branch was occluded. His EF was 35-40% with anteroapical hypokinesia. Myoview performed 03/30/11 showed mild lateral ischemia explainable by his anatomy. He was recently seen in the emergency room 2 days ago by Dr. Anne FuSkains with an episode of nitrate responsive angina. His enzymes were negative.

## 2015-03-15 NOTE — Assessment & Plan Note (Signed)
Chronic systolic heart failure on Lasix 40 mg a day with serum creatinine creatinine is around in the low 2 range.

## 2015-03-15 NOTE — Progress Notes (Signed)
03/15/2015 [name]   12/15/1932  [id number]  Primary Physician [name] Blamer, MD Primary Cardiologist: [name] MD [name]   HPI:  The patient is a very pleasant 81 year old, mildly overweight, married Caucasian male, father of 2, grandfather to 1 grandchild who I last saw 08/27/14.[name] He has a history of CAD status post coronary artery bypass grafting in 1994. He also has hypertension, hyperlipidemia, insulin-dependent diabetes and peripheral vascular disease. He had right carotid endarterectomy back in 2009 and has been followed by duplex ultrasound here in our office, most recently July of last year. He has mild renal insufficiency. His creatinines run in the 2 range. He was cath'd by Dr. Tresa Endo March 02, 2011, with a failed attempt at native circumflex recanalization complicated by fractured wire tip. His LIMA to his LAD was patent, as was the vein to a diagonal branch and a vein to the PDA and PLA sequentially. The vein to the marginal branch was occluded. His EF was 35% to 40% with anteroapical hypokinesia. He is totally asymptomatic. His Myoview performed March 30, 2011, showed mild lateral ischemia explainable by his anatomy. He does complain of some abdominal pain which apparently may be related to a hernia which she is seeing a Development worker, international aid for. Recent lab work performed in December revealed total cholesterol 127, LDL of 61 and HDL 38. He saw [name] PAC back in the office 12/09/12. His medicines were adjusted. He had a small bowel obstruction in October was treated with decompression. Apparently at that time his ejection fraction was increased by 2-D echo.   Because of episodes of PAF down on monitoring he was begun on Eliquis oral anti-coagulation at reduced dose for renal insufficiency. He is on chronic Lasix because of class 2-3 heart failure with an EF of 35-40%. He runs creatinines in the low 2 range and is followed by Dr. [name]. He was  recently seen in the emergency room by Dr. [name] 2 days ago but responsive angina. His enzymes were negative.   Current Outpatient Prescriptions  Medication Sig Dispense Refill  . acetaminophen (TYLENOL) 325 MG tablet Take 650 mg by mouth every 6 (six) hours as needed for pain.    [name] amLODipine (NORVASC) 10 MG tablet Take 10 mg by mouth daily.      [name] apixaban (ELIQUIS) 2.5 MG TABS tablet Take 2 tablets (5 mg total) by mouth 2 (two) times daily. (Patient taking differently: Take 2.5 mg by mouth 2 (two) times daily. ) 180 tablet 3  . aspirin 81 MG tablet Take 81 mg by mouth daily.    . carvedilol (COREG) 25 MG tablet TAKE 1&1/2 TABLETS BY MOUTH 2 TIMES DAILY WITH MEALS 270 tablet 3  . cholecalciferol (VITAMIN D) 1000 UNITS tablet Take 1,000 Units by mouth daily.    . diphenhydrAMINE (BENADRYL) 25 MG tablet Take 25 mg by mouth daily.    . ferrous sulfate 325 (65 FE) MG tablet Take 325 mg by mouth daily with breakfast.    . FOLIC ACID PO Take 600 mcg by mouth daily.    . furosemide (LASIX) 20 MG tablet Take 20 mg by mouth 2 (two) times daily.    [name] glipiZIDE (GLUCOTROL) 10 MG tablet Take 20 mg by mouth 2 (two) times daily.     . Glucosamine-Chondroitin (MOVE FREE PO) Take 1 tablet by mouth 2 (two) times daily.     . hydrALAZINE (APRESOLINE) 50 MG tablet Take 1 tablet (50 mg total) by  mouth 2 (two) times daily. 180 tablet 1  . isosorbide mononitrate (IMDUR) 60 MG 24 hr tablet Take 60 mg by mouth daily.    [name]. loratadine (CLARITIN) 10 MG tablet Take 10 mg by mouth daily. Reported on 03/13/2015    . Multiple Vitamins-Minerals (MULTIVITAMINS THER. W/MINERALS) TABS Take 1 tablet by mouth daily.      . nitroGLYCERIN (NITROSTAT) 0.4 MG SL tablet Place 0.4 mg under the tongue every 5 (five) minutes as needed for chest pain.    [name]. omeprazole (PRILOSEC) 20 MG capsule Take 20 mg by mouth 2 (two) times daily.     . pravastatin (PRAVACHOL) 40 MG tablet Take 40 mg by mouth at bedtime.       No current  facility-administered medications for this visit.    Allergies  Allergen Reactions  . Lisinopril Cough    Social History   Social History  . Marital Status: Married    Spouse Name: N/A  . Number of Children: N/A  . Years of Education: N/A   Occupational History  . Not on file.   Social History Main Topics  . Smoking status: Never Smoker   . Smokeless tobacco: Never Used     Comment: quit smoking in the 1960's  . Alcohol Use: No  . Drug Use: No  . Sexual Activity: Yes   Other Topics Concern  . Not on file   Social History Narrative     Review of Systems: General: negative for chills, fever, night sweats or weight changes.  Cardiovascular: negative for chest pain, dyspnea on exertion, edema, orthopnea, palpitations, paroxysmal nocturnal dyspnea or shortness of breath Dermatological: negative for rash Respiratory: negative for cough or wheezing Urologic: negative for hematuria Abdominal: negative for nausea, vomiting, diarrhea, bright red blood per rectum, melena, or hematemesis Neurologic: negative for visual changes, syncope, or dizziness All other systems reviewed and are otherwise negative except as noted above.    Blood pressure 116/64, pulse 55, height 5\' 10"  (1.778 m), weight 195 lb (88.451 kg).  General appearance: alert and no distress Neck: no adenopathy, no JVD, supple, symmetrical, trachea midline, thyroid not enlarged, symmetric, no tenderness/mass/nodules and right carotid bruit Lungs: ooccasional basilar crackles Heart: regular rate and rhythm, S1, S2 normal, no murmur, click, rub or gallop Extremities: 1+ pitting edema bilaterally with compression stockings  EKG not performed today  ASSESSMENT AND PLAN:   PVD - RCE '09 Status post right carotid endarterectomy in 2009 with recent Dopplers performed 04/13/14 revealing a widely patent endarterectomy site with mild to moderate left ICA stenosis. This will be repeated on an annual basis.  PAF  (paroxysmal atrial fibrillation) (HCC) History of paroxysmal atrial fibrillation on Eliquis oral anticoagulations at reduced dose for renal insufficiency.  Hyperlipemia History of hyperlipidemia on statin therapy followed by his PCP  Essential hypertension History of hypertension with blood pressure measured today at 116/64. He is on amlodipine, and hydralazine as well as carvedilol. Continue current meds at current dosing  CAD - CABG '94. PCI attempt 2012, Myoview low risk Jan 2013 History of CAD status post coronary artery bypass grafting in 1994. He had a cardiac catheterization performed by Dr. Tresa EndoKelly 03/02/11 with a failed attempt at circumflex recanalization, procainamide by wire tip fracture. His LIMA to his LAD was patent as was sustained to his diagonal branch and vein to the PDA and PLA sequentially. The vein to the marginal branch was occluded. His EF was 35-40% with anteroapical hypokinesia. Myoview performed 03/30/11 showed mild lateral ischemia  explainable by his anatomy. He was recently seen in the emergency room 2 days ago by Dr. [name] with an episode of nitrate responsive angina. His enzymes were negative.  Cardiomyopathy, ischemic- last EF 35-40% by echo May 2016 Chronic systolic heart failure on Lasix 40 mg a day with serum creatinine creatinine is around in the low 2 range.      [name] MD FACP,FACC,FAHA, [facility] 03/15/2015 10:41 AM

## 2015-03-31 ENCOUNTER — Other Ambulatory Visit: Payer: Self-pay | Admitting: Cardiovascular Disease

## 2015-03-31 DIAGNOSIS — I6523 Occlusion and stenosis of bilateral carotid arteries: Secondary | ICD-10-CM

## 2015-04-06 ENCOUNTER — Telehealth: Payer: Self-pay

## 2015-04-06 NOTE — Telephone Encounter (Signed)
Pt contacted per request of Dr. Allyson SabalBerry.  Pt stated he started have CP 3 to 4 times over the past week, that was relieved after one Nitro.  Pt stated it has not happened since Sunday, 1/8 and it only seems to happen at night when he is laying flat. He has no c/o of SOB.  Pt taking medications as ordered and checks weight daily. Pt weight has not adjusted more than 1 to 2 lbs with daily weights.  Pt stated he has an appt on 05/03/2015 with PA, and he is feeling fine now, and will wait until then.  Pt knows to call our office sooner if he notices it is happening more often.  Pt verbalized understanding and no additional questions at this time.

## 2015-04-07 ENCOUNTER — Telehealth: Payer: Self-pay | Admitting: Cardiology

## 2015-04-07 NOTE — Telephone Encounter (Signed)
Received records from [facility] for appointment on 05/03/15 with Corine ShelterLuke Kilroy, PA.  Records given to [employer] (medical records) for Luke's schedule on 05/03/15. lp

## 2015-04-11 ENCOUNTER — Other Ambulatory Visit (HOSPITAL_COMMUNITY): Payer: Self-pay | Admitting: *Deleted

## 2015-04-12 ENCOUNTER — Encounter (HOSPITAL_COMMUNITY)
Admission: RE | Admit: 2015-04-12 | Discharge: 2015-04-12 | Disposition: A | Discharge status: Home / Self Care | Payer: Medicare Other | Source: Ambulatory Visit | Attending: Nephrology | Admitting: Nephrology

## 2015-04-12 DIAGNOSIS — D509 Iron deficiency anemia, unspecified: Secondary | ICD-10-CM | POA: Insufficient documentation

## 2015-04-12 MED ORDER — SODIUM CHLORIDE 0.9 % IV SOLN
510.0000 mg | INTRAVENOUS | Status: DC
  Administered 2015-04-12: 510 mg via INTRAVENOUS
  Filled 2015-04-12: qty 17

## 2015-04-13 ENCOUNTER — Ambulatory Visit (HOSPITAL_COMMUNITY)
Admission: RE | Admit: 2015-04-13 | Discharge: 2015-04-13 | Disposition: A | Discharge status: Home / Self Care | Payer: Medicare Other | Source: Ambulatory Visit | Attending: Cardiovascular Disease | Admitting: Cardiovascular Disease

## 2015-04-13 DIAGNOSIS — Z951 Presence of aortocoronary bypass graft: Secondary | ICD-10-CM | POA: Insufficient documentation

## 2015-04-13 DIAGNOSIS — I10 Essential (primary) hypertension: Secondary | ICD-10-CM | POA: Diagnosis not present

## 2015-04-13 DIAGNOSIS — I6523 Occlusion and stenosis of bilateral carotid arteries: Secondary | ICD-10-CM | POA: Diagnosis not present

## 2015-04-13 DIAGNOSIS — E785 Hyperlipidemia, unspecified: Secondary | ICD-10-CM | POA: Insufficient documentation

## 2015-04-13 DIAGNOSIS — I2510 Atherosclerotic heart disease of native coronary artery without angina pectoris: Secondary | ICD-10-CM | POA: Insufficient documentation

## 2015-04-13 DIAGNOSIS — I739 Peripheral vascular disease, unspecified: Secondary | ICD-10-CM | POA: Insufficient documentation

## 2015-04-13 DIAGNOSIS — E119 Type 2 diabetes mellitus without complications: Secondary | ICD-10-CM | POA: Diagnosis not present

## 2015-04-14 ENCOUNTER — Encounter (HOSPITAL_COMMUNITY): Payer: Medicare Other

## 2015-04-15 ENCOUNTER — Other Ambulatory Visit: Payer: Self-pay

## 2015-04-15 DIAGNOSIS — E785 Hyperlipidemia, unspecified: Secondary | ICD-10-CM

## 2015-04-18 ENCOUNTER — Other Ambulatory Visit (HOSPITAL_COMMUNITY): Payer: Self-pay | Admitting: *Deleted

## 2015-04-19 ENCOUNTER — Ambulatory Visit (HOSPITAL_COMMUNITY)
Admission: RE | Admit: 2015-04-19 | Discharge: 2015-04-19 | Disposition: A | Discharge status: Home / Self Care | Payer: Medicare Other | Source: Ambulatory Visit | Attending: Nephrology | Admitting: Nephrology

## 2015-04-19 DIAGNOSIS — D509 Iron deficiency anemia, unspecified: Secondary | ICD-10-CM | POA: Insufficient documentation

## 2015-04-19 MED ORDER — SODIUM CHLORIDE 0.9 % IV SOLN
510.0000 mg | INTRAVENOUS | Status: DC
  Administered 2015-04-19: 510 mg via INTRAVENOUS
  Filled 2015-04-19: qty 17

## 2015-05-03 ENCOUNTER — Encounter: Payer: Self-pay | Admitting: Cardiology

## 2015-05-03 ENCOUNTER — Ambulatory Visit (INDEPENDENT_AMBULATORY_CARE_PROVIDER_SITE_OTHER): Payer: Medicare Other | Admitting: Cardiology

## 2015-05-03 VITALS — BP 112/60 | HR 60 | Ht 71.0 in | Wt 193.0 lb

## 2015-05-03 DIAGNOSIS — N183 Chronic kidney disease, stage 3 unspecified: Secondary | ICD-10-CM

## 2015-05-03 DIAGNOSIS — J449 Chronic obstructive pulmonary disease, unspecified: Secondary | ICD-10-CM

## 2015-05-03 DIAGNOSIS — I447 Left bundle-branch block, unspecified: Secondary | ICD-10-CM

## 2015-05-03 DIAGNOSIS — R0600 Dyspnea, unspecified: Secondary | ICD-10-CM | POA: Insufficient documentation

## 2015-05-03 DIAGNOSIS — I480 Paroxysmal atrial fibrillation: Secondary | ICD-10-CM

## 2015-05-03 DIAGNOSIS — I2510 Atherosclerotic heart disease of native coronary artery without angina pectoris: Secondary | ICD-10-CM

## 2015-05-03 DIAGNOSIS — R0609 Other forms of dyspnea: Secondary | ICD-10-CM | POA: Diagnosis not present

## 2015-05-03 DIAGNOSIS — I255 Ischemic cardiomyopathy: Secondary | ICD-10-CM | POA: Diagnosis not present

## 2015-05-03 DIAGNOSIS — D638 Anemia in other chronic diseases classified elsewhere: Secondary | ICD-10-CM

## 2015-05-03 DIAGNOSIS — N189 Chronic kidney disease, unspecified: Secondary | ICD-10-CM

## 2015-05-03 DIAGNOSIS — Z7901 Long term (current) use of anticoagulants: Secondary | ICD-10-CM

## 2015-05-03 NOTE — Assessment & Plan Note (Signed)
Pt says he has had gradually increasing DOE x 6 months. "I can't even get the paper without being SOB".

## 2015-05-03 NOTE — Assessment & Plan Note (Signed)
No angina since his ED visit in Jan

## 2015-05-03 NOTE — Assessment & Plan Note (Signed)
EKG  Not done today, will have one done when he comes for his echo. I believe he was in NSR on exam

## 2015-05-03 NOTE — Assessment & Plan Note (Signed)
He has been getting iron infusions

## 2015-05-03 NOTE — Progress Notes (Signed)
05/03/2015 [name]   Jul 25, 1932  [id number]  Primary Physician [name], MD Primary Cardiologist: Dr Allyson Sabal  HPI:  82 year old male, followed by Dr Allyson Sabal with a history of CAD, status post CABG in 1994. He also has HTN, HLD, IDDM, CRI, and PVD.  His creatinines run in the 2 -2.5 range, he is followed by Dr Abel Presto. He was cath'd by Dr. Tresa Endo March 02, 2011, and had a failed attempt at native circumflex recanalization complicated by fractured wire tip. His LIMA to his LAD was patent, as was the vein to a diagonal branch and a vein to the PDA and PLA sequentially. The vein to the marginal branch was occluded. His Myoview performed March 30, 2011, showed mild lateral ischemia explainable by his anatomy. He has had chest pain off and on since. He was in the ED in Jan but says his chest pain has improved since.           The pt says he has not felt well since he had "the flu" in Sept. In Oct he had a SBO treated conservatively. He says he has been on and off antibiotics since. He did have his diuretics adjusted once for CHF but he tells this dyspnea "is not my heart failure". He denies orthopnea or edema. He says he has had chronic productive cough- white sputum. His last echo in May 2016 showed his EF to be stable at 35-40%. The pt is retired from [employer]. He quite smoking 50 yrs ago but says he was exposed to asbestos at [employer], the filters for [employer] cigarettes where made with asbestos.     Current Outpatient Prescriptions  Medication Sig Dispense Refill  . acetaminophen (TYLENOL) 325 MG tablet Take 650 mg by mouth every 6 (six) hours as needed for pain.    [name] amLODipine (NORVASC) 10 MG tablet Take 10 mg by mouth daily.      [name] apixaban (ELIQUIS) 2.5 MG TABS tablet Take 2 tablets (5 mg total) by mouth 2 (two) times daily. (Patient taking differently: Take 2.5 mg by mouth 2 (two) times daily. ) 180 tablet 3  . aspirin 81 MG tablet Take 81 mg by mouth daily.    . carvedilol  (COREG) 25 MG tablet TAKE 1&1/2 TABLETS BY MOUTH 2 TIMES DAILY WITH MEALS 270 tablet 3  . cholecalciferol (VITAMIN D) 1000 UNITS tablet Take 1,000 Units by mouth daily.    . diphenhydrAMINE (BENADRYL) 25 MG tablet Take 25 mg by mouth daily.    . ferrous sulfate 325 (65 FE) MG tablet Take 325 mg by mouth daily with breakfast.    . FOLIC ACID PO Take 600 mcg by mouth daily.    . furosemide (LASIX) 20 MG tablet Take 20 mg by mouth 2 (two) times daily.    [name] glipiZIDE (GLUCOTROL) 10 MG tablet Take 20 mg by mouth 2 (two) times daily.     . Glucosamine-Chondroitin (MOVE FREE PO) Take 1 tablet by mouth 2 (two) times daily.     . hydrALAZINE (APRESOLINE) 50 MG tablet Take 1 tablet (50 mg total) by mouth 2 (two) times daily. 180 tablet 1  . isosorbide mononitrate (IMDUR) 60 MG 24 hr tablet Take 60 mg by mouth daily.    . Multiple Vitamins-Minerals (MULTIVITAMINS THER. W/MINERALS) TABS Take 1 tablet by mouth daily.      . nitroGLYCERIN (NITROSTAT) 0.4 MG SL tablet Place 0.4 mg under the tongue every 5 (five) minutes as needed for chest pain.    [name]  omeprazole (PRILOSEC) 20 MG capsule Take 20 mg by mouth 2 (two) times daily.     . pravastatin (PRAVACHOL) 40 MG tablet Take 40 mg by mouth at bedtime.       No current facility-administered medications for this visit.    Allergies  Allergen Reactions  . Lisinopril Cough    Social History   Social History  . Marital Status: Married    Spouse Name: N/A  . Number of Children: N/A  . Years of Education: N/A   Occupational History  . Not on file.   Social History Main Topics  . Smoking status: Never Smoker   . Smokeless tobacco: Never Used     Comment: quit smoking in the 1960's  . Alcohol Use: No  . Drug Use: No  . Sexual Activity: Yes   Other Topics Concern  . Not on file   Social History Narrative     Review of Systems: General: negative for chills, fever, night sweats or weight changes.  Cardiovascular: negative for chest pain, dyspnea  on exertion, edema, orthopnea, palpitations, paroxysmal nocturnal dyspnea or shortness of breath Dermatological: negative for rash Respiratory: negative for cough or wheezing Urologic: negative for hematuria Abdominal: negative for nausea, vomiting, diarrhea, bright red blood per rectum, melena, or hematemesis Neurologic: negative for visual changes, syncope, or dizziness All other systems reviewed and are otherwise negative except as noted above.    Blood pressure 112/60, pulse 60, height  (1.803 m), weight 193 lb (87.544 kg).  General appearance: alert, cooperative and no distress Neck: no JVD Lungs: bilateral velcro crackles Rt base > Lt Heart: regular rate and rhythm Extremities: no edema Skin: Skin color, texture, turgor normal. No rashes or lesions Neurologic: Grossly normal   ASSESSMENT AND PLAN:   Dyspnea on exertion Pt says he has had gradually increasing DOE x 6 months. "I can't even get the paper without being SOB".   Cardiomyopathy, ischemic- last EF 35-40% by echo May 2016 He does not appear to be in CHF on exam  CAD - CABG '94. PCI attempt 2012, Myoview low risk Jan 2013 No angina since his ED visit in Jan  PAF (paroxysmal atrial fibrillation) (HCC) EKG  Not done today, will have one done when he comes for his echo. I believe he was in NSR on exam  Chronic anticoagulation Eliquis 2.5 mg BID  LBBB (left bundle branch block) Chronic  Chronic renal insufficiency, stage III (moderate)- follwed by Dr Abel Presto Last seen Jan 11- SCr has been running 2-2.6  Anemia of chronic disease He has been getting iron infusions   PLAN  Discussed with Dr Herbie Baltimore I'm concerned the pt has advanced COPD causing his DOE- (see CXR Dec 2016). I discussed with Dr Herbie Baltimore in the office today. We will check an echo and BNP. He'll be referred for pulmonary evaluation pending these studies. I have also asked that an EKG be done when he comes in for his echo, ? If AF is  contributing to his symptoms.   Kitt Ledet K PA-C 05/03/2015 10:26 AM

## 2015-05-03 NOTE — Assessment & Plan Note (Signed)
Last seen Jan 11- SCr has been running 2-2.6

## 2015-05-03 NOTE — Patient Instructions (Signed)
Medication Instructions:   NO CHANGE  Labwork:  Your physician recommends that you HAVE LAB WORK TODAY  Testing/Procedures:  Your physician has requested that you have an echocardiogram. Echocardiography is a painless test that uses sound waves to create images of your heart. It provides your doctor with information about the size and shape of your heart and how well your heart's Bellizzi and valves are working. This procedure takes approximately one hour. There are no restrictions for this procedure.    Follow-Up:  Your physician recommends that you schedule a follow-up appointment in: 3 MONTHS WITH DR Allyson Sabal   If you need a refill on your cardiac medications before your next appointment, please call your pharmacy.   REFERRAL TO PULMONARY FOR DYSPNEA AND COPD-SCHEDULE AFTER TESTING IS COMPLETE

## 2015-05-03 NOTE — Assessment & Plan Note (Signed)
Chronic. 

## 2015-05-03 NOTE — Assessment & Plan Note (Signed)
He does not appear to be in CHF on exam

## 2015-05-03 NOTE — Assessment & Plan Note (Signed)
Eliquis 2.5mg BID.  

## 2015-05-04 ENCOUNTER — Encounter: Payer: Self-pay | Admitting: Pulmonary Disease

## 2015-05-04 ENCOUNTER — Ambulatory Visit (INDEPENDENT_AMBULATORY_CARE_PROVIDER_SITE_OTHER): Payer: Medicare Other | Admitting: Pulmonary Disease

## 2015-05-04 ENCOUNTER — Telehealth: Payer: Self-pay

## 2015-05-04 VITALS — BP 128/58 | HR 55 | Ht 73.0 in | Wt 194.6 lb

## 2015-05-04 DIAGNOSIS — R0609 Other forms of dyspnea: Secondary | ICD-10-CM

## 2015-05-04 DIAGNOSIS — R938 Abnormal findings on diagnostic imaging of other specified body structures: Secondary | ICD-10-CM

## 2015-05-04 DIAGNOSIS — R9389 Abnormal findings on diagnostic imaging of other specified body structures: Secondary | ICD-10-CM

## 2015-05-04 DIAGNOSIS — I255 Ischemic cardiomyopathy: Secondary | ICD-10-CM | POA: Diagnosis not present

## 2015-05-04 DIAGNOSIS — R05 Cough: Secondary | ICD-10-CM

## 2015-05-04 DIAGNOSIS — J439 Emphysema, unspecified: Secondary | ICD-10-CM

## 2015-05-04 DIAGNOSIS — Z8701 Personal history of pneumonia (recurrent): Secondary | ICD-10-CM

## 2015-05-04 DIAGNOSIS — R059 Cough, unspecified: Secondary | ICD-10-CM

## 2015-05-04 DIAGNOSIS — J189 Pneumonia, unspecified organism: Secondary | ICD-10-CM | POA: Insufficient documentation

## 2015-05-04 LAB — BRAIN NATRIURETIC PEPTIDE: Brain Natriuretic Peptide: 991.9 pg/mL — ABNORMAL HIGH (ref <100)

## 2015-05-04 MED ORDER — ALBUTEROL SULFATE HFA 108 (90 BASE) MCG/ACT IN AERS: 2.0000 | INHALATION_SPRAY | Freq: Four times a day (QID) | RESPIRATORY_TRACT | Status: DC | PRN

## 2015-05-04 MED ORDER — BUDESONIDE-FORMOTEROL FUMARATE 160-4.5 MCG/ACT IN AERO: 2.0000 | INHALATION_SPRAY | Freq: Two times a day (BID) | RESPIRATORY_TRACT | Status: DC

## 2015-05-04 NOTE — Telephone Encounter (Signed)
Pt scheduled 06/14/15 for PFT and OV.

## 2015-05-04 NOTE — Telephone Encounter (Signed)
Pt needs appt in 1 month. AD schedule not available yet.  

## 2015-05-04 NOTE — Assessment & Plan Note (Signed)
Pt with CAD, CHF EF 45-50. Follows with cardiology. Sees Dr. Allyson Sabal and Kathi Der.  He does not seem to be in acute exacer of CHF. Plan: 1. Cont lasix. 2. rx copd. 3. Needs chest ct scan.

## 2015-05-04 NOTE — Assessment & Plan Note (Signed)
Pt with chronic cough but seems worse since aug 2016. Has PND and frequent cleariing of throat. Denies other allergies. May have CB. Has GERD but stable.  Plan: 1. Start flonase. Will use his wifes. 2. May need singulair, zyrtec. 3. Rx GERD. 4. May need daliresp.

## 2015-05-04 NOTE — Assessment & Plan Note (Signed)
20PY smoking history, quit in 1965. No known lung dse. CXR in 2012 showed COPD changes.  Recent SOB. Has cardiac issues with seem stable. Likely with moderate COPD. Plan: 1. Needs PFT 2. Start symbicort 160/4.5 2P  BID 3. Start alb HFA prn 4. Need to observe HR on MDIs. Has afib -- HR in 60s. 5. May have chronic bronchitis as well. May need to start daliresp. 6. UTD with flu and pna vaccine.

## 2015-05-04 NOTE — Patient Instructions (Signed)
1. COPD  You are diagnosed with Chronic Obstructive Pulmonary Disease or COPD.  COPD is a preventable and treatable disease that makes it difficult to empty air out of the lungs (airflow obstruction).  This can lead to shortness of breath.   Sometimes, when you have a lung infection, this can make your breathing worse, and will cause you to have a COPD flare-up or an acute exacerbation of COPD. Please call your primary care doctor or the office if you are having a COPD flare-up.   Smoking makes COPD worse.   Make sure you use your medications for COPD -- We will start Symbicort 160/4.5 2puffs 2x/day  Rescue medications: Albuterol 2 puffs every 4 hours as needed for shortness of breath.   Please rinse your mouth each time you use your maintenance medication.  Please call the office if you are having issues with your medications  We will schedule you fro a breathing test or PFT.    2. Cough  Your cough is most likely multifactorial. Likely related to the following: A. Upper Airway Cough Syndrome  Start Flonase 2 squirts per nostril at HS   B. GERD  Cont Omeprazole 2x/day    Advised on dietary changes. Avoid food that will make your reflux worse.   Try to keep head of bed 30 degrees elevated.  Wait 2 hours at least after your last meal before you lie down C. Asthma-type reaction, allergies  Start Symbicort   We will try this plan and see if your cough gets better. Please call the office if your cough worsens while on treatment.    3. We will schedule you for a chest ct scan.   Return to clinic in 1 month.    Lance Sell A. Christene Slates, MD Pulmonary and Critical Care Medicine [facility] [phone], Fax: [phone]

## 2015-05-04 NOTE — Assessment & Plan Note (Signed)
Pt had bronchitis with the "wicked virus" in aug 2016 for which he ended up on doxy x 4-5 weeks. CXR in 02/2015 -- B infiltrates. ? Heart failure, ? ILD.  CXR in early 2016 -- COPD changes CXR in 2012 -- COPD changes. Need to determine if pt has ILD or non resolving PNA. Plan: 1. Needs chest ct scan, no contrats. (Has st 2 CKD).

## 2015-05-04 NOTE — Progress Notes (Signed)
Subjective:    Patient ID: [name], male    DOB: September 19, 1932, 82 y.o.   MRN: [id number]  HPI   This is the case of [name], 82 y.o. Male, who was referred by Dr. Nanetta Batty and [name] in consultation regarding dyspnea.   As you very well know, patient has a 20-30 PY smoking history, quit in 1965.  Not been dxed with asthma or copd or allergies. Pt has CAD, S/P CABG, and CHF.  Not on O2.  Pt was his usual self until aug 2016. Had bronchitis/pna, likely viral. Was given doxy for 4-5 weeks. Sx slowly improved. He remained SOB though.   He  Does ADL OK. He gets SOB with more exertion -- worse than aug 2016. Pt continues to cough up mucus. Just finished another round of Doxycycline.   He is here mainly since his constant cough and clearing of throat is not better since aug 2016 and he feels he is more SOB with exertion.   He follows with Cardiology.    Review of Systems  Constitutional: Negative.  Negative for fever, fatigue and unexpected weight change.  HENT: Positive for postnasal drip and sore throat. Negative for congestion, dental problem, ear pain, nosebleeds, rhinorrhea, sinus pressure, sneezing and trouble swallowing.   Eyes: Negative.  Negative for redness and itching.  Respiratory: Positive for cough and shortness of breath. Negative for choking, chest tightness and wheezing.   Cardiovascular: Negative.  Negative for chest pain, palpitations and leg swelling.  Gastrointestinal: Negative.  Negative for nausea and vomiting.  Endocrine: Negative.   Genitourinary: Negative.  Negative for dysuria.  Musculoskeletal: Positive for arthralgias. Negative for joint swelling.  Skin: Negative.  Negative for rash.  Allergic/Immunologic: Negative.   Neurological: Negative.  Negative for headaches.  Hematological: Bruises/bleeds easily.  Psychiatric/Behavioral: Negative.  Negative for dysphoric mood. The patient is not nervous/anxious.   All other systems  reviewed and are negative.  Past Medical History  Diagnosis Date  . Angina at rest [facility]) 02/27/2011  . CAD (coronary artery disease) 02/27/2011    Catheterized on 02/28/11, graft supplying marginal vessel was occluded, after 48 hours of hydration, a repeat cath was performed in an attempt at PCI 03/02/11 cath had an unsuccessful attempt at PCI to an occluded calcified circumflex vessel, Residual distal tip of the ChoICE PT moderate support wire at the subtotal occlusion. No change in the subtotal occlusion,improved retrograde collateralization  . LV dysfunction 02/27/2011  . CKD (chronic kidney disease) stage 3, GFR 30-59 ml/min 02/27/2011  . LBBB (left bundle branch block) 02/27/2011  . Hypertension   . Hyperlipemia 02/27/2011  . Diabetes mellitus 02/27/2011    insulin dependent  . GERD (gastroesophageal reflux disease)   . CHF (congestive heart failure) (HCC)   . Blood transfusion     no reaction to transfusion per patient  . Arthritis   . Myocardial infarction (HCC)   . PVD (peripheral vascular disease) (HCC)     Carotid Angiography was performed in 2007, no treatment delivered at that time. Right carotid endarterectomy in 2009, being followed by duplex ultrasound. Doppler remains stable.  [name] CAD (coronary artery disease)     PTCA of the right coronary artery in Feb. 1987, July 1987, and June 1990. Circumflex PTCA in July 1993, followed by a RCA, PTCA in Sept. 1994. CABG in 1994x5 with a LIMA to the LAD, vein graft to the first diagonal, vein graft to the marginal and left circumflex coronary artery, and  sequential graft to the PDA and PLA branches of the RCA  . HTN (hypertension)     Renal dopplers have been performed on a semi annual basis. Left renal artery demonstrated vessel narrowing with elevated velocities consistent with a 1-59% diameter reduction.   . carotid doppler     Left ECA : The proximal most aspect of the left external carotid artery demonstrates a peak systolic velocity of  493.9 cm/s. This is consistent with a 70-99% significant stenosis correlated with a 2007 angiogram.  . Paroxysmal atrial fibrillation (HCC)    (-) CA, DVT  Family History  Problem Relation Age of Onset  . Cancer Mother     unknown to pt  . Cancer Father     pancreas  . Cancer Paternal Grandfather     prostate     Past Surgical History  Procedure Laterality Date  . Coronary artery bypass graft  1994  . Spinal fusion    . Back surgery    . Carotid endarterectomy  2009  . Myoview scan  03/30/2011  . Left heart catheterization with coronary/graft angiogram N/A 02/28/2011    Procedure: LEFT HEART CATHETERIZATION WITH Isabel Caprice;  Surgeon: Lennette Bihari, MD;  Location: [facility] CATH LAB;  Service: Cardiovascular;  Laterality: N/A;  . Percutaneous coronary stent intervention (pci-s) N/A 03/02/2011    Procedure: PERCUTANEOUS CORONARY STENT INTERVENTION (PCI-S);  Surgeon: Lennette Bihari, MD;  Location[facility] CATH LAB;  Service: Cardiovascular;  Laterality: N/A;    Social History   Social History  . Marital Status: Married    Spouse Name: N/A  . Number of Children: N/A  . Years of Education: N/A   Occupational History  . Not on file.   Social History Main Topics  . Smoking status: Former Smoker -- 22 years    Quit date: 03/26/1958  . Smokeless tobacco: Never Used     Comment: quit smoking in the 1960's  . Alcohol Use: No  . Drug Use: No  . Sexual Activity: Yes   Other Topics Concern  . Not on file   Social History Narrative   worked at a cigarette factory.   Allergies  Allergen Reactions  . Lisinopril Cough     Outpatient Prescriptions Prior to Visit  Medication Sig Dispense Refill  . amLODipine (NORVASC) 10 MG tablet Take 10 mg by mouth daily.      [name] apixaban (ELIQUIS) 2.5 MG TABS tablet Take 2 tablets (5 mg total) by mouth 2 (two) times daily. (Patient taking differently: Take 2.5 mg by mouth 2 (two) times daily. ) 180 tablet 3  . aspirin 81 MG tablet Take 81  mg by mouth daily.    . carvedilol (COREG) 25 MG tablet TAKE 1&1/2 TABLETS BY MOUTH 2 TIMES DAILY WITH MEALS 270 tablet 3  . cholecalciferol (VITAMIN D) 1000 UNITS tablet Take 1,000 Units by mouth daily.    . diphenhydrAMINE (BENADRYL) 25 MG tablet Take 25 mg by mouth daily.    . ferrous sulfate 325 (65 FE) MG tablet Take 325 mg by mouth daily with breakfast.    . FOLIC ACID PO Take 600 mcg by mouth daily.    . furosemide (LASIX) 20 MG tablet Take 20 mg by mouth 2 (two) times daily.    [name] glipiZIDE (GLUCOTROL) 10 MG tablet Take 20 mg by mouth 2 (two) times daily.     . Glucosamine-Chondroitin (MOVE FREE PO) Take 1 tablet by mouth 2 (two) times daily.     [name]  hydrALAZINE (APRESOLINE) 50 MG tablet Take 1 tablet (50 mg total) by mouth 2 (two) times daily. 180 tablet 1  . isosorbide mononitrate (IMDUR) 60 MG 24 hr tablet Take 60 mg by mouth daily.    . Multiple Vitamins-Minerals (MULTIVITAMINS THER. W/MINERALS) TABS Take 1 tablet by mouth daily.      . nitroGLYCERIN (NITROSTAT) 0.4 MG SL tablet Place 0.4 mg under the tongue every 5 (five) minutes as needed for chest pain.    [name] omeprazole (PRILOSEC) 20 MG capsule Take 20 mg by mouth 2 (two) times daily.     . pravastatin (PRAVACHOL) 40 MG tablet Take 40 mg by mouth at bedtime.      [name] acetaminophen (TYLENOL) 325 MG tablet Take 650 mg by mouth every 6 (six) hours as needed for pain.     No facility-administered medications prior to visit.   Meds ordered this encounter  Medications  . temazepam (RESTORIL) 15 MG capsule    Sig: Take 15 mg by mouth at bedtime as needed for sleep.  . budesonide-formoterol (SYMBICORT) 160-4.5 MCG/ACT inhaler    Sig: Inhale 2 puffs into the lungs 2 (two) times daily.    Dispense:  1 Inhaler    Refill:  6  . albuterol (PROVENTIL HFA;VENTOLIN HFA) 108 (90 Base) MCG/ACT inhaler    Sig: Inhale 2 puffs into the lungs every 6 (six) hours as needed for wheezing or shortness of breath.    Dispense:  1 Inhaler    Refill:  6           Objective:   Physical Exam   Vitals:  Filed Vitals:   05/04/15 1101  BP: 128/58  Pulse: 55  Height: 6\' 1"  (1.854 m)  Weight: 194 lb 9.6 oz (88.27 kg)  SpO2: 92%    Constitutional/General:  Pleasant, well-nourished, well-developed, not in any distress,  Comfortably seating.  Well kempt  Body mass index is 25.68 kg/(m^2).  HEENT: Pupils equal and reactive to light and accommodation. Anicteric sclerae. Normal nasal mucosa.   No oral  lesions,  mouth clear,  oropharynx clear, no postnasal drip. (-) Oral thrush. No dental caries.  Airway - Mallampati class III-IV  Neck: No masses. Midline trachea. No JVD, (-) LAD. (-) bruits appreciated.  Respiratory/Chest: Grossly normal chest. (-) deformity. (-) Accessory muscle use.  Symmetric expansion. (-) Tenderness on palpation.  Resonant on percussion.  Diminished BS on both lower lung zones. (-) wheezing, rhonchi Bibasilar crackles. (-) egophony  Cardiovascular: Regular rate and  rhythm, heart sounds normal, no murmur or gallops, no peripheral edema  Gastrointestinal:  Normal bowel sounds. Soft, non-tender. No hepatosplenomegaly.  (-) masses.   Musculoskeletal:  Normal muscle tone. Normal gait.   Extremities: Grossly normal. (-) clubbing, cyanosis.  (-) edema  Skin: (-) rash,lesions seen.   Neurological/Psychiatric : alert, oriented to time, place, person. Normal mood and affect       Assessment & Plan:  COPD (chronic obstructive pulmonary disease) with emphysema (HCC) 20PY smoking history, quit in 1965. No known lung dse. CXR in 2012 showed COPD changes.  Recent SOB. Has cardiac issues with seem stable. Likely with moderate COPD. Plan: 1. Needs PFT 2. Start symbicort 160/4.5 2P  BID 3. Start alb HFA prn 4. Need to observe HR on MDIs. Has afib -- HR in 60s. 5. May have chronic bronchitis as well. May need to start daliresp. 6. UTD with flu and pna vaccine.   COUGH Pt with chronic cough but seems  worse since aug  2016. Has PND and frequent cleariing of throat. Denies other allergies. May have CB. Has GERD but stable.  Plan: 1. Start flonase. Will use his wifes. 2. May need singulair, zyrtec. 3. Rx GERD. 4. May need daliresp.   Abnormal chest x-ray Pt had bronchitis with the "wicked virus" in aug 2016 for which he ended up on doxy x 4-5 weeks. CXR in 02/2015 -- B infiltrates. ? Heart failure, ? ILD.  CXR in early 2016 -- COPD changes CXR in 2012 -- COPD changes. Need to determine if pt has ILD or non resolving PNA. Plan: 1. Needs chest ct scan, no contrats. (Has st 2 CKD).   Dyspnea on exertion Pt with CAD, CHF EF 45-50. Follows with cardiology. Sees Dr. Allyson Sabal and Kathi Der.  He does not seem to be in acute exacer of CHF. Plan: 1. Cont lasix. 2. rx copd. 3. Needs chest ct scan.      Thank you very much for letting me participate in this patient's care. Please do not hesitate to give me a call if you have any questions or concerns regarding the treatment plan.   Patient will follow up with me in 1 month.     Lance Sell A. Christene Slates, MD Pulmonary and Critical Care Medicine [facility] [phone], Fax: [phone]

## 2015-05-05 ENCOUNTER — Telehealth: Payer: Self-pay | Admitting: *Deleted

## 2015-05-05 DIAGNOSIS — R7989 Other specified abnormal findings of blood chemistry: Secondary | ICD-10-CM

## 2015-05-05 DIAGNOSIS — R0609 Other forms of dyspnea: Secondary | ICD-10-CM

## 2015-05-05 NOTE — Telephone Encounter (Signed)
-----   Message from Abelino Derrick, [place] sent at 05/05/2015  2:33 PM EST ----- BNP suggests fluid overload. Ask [name] to increase his Lasix to 80 mg BID x 2 days, then 40 mg BID. F/U BMP and BNP when he comes in for his echo.  [name] PA-C 05/05/2015 2:33 PM

## 2015-05-05 NOTE — Telephone Encounter (Signed)
Spoke with pt, aware of [name]'s recommendations. He is concerned about the increase in the furosemide effects on his kidney function. He was seen by the pulmonary doctors and was given 2 inhalers. He would like for [name] to review the pulmonary notes and kidney function to make sure that is the dose he wants. Will forward to [facility]

## 2015-05-06 ENCOUNTER — Ambulatory Visit (INDEPENDENT_AMBULATORY_CARE_PROVIDER_SITE_OTHER)
Admission: RE | Admit: 2015-05-06 | Discharge: 2015-05-06 | Disposition: A | Discharge status: Home / Self Care | Payer: Medicare Other | Source: Ambulatory Visit | Attending: Pulmonary Disease | Admitting: Pulmonary Disease

## 2015-05-06 ENCOUNTER — Encounter: Payer: Self-pay | Admitting: Pulmonary Disease

## 2015-05-06 ENCOUNTER — Ambulatory Visit (INDEPENDENT_AMBULATORY_CARE_PROVIDER_SITE_OTHER): Payer: Medicare Other | Admitting: Pulmonary Disease

## 2015-05-06 VITALS — BP 114/58 | HR 54 | Wt 194.4 lb

## 2015-05-06 DIAGNOSIS — J439 Emphysema, unspecified: Secondary | ICD-10-CM

## 2015-05-06 DIAGNOSIS — I255 Ischemic cardiomyopathy: Secondary | ICD-10-CM

## 2015-05-06 DIAGNOSIS — R059 Cough, unspecified: Secondary | ICD-10-CM

## 2015-05-06 DIAGNOSIS — R05 Cough: Secondary | ICD-10-CM | POA: Diagnosis not present

## 2015-05-06 DIAGNOSIS — R591 Generalized enlarged lymph nodes: Secondary | ICD-10-CM

## 2015-05-06 DIAGNOSIS — J438 Other emphysema: Secondary | ICD-10-CM | POA: Diagnosis not present

## 2015-05-06 DIAGNOSIS — R938 Abnormal findings on diagnostic imaging of other specified body structures: Secondary | ICD-10-CM

## 2015-05-06 DIAGNOSIS — R0609 Other forms of dyspnea: Secondary | ICD-10-CM

## 2015-05-06 DIAGNOSIS — R9389 Abnormal findings on diagnostic imaging of other specified body structures: Secondary | ICD-10-CM

## 2015-05-06 DIAGNOSIS — R590 Localized enlarged lymph nodes: Secondary | ICD-10-CM

## 2015-05-06 DIAGNOSIS — R599 Enlarged lymph nodes, unspecified: Secondary | ICD-10-CM

## 2015-05-06 NOTE — Assessment & Plan Note (Addendum)
Pt with chronic cough but seems worse since aug 2016. Has PND and frequent cleariing of throat. Denies other allergies. May have CB. Has GERD but stable.  Plan: 1. Cont flonase. 2. May need singulair, zyrtec. 3. Rx GERD. 4. May need daliresp.

## 2015-05-06 NOTE — Progress Notes (Deleted)
Subjective:    Patient ID: [name], male DOB: 1933-03-23, 82 y.o. MRN: [id number]  HPI   This is the case of [name], 82 y.o. Male, who was referred by Dr. Nanetta Batty and [name] in consultation regarding dyspnea.   As you very well know, patient has a 20-30 PY smoking history, quit in 1965.  Not been dxed with asthma or copd or allergies. Pt has CAD, S/P CABG, and CHF.  Not on O2.  Pt was his usual self until aug 2016. Had bronchitis/pna, likely viral. Was given doxy for 4-5 weeks. Sx slowly improved. He remained SOB though.   He Does ADL OK. He gets SOB with more exertion -- worse than aug 2016. Pt continues to cough up mucus. Just finished another round of Doxycycline.   He is here mainly since his constant cough and clearing of throat is not better since aug 2016 and he feels he is more SOB with exertion.   He follows with Cardiology.    Review of Systems  Constitutional: Negative. Negative for fever, fatigue and unexpected weight change.  HENT: Positive for postnasal drip and sore throat. Negative for congestion, dental problem, ear pain, nosebleeds, rhinorrhea, sinus pressure, sneezing and trouble swallowing.  Eyes: Negative. Negative for redness and itching.  Respiratory: Positive for cough and shortness of breath. Negative for choking, chest tightness and wheezing.  Cardiovascular: Negative. Negative for chest pain, palpitations and leg swelling.  Gastrointestinal: Negative. Negative for nausea and vomiting.  Endocrine: Negative.  Genitourinary: Negative. Negative for dysuria.  Musculoskeletal: Positive for arthralgias. Negative for joint swelling.  Skin: Negative. Negative for rash.  Allergic/Immunologic: Negative.  Neurological: Negative. Negative for headaches.  Hematological: Bruises/bleeds easily.  Psychiatric/Behavioral: Negative. Negative for dysphoric mood. The patient is not nervous/anxious.  All other  systems reviewed and are negative.  Past Medical History  Diagnosis Date  . Angina at rest [facility]) 02/27/2011  . CAD (coronary artery disease) 02/27/2011    Catheterized on 02/28/11, graft supplying marginal vessel was occluded, after 48 hours of hydration, a repeat cath was performed in an attempt at PCI 03/02/11 cath had an unsuccessful attempt at PCI to an occluded calcified circumflex vessel, Residual distal tip of the ChoICE PT moderate support wire at the subtotal occlusion. No change in the subtotal occlusion,improved retrograde collateralization  . LV dysfunction 02/27/2011  . CKD (chronic kidney disease) stage 3, GFR 30-59 ml/min 02/27/2011  . LBBB (left bundle branch block) 02/27/2011  . Hypertension   . Hyperlipemia 02/27/2011  . Diabetes mellitus 02/27/2011    insulin dependent  . GERD (gastroesophageal reflux disease)   . CHF (congestive heart failure) (HCC)   . Blood transfusion     no reaction to transfusion per patient  . Arthritis   . Myocardial infarction (HCC)   . PVD (peripheral vascular disease) (HCC)     Carotid Angiography was performed in 2007, no treatment delivered at that time. Right carotid endarterectomy in 2009, being followed by duplex ultrasound. Doppler remains stable.  [name] CAD (coronary artery disease)     PTCA of the right coronary artery in Feb. 1987, July 1987, and June 1990. Circumflex PTCA in July 1993, followed by a RCA, PTCA in Sept. 1994. CABG in 1994x5 with a LIMA to the LAD, vein graft to the first diagonal, vein graft to the marginal and left circumflex coronary artery, and sequential graft to the PDA and PLA branches of the RCA  . HTN (hypertension)  Renal dopplers have been performed on a semi annual basis. Left renal artery demonstrated vessel narrowing with elevated velocities consistent with a 1-59% diameter reduction.   . carotid doppler     Left ECA : The proximal most aspect  of the left external carotid artery demonstrates a peak systolic velocity of 493.9 cm/s. This is consistent with a 70-99% significant stenosis correlated with a 2007 angiogram.  . Paroxysmal atrial fibrillation (HCC)    (-) CA, DVT  Family History  Problem Relation Age of Onset  . Cancer Mother     unknown to pt  . Cancer Father     pancreas  . Cancer Paternal Grandfather     prostate     Past Surgical History  Procedure Laterality Date  . Coronary artery bypass graft  1994  . Spinal fusion    . Back surgery    . Carotid endarterectomy  2009  . Myoview scan  03/30/2011  . Left heart catheterization with coronary/graft angiogram N/A 02/28/2011    Procedure: LEFT HEART CATHETERIZATION WITH Isabel Caprice; Surgeon: Lennette Bihari, MD; Location: [facility] CATH LAB; Service: Cardiovascular; Laterality: N/A;  . Percutaneous coronary stent intervention (pci-s) N/A 03/02/2011    Procedure: PERCUTANEOUS CORONARY STENT INTERVENTION (PCI-S); Surgeon: Lennette Bihari, MD; Location: [facility] CATH LAB; Service: Cardiovascular; Laterality: N/A;    Social History   Social History  . Marital Status: Married    Spouse Name: N/A  . Number of Children: N/A  . Years of Education: N/A   Occupational History  . Not on file.   Social History Main Topics  . Smoking status: Former Smoker -- 22 years    Quit date: 03/26/1958  . Smokeless tobacco: Never Used     Comment: quit smoking in the 1960's  . Alcohol Use: No  . Drug Use: No  . Sexual Activity: Yes   Other Topics Concern  . Not on file   Social History Narrative   worked at a cigarette factory.   Allergies  Allergen Reactions  . Lisinopril Cough     Outpatient Prescriptions Prior to Visit  Medication Sig Dispense Refill  . amLODipine (NORVASC) 10 MG tablet Take 10 mg by mouth daily.      [name] apixaban (ELIQUIS) 2.5 MG TABS tablet Take 2 tablets (5 mg total) by mouth 2 (two) times daily. (Patient taking differently: Take 2.5 mg by mouth 2 (two) times daily. ) 180 tablet 3  . aspirin 81 MG tablet Take 81 mg by mouth daily.    . carvedilol (COREG) 25 MG tablet TAKE 1&1/2 TABLETS BY MOUTH 2 TIMES DAILY WITH MEALS 270 tablet 3  . cholecalciferol (VITAMIN D) 1000 UNITS tablet Take 1,000 Units by mouth daily.    . diphenhydrAMINE (BENADRYL) 25 MG tablet Take 25 mg by mouth daily.    . ferrous sulfate 325 (65 FE) MG tablet Take 325 mg by mouth daily with breakfast.    . FOLIC ACID PO Take 600 mcg by mouth daily.    . furosemide (LASIX) 20 MG tablet Take 20 mg by mouth 2 (two) times daily.    [name] glipiZIDE (GLUCOTROL) 10 MG tablet Take 20 mg by mouth 2 (two) times daily.     . Glucosamine-Chondroitin (MOVE FREE PO) Take 1 tablet by mouth 2 (two) times daily.     . hydrALAZINE (APRESOLINE) 50 MG tablet Take 1 tablet (50 mg total) by mouth 2 (two) times daily. 180 tablet 1  . isosorbide mononitrate (IMDUR) 60 MG  24 hr tablet Take 60 mg by mouth daily.    . Multiple Vitamins-Minerals (MULTIVITAMINS THER. W/MINERALS) TABS Take 1 tablet by mouth daily.     . nitroGLYCERIN (NITROSTAT) 0.4 MG SL tablet Place 0.4 mg under the tongue every 5 (five) minutes as needed for chest pain.    [name] omeprazole (PRILOSEC) 20 MG capsule Take 20 mg by mouth 2 (two) times daily.     . pravastatin (PRAVACHOL) 40 MG tablet Take 40 mg by mouth at bedtime.     [name] acetaminophen (TYLENOL) 325 MG tablet Take 650 mg by mouth every 6 (six) hours as needed for pain.     No facility-administered medications prior to visit.   Meds ordered this encounter  Medications  . temazepam (RESTORIL) 15 MG capsule    Sig: Take 15 mg by mouth at bedtime as needed for sleep.  . budesonide-formoterol (SYMBICORT) 160-4.5 MCG/ACT inhaler      Sig: Inhale 2 puffs into the lungs 2 (two) times daily.    Dispense: 1 Inhaler    Refill: 6  . albuterol (PROVENTIL HFA;VENTOLIN HFA) 108 (90 Base) MCG/ACT inhaler    Sig: Inhale 2 puffs into the lungs every 6 (six) hours as needed for wheezing or shortness of breath.    Dispense: 1 Inhaler    Refill: 6          Objective:   Physical Exam   Vitals:  Filed Vitals:   05/04/15 1101  BP: 128/58  Pulse: 55  Height:  (1.854 m)  Weight: 194 lb 9.6 oz (88.27 kg)  SpO2: 92%   Filed Vitals:   05/06/15 1335 05/06/15 1336  BP: 114/58   Pulse: 54   Weight:  194 lb 6.4 oz (88.179 kg)  SpO2: 97%      Constitutional/General: Pleasant, well-nourished, well-developed, not in any distress, Comfortably seating.  Well kempt  Body mass index is 25.68 kg/(m^2).  HEENT: Pupils equal and reactive to light and accommodation. Anicteric sclerae. Normal nasal mucosa.  No oral lesions, mouth clear, oropharynx clear, no postnasal drip. (-) Oral thrush. No dental caries.  Airway - Mallampati class III-IV  Neck: No masses. Midline trachea. No JVD, (-) LAD. (-) bruits appreciated.  Respiratory/Chest: Grossly normal chest. (-) deformity. (-) Accessory muscle use.  Symmetric expansion. (-) Tenderness on palpation.  Resonant on percussion.  Diminished BS on both lower lung zones. (-) wheezing, rhonchi Bibasilar crackles. (-) egophony  Cardiovascular: Regular rate and rhythm, heart sounds normal, no murmur or gallops, no peripheral edema  Gastrointestinal: Normal bowel sounds. Soft, non-tender. No hepatosplenomegaly.  (-) masses.   Musculoskeletal: Normal muscle tone. Normal gait.   Extremities: Grossly normal. (-) clubbing, cyanosis.  (-) edema  Skin: (-) rash,lesions seen.   Neurological/Psychiatric : alert, oriented to time, place, person. Normal mood and affect       Assessment & Plan:  COPD (chronic  obstructive pulmonary disease) with emphysema (HCC) 20PY smoking history, quit in 1965. No known lung dse. CXR in 2012 showed COPD changes.  Recent SOB. Has cardiac issues with seem stable. Likely with moderate COPD. Plan: 1. Needs PFT 2. Start symbicort 160/4.5 2P BID 3. Start alb HFA prn 4. Need to observe HR on MDIs. Has afib -- HR in 60s. 5. May have chronic bronchitis as well. May need to start daliresp. 6. UTD with flu and pna vaccine.   COUGH Pt with chronic cough but seems worse since aug 2016. Has PND and frequent cleariing of throat. Denies other  allergies. May have CB. Has GERD but stable.  Plan: 1. Start flonase. Will use his wifes. 2. May need singulair, zyrtec. 3. Rx GERD. 4. May need daliresp.   Abnormal chest x-ray Pt had bronchitis with the "wicked virus" in aug 2016 for which he ended up on doxy x 4-5 weeks. CXR in 02/2015 -- B infiltrates. ? Heart failure, ? ILD.  CXR in early 2016 -- COPD changes CXR in 2012 -- COPD changes. Need to determine if pt has ILD or non resolving PNA. Plan: 1. Needs chest ct scan, no contrats. (Has st 2 CKD).   Dyspnea on exertion Pt with CAD, CHF EF 45-50. Follows with cardiology. Sees Dr. Allyson Sabal and Kathi Der.  He does not seem to be in acute exacer of CHF. Plan: 1. Cont lasix. 2. rx copd. 3. Needs chest ct scan.      Thank you very much for letting me participate in this patient's care. Please do not hesitate to give me a call if you have any questions or concerns regarding the treatment plan.   Patient will follow up with me in 1 month.     Lance Sell A. Christene Slates, MD Pulmonary and Critical Care Medicine [facility] [phone], Fax: [phone]

## 2015-05-06 NOTE — Assessment & Plan Note (Addendum)
Pt with CAD, CHF EF 45-50. Follows with cardiology. Sees Dr. Allyson Sabal and Kathi Der.  He does not seem to be in acute exacerbation of CHF.

## 2015-05-06 NOTE — Assessment & Plan Note (Addendum)
20PY smoking history, quit in 1965. No known lung dse. CXR in 2012 showed COPD changes.  Chest CT scan (04/2015)  Mild COPD changes.  Recent SOB. Has cardiac issues with seem stable. Likely with moderate COPD. Plan: 1. Needs PFT 2. Start/Cont symbicort 160/4.5 2P  BID. I doubt he is using this. I encouraged him to use it for 1-2 weeks and see if there is a difference.  3. Cont alb HFA prn 4. Need to observe HR on MDIs. Has afib -- HR in 60s. 5. May have chronic bronchitis as well. May need to start daliresp. 6. UTD with flu and pna vaccine.

## 2015-05-06 NOTE — Assessment & Plan Note (Addendum)
Pt with CAD, CHF EF 45-50. Follows with cardiology. Sees Dr. Allyson Sabal and Kathi Der.  He does not seem to be in acute exacer of CHF. CXR (03/2015)  Cardiomegaly, bibasilar infiltrates (subtle) -- not sure if related to CHF/edema.  Plan was to get Chest ct scan. Chest ct scan obtained -- pls see separate discussion.

## 2015-05-06 NOTE — Patient Instructions (Signed)
You have Lymph Nodes in your lungs. We will schedule you for a PET Scan.  We will call you with PET Scan results.     Return to clinic in as scheduled  J. Angelo A. Christene Slates, MD Pulmonary and Critical Care Medicine [facility] [phone], Fax: [phone]

## 2015-05-06 NOTE — Progress Notes (Signed)
Subjective:    Patient ID: [name], male    DOB: May 03, 1932, 82 y.o.   MRN: [id number]  HPI Pt is here to discuss results of chest ct scan.  Pt was seen this week as a consult regarding dyspnea and cough. Since that time, he has slowly improved with regards to his cough. Uses flonase. I started Symbicort for SOB (i though he might have COPD) -- but I doubt he is using Symbicort. No issues since last seen.  No Abx.   Review of Systems  Constitutional: Negative.  Negative for fever, fatigue and unexpected weight change.       No changes in weight  HENT: Positive for postnasal drip. Negative for congestion, dental problem, ear pain, nosebleeds, rhinorrhea, sinus pressure, sneezing, sore throat and trouble swallowing.        Cough and congestion better  Eyes: Negative.  Negative for redness and itching.  Respiratory: Positive for cough and shortness of breath. Negative for choking, chest tightness and wheezing.   Cardiovascular: Negative.  Negative for chest pain, palpitations and leg swelling.  Gastrointestinal: Negative.  Negative for nausea and vomiting.  Endocrine: Negative.   Genitourinary: Negative.  Negative for dysuria.  Musculoskeletal: Positive for arthralgias. Negative for joint swelling.  Skin: Negative.  Negative for rash.  Allergic/Immunologic: Negative.   Neurological: Negative.  Negative for headaches.  Hematological: Negative.  Does not bruise/bleed easily.  Psychiatric/Behavioral: Negative.  Negative for dysphoric mood. The patient is not nervous/anxious.   All other systems reviewed and are negative.  Past Medical History  Diagnosis Date  . Angina at rest [facility]) 02/27/2011  . CAD (coronary artery disease) 02/27/2011    Catheterized on 02/28/11, graft supplying marginal vessel was occluded, after 48 hours of hydration, a repeat cath was performed in an attempt at PCI 03/02/11 cath had an unsuccessful attempt at PCI to an occluded calcified circumflex vessel,  Residual distal tip of the ChoICE PT moderate support wire at the subtotal occlusion. No change in the subtotal occlusion,improved retrograde collateralization  . LV dysfunction 02/27/2011  . CKD (chronic kidney disease) stage 3, GFR 30-59 ml/min 02/27/2011  . LBBB (left bundle branch block) 02/27/2011  . Hypertension   . Hyperlipemia 02/27/2011  . Diabetes mellitus 02/27/2011    insulin dependent  . GERD (gastroesophageal reflux disease)   . CHF (congestive heart failure) (HCC)   . Blood transfusion     no reaction to transfusion per patient  . Arthritis   . Myocardial infarction (HCC)   . PVD (peripheral vascular disease) (HCC)     Carotid Angiography was performed in 2007, no treatment delivered at that time. Right carotid endarterectomy in 2009, being followed by duplex ultrasound. Doppler remains stable.  [name] CAD (coronary artery disease)     PTCA of the right coronary artery in Feb. 1987, July 1987, and June 1990. Circumflex PTCA in July 1993, followed by a RCA, PTCA in Sept. 1994. CABG in 1994x5 with a LIMA to the LAD, vein graft to the first diagonal, vein graft to the marginal and left circumflex coronary artery, and sequential graft to the PDA and PLA branches of the RCA  . HTN (hypertension)     Renal dopplers have been performed on a semi annual basis. Left renal artery demonstrated vessel narrowing with elevated velocities consistent with a 1-59% diameter reduction.   . carotid doppler     Left ECA : The proximal most aspect of the left external carotid artery demonstrates a peak systolic  velocity of 493.9 cm/s. This is consistent with a 70-99% significant stenosis correlated with a 2007 angiogram.  . Paroxysmal atrial fibrillation (HCC)    (-) CA, DVT  Family History  Problem Relation Age of Onset  . Cancer Mother     unknown to pt  . Cancer Father     pancreas  . Cancer Paternal Grandfather     prostate     Past Surgical History  Procedure Laterality Date  . Coronary  artery bypass graft  1994  . Spinal fusion    . Back surgery    . Carotid endarterectomy  2009  . Myoview scan  03/30/2011  . Left heart catheterization with coronary/graft angiogram N/A 02/28/2011    Procedure: LEFT HEART CATHETERIZATION WITH Isabel Caprice;  Surgeon: Lennette Bihari, MD;  Location: [facility] CATH LAB;  Service: Cardiovascular;  Laterality: N/A;  . Percutaneous coronary stent intervention (pci-s) N/A 03/02/2011    Procedure: PERCUTANEOUS CORONARY STENT INTERVENTION (PCI-S);  Surgeon: Lennette Bihari, MD;  Location: [facility] CATH LAB;  Service: Cardiovascular;  Laterality: N/A;    Social History   Social History  . Marital Status: Married    Spouse Name: N/A  . Number of Children: N/A  . Years of Education: N/A   Occupational History  . Not on file.   Social History Main Topics  . Smoking status: Former Smoker -- 22 years    Quit date: 03/26/1958  . Smokeless tobacco: Never Used     Comment: quit smoking in the 1960's  . Alcohol Use: No  . Drug Use: No  . Sexual Activity: Yes   Other Topics Concern  . Not on file   Social History Narrative   worked at a cigarette factory.   Allergies  Allergen Reactions  . Lisinopril Cough     Outpatient Prescriptions Prior to Visit  Medication Sig Dispense Refill  . acetaminophen (TYLENOL) 325 MG tablet Take 650 mg by mouth every 6 (six) hours as needed for pain.    [name] albuterol (PROVENTIL HFA;VENTOLIN HFA) 108 (90 Base) MCG/ACT inhaler Inhale 2 puffs into the lungs every 6 (six) hours as needed for wheezing or shortness of breath. 1 Inhaler 6  . amLODipine (NORVASC) 10 MG tablet Take 10 mg by mouth daily.      [name] apixaban (ELIQUIS) 2.5 MG TABS tablet Take 2 tablets (5 mg total) by mouth 2 (two) times daily. (Patient taking differently: Take 2.5 mg by mouth 2 (two) times daily. ) 180 tablet 3  . aspirin 81 MG tablet Take 81 mg by mouth daily.    . budesonide-formoterol (SYMBICORT) 160-4.5 MCG/ACT inhaler Inhale 2 puffs into the  lungs 2 (two) times daily. 1 Inhaler 6  . carvedilol (COREG) 25 MG tablet TAKE 1&1/2 TABLETS BY MOUTH 2 TIMES DAILY WITH MEALS 270 tablet 3  . cholecalciferol (VITAMIN D) 1000 UNITS tablet Take 1,000 Units by mouth daily.    . diphenhydrAMINE (BENADRYL) 25 MG tablet Take 25 mg by mouth daily.    . ferrous sulfate 325 (65 FE) MG tablet Take 325 mg by mouth daily with breakfast.    . FOLIC ACID PO Take 600 mcg by mouth daily.    . furosemide (LASIX) 20 MG tablet Take 20 mg by mouth 2 (two) times daily.    [name] glipiZIDE (GLUCOTROL) 10 MG tablet Take 20 mg by mouth 2 (two) times daily.     . Glucosamine-Chondroitin (MOVE FREE PO) Take 1 tablet by mouth 2 (two) times daily.     [name]  hydrALAZINE (APRESOLINE) 50 MG tablet Take 1 tablet (50 mg total) by mouth 2 (two) times daily. 180 tablet 1  . isosorbide mononitrate (IMDUR) 60 MG 24 hr tablet Take 60 mg by mouth daily.    . Multiple Vitamins-Minerals (MULTIVITAMINS THER. W/MINERALS) TABS Take 1 tablet by mouth daily.      . nitroGLYCERIN (NITROSTAT) 0.4 MG SL tablet Place 0.4 mg under the tongue every 5 (five) minutes as needed for chest pain.    [name] omeprazole (PRILOSEC) 20 MG capsule Take 20 mg by mouth 2 (two) times daily.     . pravastatin (PRAVACHOL) 40 MG tablet Take 40 mg by mouth at bedtime.      . temazepam (RESTORIL) 15 MG capsule Take 15 mg by mouth at bedtime as needed for sleep.     No facility-administered medications prior to visit.   No orders of the defined types were placed in this encounter.          Objective:   Physical Exam    Vitals:  Filed Vitals:   05/06/15 1335 05/06/15 1336  BP: 114/58   Pulse: 54   Weight:  194 lb 6.4 oz (88.179 kg)  SpO2: 97%     Constitutional/General:  Pleasant, well-nourished, well-developed, not in any distress,  Comfortably seating.  Well kempt  Body mass index is 25.65 kg/(m^2).  HEENT: Pupils equal and reactive to light and accommodation. Anicteric sclerae. Normal nasal mucosa.   No  oral  lesions,  mouth clear,  oropharynx clear, no postnasal drip. (-) Oral thrush. No dental caries.  Airway - Mallampati class III-IV  Neck: No masses. Midline trachea. No JVD, (-) LAD. (-) bruits appreciated.  Respiratory/Chest: Grossly normal chest. (-) deformity. (-) Accessory muscle use.  Symmetric expansion. (-) Tenderness on palpation.  Resonant on percussion.  Diminished BS on both lower lung zones. (-) wheezing, rhonchi Bibasilar crackles. (-) egophony  Cardiovascular: Regular rate and  rhythm, heart sounds normal, no murmur or gallops, no peripheral edema  Gastrointestinal:  Normal bowel sounds. Soft, non-tender. No hepatosplenomegaly.  (-) masses.   Musculoskeletal:  Normal muscle tone. Normal gait.   Extremities: Grossly normal. (-) clubbing, cyanosis.  (-) edema  Skin: (-) rash,lesions seen.   Neurological/Psychiatric : alert, oriented to time, place, person. Normal mood and affect       Assessment & Plan:  COPD (chronic obstructive pulmonary disease) with emphysema (HCC) 20PY smoking history, quit in 1965. No known lung dse. CXR in 2012 showed COPD changes.  Chest CT scan (04/2015)  Mild COPD changes.  Recent SOB. Has cardiac issues with seem stable. Likely with moderate COPD. Plan: 1. Needs PFT 2. Start/Cont symbicort 160/4.5 2P  BID. I doubt he is using this. I encouraged him to use it for 1-2 weeks and see if there is a difference.  3. Cont alb HFA prn 4. Need to observe HR on MDIs. Has afib -- HR in 60s. 5. May have chronic bronchitis as well. May need to start daliresp. 6. UTD with flu and pna vaccine.     Cough Pt with chronic cough but seems worse since aug 2016. Has PND and frequent cleariing of throat. Denies other allergies. May have CB. Has GERD but stable.  Plan: 1. Cont flonase. 2. May need singulair, zyrtec. 3. Rx GERD. 4. May need daliresp.     Dyspnea on exertion Pt with CAD, CHF EF 45-50. Follows with cardiology. Sees  Dr. Allyson Sabal and Kathi Der.  He does not seem to be  in acute exacerbation of CHF.   Abnormal chest x-ray Pt with CAD, CHF EF 45-50. Follows with cardiology. Sees Dr. Allyson Sabal and Kathi Der.  He does not seem to be in acute exacer of CHF. CXR (03/2015)  Cardiomegaly, bibasilar infiltrates (subtle) -- not sure if related to CHF/edema.  Plan was to get Chest ct scan. Chest ct scan obtained -- pls see separate discussion.    Lymphadenopathy, mediastinal 30PY smoking history, quit in 1965. (-) H/O CA. Daughter has Sarcoidosis and is severe per pt.  Chest ct scan (04/2015) -- 2.3 cm 4R node/ R paratracheal LN, 1.7 cm subcarinal LN, mild mediastinal LN. No contrast as pt with CKD (Creat in 2). Differentials: Inflammatory, Infectious, Malignancy. Could be related to previous infxn late last yr. LN could have been bigger and worse but no previous CT scan.  Plan : 1. I discussed with pt and wife my plan and my differentials. 2. I mentioned about Bronch with Bx with EBUS. Mentioned pt has inc risk and cx given age and co-morbidities. Has CAD, Afib, CKD. On Eliquis. Will need Cards evaln prior to bronch.  3. I mentioned about doing PET scan. If PET is (-), we can be conservative and serially get chest ct scan.  If (+) PET, pt will need bronch with bx and ebus.  Mentioned that there are CA with (-) PET but not as common. If (-) PET -- rpt chest ct scan in 3 mos.  4. Pt and wife agreed to do PET scan first and go from there.  5. We will schedule pet scan and call up pt after it is done.    F/U after PET scan.    Lance Sell A. Christene Slates, MD Pulmonary and Critical Care Medicine [facility] [phone], Fax: [phone]

## 2015-05-06 NOTE — Telephone Encounter (Signed)
It's OK to hold off as long as he is feeling OK. He can discuss increasing his Lasix with Dr Arrie Aran. We'll know more after his chest CT, echo, and PFTs. If he gets worse he should call back.  The blood test does indicate a degree of CHF.  Joshuah Minella PA-C 05/06/2015 8:00 AM

## 2015-05-06 NOTE — Telephone Encounter (Signed)
Left message for pt to call.

## 2015-05-07 DIAGNOSIS — R590 Localized enlarged lymph nodes: Secondary | ICD-10-CM | POA: Insufficient documentation

## 2015-05-07 NOTE — Assessment & Plan Note (Addendum)
30PY smoking history, quit in 1965. (-) H/O CA. Daughter has Sarcoidosis and is severe per pt.  Chest ct scan (04/2015) -- 2.3 cm 4R node/ R paratracheal LN, 1.7 cm subcarinal LN, mild mediastinal LN. No contrast as pt with CKD (Creat in 2). Differentials: Inflammatory, Infectious, Malignancy. Could be related to previous infxn late last yr. LN could have been bigger and worse but no previous CT scan.  Plan : 1. I discussed with pt and wife my plan and my differentials. 2. I mentioned about Bronch with Bx with EBUS. Mentioned pt has inc risk and cx given age and co-morbidities. Has CAD, Afib, CKD. On Eliquis. Will need Cards evaln prior to bronch.  3. I mentioned about doing PET scan. If PET is (-), we can be conservative and serially get chest ct scan.  If (+) PET, pt will need bronch with bx and ebus.  Mentioned that there are CA with (-) PET but not as common. If (-) PET -- rpt chest ct scan in 3 mos.  4. Pt and wife agreed to do PET scan first and go from there.  5. We will schedule pet scan and call up pt after it is done.

## 2015-05-09 NOTE — Telephone Encounter (Signed)
Pt is calling you back . Please f/u with him  Thanks

## 2015-05-09 NOTE — Telephone Encounter (Signed)
Spoke with pt, he did increase his furosemide Saturday and Sunday. Patient voiced understanding to have labs drawn [address] location on [address] when having echo.

## 2015-05-17 ENCOUNTER — Other Ambulatory Visit: Payer: Self-pay | Admitting: Pulmonary Disease

## 2015-05-17 ENCOUNTER — Encounter (HOSPITAL_COMMUNITY)
Admission: RE | Admit: 2015-05-17 | Discharge: 2015-05-17 | Disposition: A | Discharge status: Home / Self Care | Payer: Medicare Other | Source: Ambulatory Visit | Attending: Pulmonary Disease | Admitting: Pulmonary Disease

## 2015-05-17 DIAGNOSIS — R591 Generalized enlarged lymph nodes: Secondary | ICD-10-CM | POA: Diagnosis not present

## 2015-05-17 DIAGNOSIS — J438 Other emphysema: Secondary | ICD-10-CM

## 2015-05-17 LAB — GLUCOSE, CAPILLARY: GLUCOSE-CAPILLARY: 87 mg/dL (ref 65–99)

## 2015-05-17 MED ORDER — FLUDEOXYGLUCOSE F - 18 (FDG) INJECTION
8.5200 | Freq: Once | INTRAVENOUS | Status: AC | PRN
  Administered 2015-05-17: 8.52 via INTRAVENOUS

## 2015-05-17 NOTE — Progress Notes (Signed)
Plan to rpt chect ct scan in 3 mos. // per Dr Christene Slates

## 2015-05-18 ENCOUNTER — Other Ambulatory Visit: Payer: Self-pay

## 2015-05-18 ENCOUNTER — Ambulatory Visit (HOSPITAL_COMMUNITY): Payer: Medicare Other | Attending: Cardiology

## 2015-05-18 ENCOUNTER — Other Ambulatory Visit (INDEPENDENT_AMBULATORY_CARE_PROVIDER_SITE_OTHER): Payer: Medicare Other | Admitting: *Deleted

## 2015-05-18 DIAGNOSIS — R0609 Other forms of dyspnea: Secondary | ICD-10-CM

## 2015-05-18 DIAGNOSIS — R7989 Other specified abnormal findings of blood chemistry: Secondary | ICD-10-CM

## 2015-05-18 DIAGNOSIS — I119 Hypertensive heart disease without heart failure: Secondary | ICD-10-CM | POA: Insufficient documentation

## 2015-05-18 DIAGNOSIS — R799 Abnormal finding of blood chemistry, unspecified: Secondary | ICD-10-CM | POA: Diagnosis not present

## 2015-05-18 DIAGNOSIS — E785 Hyperlipidemia, unspecified: Secondary | ICD-10-CM | POA: Diagnosis not present

## 2015-05-18 DIAGNOSIS — I358 Other nonrheumatic aortic valve disorders: Secondary | ICD-10-CM | POA: Diagnosis not present

## 2015-05-18 DIAGNOSIS — R0600 Dyspnea, unspecified: Secondary | ICD-10-CM | POA: Diagnosis present

## 2015-05-18 LAB — BASIC METABOLIC PANEL
BUN: 54 mg/dL — ABNORMAL HIGH (ref 7–25)
CO2: 20 mmol/L (ref 20–31)
Calcium: 8.8 mg/dL (ref 8.6–10.3)
Chloride: 102 mmol/L (ref 98–110)
Creat: 2.39 mg/dL — ABNORMAL HIGH (ref 0.70–1.11)
Glucose, Bld: 306 mg/dL — ABNORMAL HIGH (ref 65–99)
Potassium: 4.5 mmol/L (ref 3.5–5.3)
Sodium: 136 mmol/L (ref 135–146)

## 2015-05-18 LAB — BRAIN NATRIURETIC PEPTIDE: Brain Natriuretic Peptide: 623.5 pg/mL — ABNORMAL HIGH (ref <100)

## 2015-06-01 ENCOUNTER — Telehealth: Payer: Self-pay | Admitting: Cardiovascular Disease

## 2015-06-01 ENCOUNTER — Encounter: Payer: Self-pay | Admitting: Cardiovascular Disease

## 2015-06-01 ENCOUNTER — Ambulatory Visit (INDEPENDENT_AMBULATORY_CARE_PROVIDER_SITE_OTHER): Payer: Medicare Other | Admitting: Cardiovascular Disease

## 2015-06-01 VITALS — BP 128/56 | HR 72 | Ht 70.0 in | Wt 184.0 lb

## 2015-06-01 DIAGNOSIS — R0609 Other forms of dyspnea: Secondary | ICD-10-CM

## 2015-06-01 DIAGNOSIS — E785 Hyperlipidemia, unspecified: Secondary | ICD-10-CM | POA: Diagnosis not present

## 2015-06-01 DIAGNOSIS — I2510 Atherosclerotic heart disease of native coronary artery without angina pectoris: Secondary | ICD-10-CM

## 2015-06-01 DIAGNOSIS — I480 Paroxysmal atrial fibrillation: Secondary | ICD-10-CM | POA: Diagnosis not present

## 2015-06-01 DIAGNOSIS — I2583 Coronary atherosclerosis due to lipid rich plaque: Secondary | ICD-10-CM

## 2015-06-01 DIAGNOSIS — I10 Essential (primary) hypertension: Secondary | ICD-10-CM

## 2015-06-01 DIAGNOSIS — I255 Ischemic cardiomyopathy: Secondary | ICD-10-CM | POA: Diagnosis not present

## 2015-06-01 NOTE — Telephone Encounter (Signed)
Spoke to wife  Appointment schedule for today at 3:30 pm with DR Allyson SabalBERRY

## 2015-06-01 NOTE — Progress Notes (Signed)
06/01/2015 [name]   Jul 12, 1932  [id number]  Primary Physician [name] Blamer, MD Primary Cardiologist: [name] MD [name]   HPI:  The patient is a very pleasant 82 year old, mildly overweight, married Caucasian male, father of 2, grandfather to 1 grandchild who I last saw 08/27/14.[name] He has a history of CAD status post coronary artery bypass grafting in 1994. He also has hypertension, hyperlipidemia, insulin-dependent diabetes and peripheral vascular disease. He had right carotid endarterectomy back in 2009 and has been followed by duplex ultrasound here in our office, most recently July of last year. He has mild renal insufficiency. His creatinines run in the 2 range. He was cath'd by Dr. Tresa Endo March 02, 2011, with a failed attempt at native circumflex recanalization complicated by fractured wire tip. His LIMA to his LAD was patent, as was the vein to a diagonal branch and a vein to the PDA and PLA sequentially. The vein to the marginal branch was occluded. His EF was 35% to 40% with anteroapical hypokinesia. He is totally asymptomatic. His Myoview performed March 30, 2011, showed mild lateral ischemia explainable by his anatomy. He does complain of some abdominal pain which apparently may be related to a hernia which she is seeing a Development worker, international aid for. Recent lab work performed in December revealed total cholesterol 127, LDL of 61 and HDL 38. He saw [name] PAC back in the office 12/09/12. His medicines were adjusted. He had a small bowel obstruction in October was treated with decompression. Apparently at that time his ejection fraction was increased by 2-D echo.   Because of episodes of PAF down on monitoring he was begun on Eliquis oral anti-coagulation at reduced dose for renal insufficiency. He is on chronic Lasix because of class 2-3 heart failure with an EF of 35-40%. He runs creatinines in the low 2 range and is followed by Dr. [name]. He was  recently seen in the emergency room by Dr. [name] 2 days ago but responsive angina. His enzymes were negative.  He saw [name] in the office 05/03/15. He has been complaining of dyspnea. This has been somewhat responsive the Symbicort. His BMP level had actually declined. I do not think his symptoms aren't necessarily cardiac in nature.   Current Outpatient Prescriptions  Medication Sig Dispense Refill  . acetaminophen (TYLENOL) 325 MG tablet Take 650 mg by mouth every 6 (six) hours as needed for pain.    [name] albuterol (PROVENTIL HFA;VENTOLIN HFA) 108 (90 Base) MCG/ACT inhaler Inhale 2 puffs into the lungs every 6 (six) hours as needed for wheezing or shortness of breath. 1 Inhaler 6  . amLODipine (NORVASC) 10 MG tablet Take 10 mg by mouth daily.      [name] apixaban (ELIQUIS) 2.5 MG TABS tablet Take 2 tablets (5 mg total) by mouth 2 (two) times daily. (Patient taking differently: Take 2.5 mg by mouth 2 (two) times daily. ) 180 tablet 3  . aspirin 81 MG tablet Take 81 mg by mouth daily.    . budesonide-formoterol (SYMBICORT) 160-4.5 MCG/ACT inhaler Inhale 2 puffs into the lungs 2 (two) times daily. 1 Inhaler 6  . carvedilol (COREG) 25 MG tablet TAKE 1&1/2 TABLETS BY MOUTH 2 TIMES DAILY WITH MEALS 270 tablet 3  . cholecalciferol (VITAMIN D) 1000 UNITS tablet Take 1,000 Units by mouth daily.    . diphenhydrAMINE (BENADRYL) 25 MG tablet Take 25 mg by mouth daily.    . ferrous sulfate 325 (65 FE) MG tablet Take  325 mg by mouth daily with breakfast.    . FOLIC ACID PO Take 600 mcg by mouth daily.    . furosemide (LASIX) 20 MG tablet Take 20 mg by mouth 2 (two) times daily.    [name]. glipiZIDE (GLUCOTROL) 10 MG tablet Take 20 mg by mouth 2 (two) times daily.     . Glucosamine-Chondroitin (MOVE FREE PO) Take 1 tablet by mouth 2 (two) times daily.     . hydrALAZINE (APRESOLINE) 50 MG tablet Take 1 tablet (50 mg total) by mouth 2 (two) times daily. 180 tablet 1  . isosorbide mononitrate (IMDUR) 60 MG 24 hr tablet  Take 60 mg by mouth daily.    . Multiple Vitamins-Minerals (MULTIVITAMINS THER. W/MINERALS) TABS Take 1 tablet by mouth daily.      . nitroGLYCERIN (NITROSTAT) 0.4 MG SL tablet Place 0.4 mg under the tongue every 5 (five) minutes as needed for chest pain.    [name]. omeprazole (PRILOSEC) 20 MG capsule Take 20 mg by mouth 2 (two) times daily.     . pravastatin (PRAVACHOL) 40 MG tablet Take 40 mg by mouth at bedtime.      . temazepam (RESTORIL) 15 MG capsule Take 15 mg by mouth at bedtime as needed for sleep.     No current facility-administered medications for this visit.    Allergies  Allergen Reactions  . Lisinopril Cough    Social History   Social History  . Marital Status: Married    Spouse Name: N/A  . Number of Children: N/A  . Years of Education: N/A   Occupational History  . Not on file.   Social History Main Topics  . Smoking status: Former Smoker -- 22 years    Quit date: 03/26/1958  . Smokeless tobacco: Never Used     Comment: quit smoking in the 1960's  . Alcohol Use: No  . Drug Use: No  . Sexual Activity: Yes   Other Topics Concern  . Not on file   Social History Narrative     Review of Systems: General: negative for chills, fever, night sweats or weight changes.  Cardiovascular: negative for chest pain, dyspnea on exertion, edema, orthopnea, palpitations, paroxysmal nocturnal dyspnea or shortness of breath Dermatological: negative for rash Respiratory: negative for cough or wheezing Urologic: negative for hematuria Abdominal: negative for nausea, vomiting, diarrhea, bright red blood per rectum, melena, or hematemesis Neurologic: negative for visual changes, syncope, or dizziness All other systems reviewed and are otherwise negative except as noted above.    Blood pressure 128/56, pulse 72, height 5\' 10"  (1.778 m), weight 184 lb (83.462 kg).  General appearance: alert and no distress Neck: no adenopathy, no JVD, supple, symmetrical, trachea midline,  thyroid not enlarged, symmetric, no tenderness/mass/nodules and bilateral carotid bruits left greater than right Lungs: clear to auscultation bilaterally Heart: regular rate and rhythm, S1, S2 normal, no murmur, click, rub or gallop Extremities: extremities normal, atraumatic, no cyanosis or edema  EKG not performed today  ASSESSMENT AND PLAN:   Essential hypertension History of hypertension blood pressure measured at 128/56. He is on amlodipine, carvedilol. Continue current meds at current dosing  CAD - CABG '94. 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 He and/or chordae Physician by Dr. Nicholaus BloomKelley 03/02/11 with failed attempt at circumflex recanalization, gated by Rotablator wire tip fracture. He's had a Myoview stress test performed 03/30/11 that showed mild lateral ischemia is spinal by his  anatomy. She denies chest pain. His ejection fraction remains in the 35-40% range.  Hyperlipemia History of hyperlipidemia on pravastatin followed by his PCP  PAF (paroxysmal atrial fibrillation) (HCC) History of paroxysmal atrial ablation maintaining sinus rhythm on low-dose  Eliquis  Dyspnea on exertion History of dyspnea on exertion with known systolic heart failure. His recent BNP performed several weeks ago at the request of [name] revealed a decline for approximately [phone]. He does not appear to be volume overloaded      [name] MD [facility], [facility] 06/01/2015 4:44 PM

## 2015-06-01 NOTE — Assessment & Plan Note (Signed)
History of hyperlipidemia on pravastatin followed by his PCP 

## 2015-06-01 NOTE — Patient Instructions (Signed)
Medication Instructions:  Your physician recommends that you continue on your current medications as directed. Please refer to the Current Medication list given to you today.   Labwork: none  Testing/Procedures: none  Follow-Up: We request that you follow-up in: 3 months with Corine ShelterLuke Kilroy, [place] and in 6 months with Dr San MorelleBerry  You will receive a reminder letter in the mail two months in advance. If you don't receive a letter, please call our office to schedule the follow-up appointment.    Any Other Special Instructions Will Be Listed Below (If Applicable).     If you need a refill on your cardiac medications before your next appointment, please call your pharmacy.

## 2015-06-01 NOTE — Assessment & Plan Note (Signed)
History of dyspnea on exertion with known systolic heart failure. His recent BNP performed several weeks ago at the request of Corine ShelterLuke Kilroy revealed a decline for approximately [phone]. He does not appear to be volume overloaded

## 2015-06-01 NOTE — Assessment & Plan Note (Signed)
History of paroxysmal atrial ablation maintaining sinus rhythm on low-dose  Eliquis

## 2015-06-01 NOTE — Assessment & Plan Note (Signed)
History of hypertension blood pressure measured at 128/56. He is on amlodipine, carvedilol. Continue current meds at current dosing

## 2015-06-01 NOTE — Assessment & Plan Note (Signed)
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 He and/or chordae Physician by Dr. Nicholaus BloomKelley 03/02/11 with failed attempt at circumflex recanalization, gated by Rotablator wire tip fracture. He's had a Myoview stress test performed 03/30/11 that showed mild lateral ischemia is spinal by his anatomy. She denies chest pain. His ejection fraction remains in the 35-40% range.

## 2015-06-01 NOTE — Telephone Encounter (Signed)
New message      Pt has an appt on 06-15-15.  Pt is weak and has no energy.  Is there something that can be done prior to the 3-22 appt

## 2015-06-10 ENCOUNTER — Telehealth: Payer: Self-pay | Admitting: Pulmonary Disease

## 2015-06-10 NOTE — Telephone Encounter (Signed)
He states he is experiencing shortness of breath.  Nothing open on schedule.  Please call for an appt.

## 2015-06-10 NOTE — Telephone Encounter (Signed)
Called spoke with pt. He kept his appt for PFT on 06/14/15 and r/s his appt w/ AD on 3/23 at 2pm. He needed nothing further

## 2015-06-14 ENCOUNTER — Ambulatory Visit (INDEPENDENT_AMBULATORY_CARE_PROVIDER_SITE_OTHER): Payer: Medicare Other | Admitting: Pulmonary Disease

## 2015-06-14 ENCOUNTER — Ambulatory Visit: Payer: Medicare Other | Admitting: Pulmonary Disease

## 2015-06-14 DIAGNOSIS — J439 Emphysema, unspecified: Secondary | ICD-10-CM

## 2015-06-14 LAB — PULMONARY FUNCTION TEST
DL/VA % pred: 68 %
DL/VA: 3.14 ml/min/mmHg/L
DLCO COR % PRED: 52 %
DLCO UNC % PRED: 47 %
DLCO cor: 17.01 ml/min/mmHg
DLCO unc: 15.25 ml/min/mmHg
FEF 25-75 PRE: 1.39 L/s
FEF 25-75 Post: 1.57 L/sec
FEF2575-%CHANGE-POST: 13 %
FEF2575-%Pred-Post: 81 %
FEF2575-%Pred-Pre: 71 %
FEV1-%Change-Post: 5 %
FEV1-%PRED-PRE: 72 %
FEV1-%Pred-Post: 76 %
FEV1-POST: 2.18 L
FEV1-PRE: 2.07 L
FEV1FVC-%CHANGE-POST: 4 %
FEV1FVC-%Pred-Pre: 98 %
FEV6-%CHANGE-POST: 1 %
FEV6-%PRED-POST: 80 %
FEV6-%PRED-PRE: 79 %
FEV6-POST: 3.01 L
FEV6-PRE: 2.96 L
FEV6FVC-%CHANGE-POST: 0 %
FEV6FVC-%PRED-POST: 107 %
FEV6FVC-%PRED-PRE: 107 %
FVC-%CHANGE-POST: 1 %
FVC-%PRED-POST: 75 %
FVC-%Pred-Pre: 74 %
FVC-Post: 3.01 L
FVC-Pre: 2.97 L
PRE FEV1/FVC RATIO: 70 %
Post FEV1/FVC ratio: 73 %
Post FEV6/FVC ratio: 100 %
Pre FEV6/FVC Ratio: 100 %
RV % PRED: 101 %
RV: 2.72 L
TLC % PRED: 80 %
TLC: 5.67 L

## 2015-06-14 NOTE — Progress Notes (Signed)
PFT done today. 

## 2015-06-15 ENCOUNTER — Ambulatory Visit: Payer: Medicare Other | Admitting: Cardiology

## 2015-06-16 ENCOUNTER — Encounter: Payer: Self-pay | Admitting: Pulmonary Disease

## 2015-06-16 ENCOUNTER — Ambulatory Visit (INDEPENDENT_AMBULATORY_CARE_PROVIDER_SITE_OTHER): Payer: Medicare Other | Admitting: Pulmonary Disease

## 2015-06-16 ENCOUNTER — Ambulatory Visit (INDEPENDENT_AMBULATORY_CARE_PROVIDER_SITE_OTHER)
Admission: RE | Admit: 2015-06-16 | Discharge: 2015-06-16 | Disposition: A | Discharge status: Home / Self Care | Payer: Medicare Other | Source: Ambulatory Visit | Attending: Pulmonary Disease | Admitting: Pulmonary Disease

## 2015-06-16 ENCOUNTER — Other Ambulatory Visit (INDEPENDENT_AMBULATORY_CARE_PROVIDER_SITE_OTHER): Payer: Medicare Other

## 2015-06-16 VITALS — BP 108/58 | HR 58 | Ht 70.0 in | Wt 181.4 lb

## 2015-06-16 DIAGNOSIS — R0609 Other forms of dyspnea: Secondary | ICD-10-CM

## 2015-06-16 DIAGNOSIS — J438 Other emphysema: Secondary | ICD-10-CM

## 2015-06-16 DIAGNOSIS — R599 Enlarged lymph nodes, unspecified: Secondary | ICD-10-CM

## 2015-06-16 DIAGNOSIS — I255 Ischemic cardiomyopathy: Secondary | ICD-10-CM

## 2015-06-16 DIAGNOSIS — R590 Localized enlarged lymph nodes: Secondary | ICD-10-CM

## 2015-06-16 DIAGNOSIS — R059 Cough, unspecified: Secondary | ICD-10-CM

## 2015-06-16 DIAGNOSIS — R05 Cough: Secondary | ICD-10-CM | POA: Diagnosis not present

## 2015-06-16 DIAGNOSIS — R0902 Hypoxemia: Secondary | ICD-10-CM

## 2015-06-16 LAB — BASIC METABOLIC PANEL
BUN: 61 mg/dL — AB (ref 6–23)
CHLORIDE: 100 meq/L (ref 96–112)
CO2: 23 meq/L (ref 19–32)
Calcium: 9.1 mg/dL (ref 8.4–10.5)
Creatinine, Ser: 2.66 mg/dL — ABNORMAL HIGH (ref 0.40–1.50)
GFR: 24.67 mL/min — ABNORMAL LOW (ref >60.00)
GLUCOSE: 252 mg/dL — AB (ref 70–99)
POTASSIUM: 4.1 meq/L (ref 3.5–5.1)
Sodium: 134 mEq/L — ABNORMAL LOW (ref 135–145)

## 2015-06-16 LAB — CBC WITH DIFFERENTIAL/PLATELET
BASOS PCT: 0.7 % (ref 0.0–3.0)
Basophils Absolute: 0 10*3/uL (ref 0.0–0.1)
EOS PCT: 1.8 % (ref 0.0–5.0)
Eosinophils Absolute: 0.1 10*3/uL (ref 0.0–0.7)
HCT: 24.8 % — ABNORMAL LOW (ref 39.0–52.0)
LYMPHS ABS: 0.5 10*3/uL — AB (ref 0.7–4.0)
Lymphocytes Relative: 11.1 % — ABNORMAL LOW (ref 12.0–46.0)
MCHC: 33.4 g/dL (ref 30.0–36.0)
MCV: 97.8 fl (ref 78.0–100.0)
MONO ABS: 0.4 10*3/uL (ref 0.1–1.0)
MONOS PCT: 7.4 % (ref 3.0–12.0)
NEUTROS PCT: 79 % — AB (ref 43.0–77.0)
Neutro Abs: 3.8 10*3/uL (ref 1.4–7.7)
Platelets: 214 10*3/uL (ref 150.0–400.0)
RBC: 2.53 Mil/uL — ABNORMAL LOW (ref 4.22–5.81)
RDW: 17.3 % — AB (ref 11.5–15.5)
WBC: 4.8 10*3/uL (ref 4.0–10.5)

## 2015-06-16 MED ORDER — ALBUTEROL SULFATE HFA 108 (90 BASE) MCG/ACT IN AERS: 2.0000 | INHALATION_SPRAY | Freq: Four times a day (QID) | RESPIRATORY_TRACT | Status: AC | PRN

## 2015-06-16 NOTE — Assessment & Plan Note (Addendum)
Pt with chronic cough but seems worse since aug 2016. Better the last month.  Has PND and frequent cleariing of throat. Has GERD but stable.  Plan: 1. Flonase not working. He stopped.  2. Cont GERD meds. 3. Observe.

## 2015-06-16 NOTE — Assessment & Plan Note (Addendum)
30PY smoking history, quit in 1965. (-) H/O CA. Daughter has Sarcoidosis and is severe per pt.  Chest ct scan (04/2015) -- 2.3 cm 4R node/ R paratracheal LN, 1.7 cm subcarinal LN, mild mediastinal LN. No contrast as pt with CKD (Creat in 2). PET scan (04/2015) no avid uptake. Could be inflammatory  Differentials: Inflammatory, Infectious, Malignancy. Could be related to previous infxn late last yr. LN could have been bigger and worse but no previous CT scan.   Plan : 1. Chest ct scan no contrast in 07/2015.

## 2015-06-16 NOTE — Assessment & Plan Note (Addendum)
O2 sats 87 on RA.  Inc to 91% on 2L at rest. (3/23) Pt did a walk test and needed 2L with exertion (3/23)

## 2015-06-16 NOTE — Patient Instructions (Signed)
1. Continue Symbicort, 160/4.5, 2 puffs twice a day. Continue albuterol, 2 puffs every 4 hours as needed. 2. We will order you chest x-ray, blood work. 3. We will schedule you chest CT scan, no contrast, in May 2017.  Return to clinic in 3 mos.

## 2015-06-16 NOTE — Assessment & Plan Note (Addendum)
20PY smoking history, quit in 1965. CXR in 2012 showed COPD changes.  Chest CT scan (04/2015)  Mild COPD changes.  Recent SOB. Has cardiac issues with seem stable. Likely with moderate COPD.  PFT (05/2015) mild copd. FEV1  2.07  72%  DLCO 47%.   Plan: 1. Cont symbicort 160/4.5 2P  BID.  meds is expensive. Pt in the [facility] of getting [facility] -- will [facility]Is per [facility] choice.  2. Cont alb HFA prn 3. Start O2 2L 4. Need to observe HR on MDIs. Has afib -- HR in 60s. 5. UTD with flu and pna vaccine.

## 2015-06-16 NOTE — Progress Notes (Signed)
Subjective:    Patient ID: [name], male    DOB: January 28, 1933, 82 y.o.   MRN: [id number]  HPI   Pt is here for f/u on his copd/SOB/abn chest ct scan.   ROV (06/16/15)  Since last seen, he has been having near syncope/syncope. Had a fall -- pt refused to go to ED.  SOB better. Cough is better. Not ben admitted recently.   Review of Systems  Constitutional: Negative.  Negative for fever, fatigue and unexpected weight change.       No changes in weight  HENT: Positive for postnasal drip. Negative for congestion, dental problem, ear pain, nosebleeds, rhinorrhea, sinus pressure, sneezing, sore throat and trouble swallowing.        Cough and congestion better  Eyes: Negative.  Negative for redness and itching.  Respiratory: Positive for cough and shortness of breath. Negative for choking, chest tightness and wheezing.   Cardiovascular: Negative.  Negative for chest pain, palpitations and leg swelling.  Gastrointestinal: Negative.  Negative for nausea and vomiting.  Endocrine: Negative.   Genitourinary: Negative.  Negative for dysuria.  Musculoskeletal: Positive for arthralgias. Negative for joint swelling.  Skin: Negative.  Negative for rash.  Allergic/Immunologic: Negative.   Neurological: Negative.  Negative for headaches.  Hematological: Negative.  Does not bruise/bleed easily.  Psychiatric/Behavioral: Negative.  Negative for dysphoric mood. The patient is not nervous/anxious.   All other systems reviewed and are negative.     Objective:   Physical Exam   Vitals:  Filed Vitals:   06/16/15 1404  BP: 108/58  Pulse: 58  Height: 5\' 10"  (1.778 m)  Weight: 181 lb 6.4 oz (82.283 kg)  SpO2: 88%    Constitutional/General:  Pleasant, well-nourished, well-developed, not in any distress,  Comfortably seating.  Well kempt. Looked pale.   Body mass index is 26.03 kg/(m^2).  HEENT: Pupils equal and reactive to light and accommodation. Anicteric sclerae. Normal nasal mucosa.    No oral  lesions,  mouth clear,  oropharynx clear, no postnasal drip. (-) Oral thrush. No dental caries.  Airway - Mallampati class III-IV  Neck: No masses. Midline trachea. No JVD, (-) LAD. (-) bruits appreciated.  Respiratory/Chest: Grossly normal chest. (-) deformity. (-) Accessory muscle use.  Symmetric expansion. (-) Tenderness on palpation.  Resonant on percussion.  Diminished BS on both lower lung zones. (-) wheezing, rhonchi Bibasilar crackles. (-) egophony  Cardiovascular: Regular rate and  rhythm, heart sounds normal, no murmur or gallops, no peripheral edema  Gastrointestinal:  Normal bowel sounds. Soft, non-tender. No hepatosplenomegaly.  (-) masses.   Musculoskeletal:  Normal muscle tone. Normal gait.   Extremities: Grossly normal. (-) clubbing, cyanosis.  (-) edema  Skin: (-) rash,lesions seen.   Neurological/Psychiatric : alert, oriented to time, place, person. Normal mood and affect       Assessment & Plan:  COPD (chronic obstructive pulmonary disease) with emphysema (HCC) 20PY smoking history, quit in 1965. CXR in 2012 showed COPD changes.  Chest CT scan (04/2015)  Mild COPD changes.  Recent SOB. Has cardiac issues with seem stable. Likely with moderate COPD.  PFT (05/2015) mild copd. FEV1  2.07  72%  DLCO 47%.   Plan: 1. Cont symbicort 160/4.5 2P  BID.  meds is expensive. Pt in the [facility] of getting [facility] -- will switch MDIs per [facility] choice.  2. Cont alb HFA prn 3. Start O2 2L 4. Need to observe HR on MDIs. Has afib -- HR in 60s. 5. UTD with flu and  pna vaccine.     Cough Pt with chronic cough but seems worse since aug 2016. Better the last month.  Has PND and frequent cleariing of throat. Has GERD but stable.  Plan: 1. Flonase not working. He stopped.  2. Cont GERD meds. 3. Observe.      Dyspnea on exertion Pt with CAD, CHF EF 45-50. Follows with cardiology. Sees Dr. Allyson Sabal and Kathi Der.  He does not seem to be in acute  exacerbation of CHF. CXR (3/23) -- no change compared to 02/2015     Lymphadenopathy, mediastinal 30PY smoking history, quit in 1965. (-) H/O CA. Daughter has Sarcoidosis and is severe per pt.  Chest ct scan (04/2015) -- 2.3 cm 4R node/ R paratracheal LN, 1.7 cm subcarinal LN, mild mediastinal LN. No contrast as pt with CKD (Creat in 2). PET scan (04/2015) no avid uptake. Could be inflammatory  Differentials: Inflammatory, Infectious, Malignancy. Could be related to previous infxn late last yr. LN could have been bigger and worse but no previous CT scan.   Plan : 1. Chest ct scan no contrast in 07/2015.     Hypoxemia O2 sats 87 on RA.  Inc to 91% on 2L at rest. (3/23) Pt did a walk test and needed 2L with exertion (3/23)    F/U in 3 mos.   Pollie Meyer, MD 06/17/2015, 12:38 AM [facility] Pager [phone] After 3 pm or if no answer, call [phone]

## 2015-06-16 NOTE — Assessment & Plan Note (Addendum)
Pt with CAD, CHF EF 45-50. Follows with cardiology. Sees Dr. Allyson SabalBerry and Kathi DerJonathan Kilroy.  He does not seem to be in acute exacerbation of CHF. CXR (3/23) -- no change compared to 02/2015

## 2015-06-17 ENCOUNTER — Telehealth: Payer: Self-pay | Admitting: Pulmonary Disease

## 2015-06-17 NOTE — Progress Notes (Signed)
Quick Note:  Called and spoke to The Mutual of OmahaMarshall Dinneen [name]. Reviewed results and recs. He voiced understanding and had no further questions. Results faxed to PCP. Nothing further needed. ______

## 2015-06-17 NOTE — Telephone Encounter (Signed)
Notes Recorded by [name] SjogrenJose Angelo A de Dios, MD on 06/17/2015 at 1:00 AM pls call pt. His creat is a little above his baseline. Its 2.66 from 2.4 1 month ago. Hb Is 8 from 10 1 month ago and Hct of 25 from 30 1 month ago. pls forward results to pcp -- not sure why he is anemic, could be from kidney dse. May need colonoscopy as well. Thanks.  Called and spoke with [name]. Reviewed results and recs. He voiced understanding and had no further questions. Results faxed to PCP. Nothing further needed.

## 2015-06-22 ENCOUNTER — Other Ambulatory Visit: Payer: Self-pay | Admitting: Family Medicine

## 2015-06-22 DIAGNOSIS — R55 Syncope and collapse: Secondary | ICD-10-CM

## 2015-06-23 ENCOUNTER — Encounter (HOSPITAL_COMMUNITY): Payer: Self-pay | Admitting: *Deleted

## 2015-06-27 ENCOUNTER — Ambulatory Visit
Admission: RE | Admit: 2015-06-27 | Discharge: 2015-06-27 | Disposition: A | Discharge status: Home / Self Care | Payer: Medicare Other | Source: Ambulatory Visit | Attending: Family Medicine | Admitting: Family Medicine

## 2015-06-27 DIAGNOSIS — R55 Syncope and collapse: Secondary | ICD-10-CM

## 2015-06-28 ENCOUNTER — Other Ambulatory Visit (HOSPITAL_COMMUNITY): Payer: Self-pay | Admitting: *Deleted

## 2015-06-29 ENCOUNTER — Ambulatory Visit (HOSPITAL_COMMUNITY)
Admission: RE | Admit: 2015-06-29 | Discharge: 2015-06-29 | Disposition: A | Discharge status: Home / Self Care | Payer: Medicare Other | Source: Ambulatory Visit | Attending: Gastroenterology | Admitting: Gastroenterology

## 2015-06-29 DIAGNOSIS — D649 Anemia, unspecified: Secondary | ICD-10-CM | POA: Diagnosis present

## 2015-06-29 MED ORDER — SODIUM CHLORIDE 0.9 % IV SOLN
510.0000 mg | INTRAVENOUS | Status: DC
  Administered 2015-06-29: 510 mg via INTRAVENOUS
  Filled 2015-06-29: qty 17

## 2015-06-30 ENCOUNTER — Ambulatory Visit (HOSPITAL_COMMUNITY)
Admission: RE | Admit: 2015-06-30 | Discharge: 2015-06-30 | Disposition: A | Discharge status: Home / Self Care | Payer: Medicare Other | Source: Ambulatory Visit | Attending: Gastroenterology | Admitting: Gastroenterology

## 2015-06-30 ENCOUNTER — Ambulatory Visit (HOSPITAL_COMMUNITY): Payer: Medicare Other | Admitting: Certified Registered Nurse Anesthetist

## 2015-06-30 ENCOUNTER — Encounter (HOSPITAL_COMMUNITY): Payer: Self-pay | Admitting: *Deleted

## 2015-06-30 ENCOUNTER — Encounter (HOSPITAL_COMMUNITY)
Admission: RE | Disposition: A | Discharge status: Home / Self Care | Payer: Self-pay | Source: Ambulatory Visit | Attending: Gastroenterology

## 2015-06-30 ENCOUNTER — Other Ambulatory Visit: Payer: Self-pay | Admitting: Gastroenterology

## 2015-06-30 DIAGNOSIS — Z9861 Coronary angioplasty status: Secondary | ICD-10-CM | POA: Insufficient documentation

## 2015-06-30 DIAGNOSIS — D509 Iron deficiency anemia, unspecified: Secondary | ICD-10-CM | POA: Insufficient documentation

## 2015-06-30 DIAGNOSIS — I447 Left bundle-branch block, unspecified: Secondary | ICD-10-CM | POA: Diagnosis not present

## 2015-06-30 DIAGNOSIS — I130 Hypertensive heart and chronic kidney disease with heart failure and stage 1 through stage 4 chronic kidney disease, or unspecified chronic kidney disease: Secondary | ICD-10-CM | POA: Diagnosis not present

## 2015-06-30 DIAGNOSIS — Z7901 Long term (current) use of anticoagulants: Secondary | ICD-10-CM | POA: Diagnosis not present

## 2015-06-30 DIAGNOSIS — R195 Other fecal abnormalities: Secondary | ICD-10-CM | POA: Insufficient documentation

## 2015-06-30 DIAGNOSIS — Z87891 Personal history of nicotine dependence: Secondary | ICD-10-CM | POA: Insufficient documentation

## 2015-06-30 DIAGNOSIS — Z79899 Other long term (current) drug therapy: Secondary | ICD-10-CM | POA: Insufficient documentation

## 2015-06-30 DIAGNOSIS — I252 Old myocardial infarction: Secondary | ICD-10-CM | POA: Insufficient documentation

## 2015-06-30 DIAGNOSIS — E1122 Type 2 diabetes mellitus with diabetic chronic kidney disease: Secondary | ICD-10-CM | POA: Diagnosis not present

## 2015-06-30 DIAGNOSIS — Z794 Long term (current) use of insulin: Secondary | ICD-10-CM | POA: Insufficient documentation

## 2015-06-30 DIAGNOSIS — D649 Anemia, unspecified: Secondary | ICD-10-CM | POA: Diagnosis not present

## 2015-06-30 DIAGNOSIS — I509 Heart failure, unspecified: Secondary | ICD-10-CM | POA: Insufficient documentation

## 2015-06-30 DIAGNOSIS — I480 Paroxysmal atrial fibrillation: Secondary | ICD-10-CM | POA: Diagnosis not present

## 2015-06-30 DIAGNOSIS — I739 Peripheral vascular disease, unspecified: Secondary | ICD-10-CM | POA: Insufficient documentation

## 2015-06-30 DIAGNOSIS — K449 Diaphragmatic hernia without obstruction or gangrene: Secondary | ICD-10-CM | POA: Diagnosis not present

## 2015-06-30 DIAGNOSIS — E785 Hyperlipidemia, unspecified: Secondary | ICD-10-CM | POA: Insufficient documentation

## 2015-06-30 DIAGNOSIS — N183 Chronic kidney disease, stage 3 (moderate): Secondary | ICD-10-CM | POA: Diagnosis not present

## 2015-06-30 DIAGNOSIS — Z7982 Long term (current) use of aspirin: Secondary | ICD-10-CM | POA: Insufficient documentation

## 2015-06-30 DIAGNOSIS — Z951 Presence of aortocoronary bypass graft: Secondary | ICD-10-CM | POA: Insufficient documentation

## 2015-06-30 DIAGNOSIS — K219 Gastro-esophageal reflux disease without esophagitis: Secondary | ICD-10-CM | POA: Diagnosis not present

## 2015-06-30 DIAGNOSIS — I255 Ischemic cardiomyopathy: Secondary | ICD-10-CM | POA: Insufficient documentation

## 2015-06-30 DIAGNOSIS — J439 Emphysema, unspecified: Secondary | ICD-10-CM | POA: Diagnosis not present

## 2015-06-30 DIAGNOSIS — I2510 Atherosclerotic heart disease of native coronary artery without angina pectoris: Secondary | ICD-10-CM | POA: Insufficient documentation

## 2015-06-30 HISTORY — PX: ESOPHAGOGASTRODUODENOSCOPY (EGD) WITH PROPOFOL: SHX5813

## 2015-06-30 LAB — GLUCOSE, CAPILLARY: GLUCOSE-CAPILLARY: 91 mg/dL (ref 65–99)

## 2015-06-30 SURGERY — ESOPHAGOGASTRODUODENOSCOPY (EGD) WITH PROPOFOL
Anesthesia: Monitor Anesthesia Care

## 2015-06-30 MED ORDER — LIDOCAINE HCL (CARDIAC) 20 MG/ML IV SOLN
INTRAVENOUS | Status: AC
  Filled 2015-06-30: qty 5

## 2015-06-30 MED ORDER — SODIUM CHLORIDE 0.9 % IV SOLN
INTRAVENOUS | Status: DC
  Administered 2015-06-30: 13:00:00 via INTRAVENOUS

## 2015-06-30 MED ORDER — LIDOCAINE HCL (CARDIAC) 20 MG/ML IV SOLN
INTRAVENOUS | Status: DC | PRN
  Administered 2015-06-30: 60 mg via INTRAVENOUS

## 2015-06-30 MED ORDER — PROPOFOL 500 MG/50ML IV EMUL
INTRAVENOUS | Status: DC | PRN
  Administered 2015-06-30: 50 ug/kg/min via INTRAVENOUS

## 2015-06-30 MED ORDER — ONDANSETRON HCL 4 MG/2ML IJ SOLN
INTRAMUSCULAR | Status: AC
  Filled 2015-06-30: qty 2

## 2015-06-30 MED ORDER — PROPOFOL 10 MG/ML IV BOLUS
INTRAVENOUS | Status: AC
  Filled 2015-06-30: qty 40

## 2015-06-30 MED ORDER — BUTAMBEN-TETRACAINE-BENZOCAINE 2-2-14 % EX AERO
INHALATION_SPRAY | CUTANEOUS | Status: DC | PRN
  Administered 2015-06-30: 2 via TOPICAL

## 2015-06-30 SURGICAL SUPPLY — 15 items

## 2015-06-30 NOTE — Op Note (Signed)
[facility] Patient Name: [name] Procedure Date: 06/30/2015 MRN: [id number] Attending MD: Tresea Mall , MD Date of Birth: Aug 17, 1932 CSN:  Age: 82 Admit Type: Outpatient Procedure:                Upper GI endoscopy Indications:              Iron deficiency anemia, Occult blood in stool Providers:                Llana Aliment. Randa Evens, MD, Waynard Tiajuana Leppanen, RN, Lorenda Ishihara,                            Technician, Kym Groom, CRNA Referring MD:              Medicines:                Propofol total dose 50 mg IV, Cetacaine spray Complications:            No immediate complications. Estimated Blood Loss:     Estimated blood loss: none. Procedure:                Pre-Anesthesia Assessment:                           - Prior to the procedure, a History and Physical                            was performed, and patient medications and                            allergies were reviewed. The patient's tolerance of                            previous anesthesia was also reviewed. The risks                            and benefits of the procedure and the sedation                            options and risks were discussed with the patient.                            All questions were answered, and informed consent                            was obtained. Prior Anticoagulants: The patient has                            taken Eliquis (apixaban), last dose was 1 day prior                            to procedure. ASA Grade Assessment: III - A patient                            with severe systemic disease. After reviewing the  risks and benefits, the patient was deemed in                            satisfactory condition to undergo the procedure.                           After obtaining informed consent, the endoscope was                            passed under direct vision. Throughout the                            procedure, the patient's blood  pressure, pulse, and                            oxygen saturations were monitored continuously. The                            Endoscope was introduced through the mouth, and                            advanced to the second part of duodenum. The upper                            GI endoscopy was accomplished without difficulty. Scope In: Scope Out: Findings:      A small hiatal hernia was present.      The stomach was normal.      The examined duodenum was normal. Impression:               - Small hiatal hernia.                           - Normal stomach.                           - Normal examined duodenum.                           - No specimens collected.                           - Blood in stool without cause found on endoscopic                            exam                           - Iron Deficiency Anemia Moderate Sedation:      MAC by anesthesia Recommendation:           - Patient has a contact number available for                            emergencies. The signs and symptoms of potential  delayed complications were discussed with the                            patient. Return to normal activities tomorrow.                            Written discharge instructions were provided to the                            patient.                           - Resume previous diet.                           - Continue present medications.                           - Resume Eliquis (apixaban) at prior dose today.                           - Return to endoscopist in 1 month. Procedure Code(s):        --- Professional ---                           [phone], Esophagogastroduodenoscopy, flexible,                            transoral; diagnostic, including collection of                            specimen(s) by brushing or washing, when performed                            (separate procedure) Diagnosis Code(s):        --- Professional ---                            D50.9, Iron deficiency anemia, unspecified                           R19.5, Other fecal abnormalities                           K44.9, Diaphragmatic hernia without obstruction or                            gangrene CPT copyright 2016 American Medical Association. All rights reserved. The codes documented in this report are preliminary and upon coder review may  be revised to meet current compliance requirements. Carman ChingJames Latorsha Curling, MD Tresea MallJames L Adarian Bur, MD 06/30/2015 1:51:54 PM This report has been signed electronically. Number of Addenda: 0

## 2015-06-30 NOTE — H&P (Signed)
Subjective:   Patient is a 82 y.o. male presents with Chronic iron deficiency anemia and trace positive stool. 2 years ago he had colonoscopy revealing diverticulosis and hemorrhoids but no masses or polyps. He has CKD and is followed by nephrology and has received IV iron and we went ahead and repeated some IV iron recently after his hemoglobin returned 8.5 with an iron saturation of 10%. He does have a history of PAF and is chronically anticoagulated with Eliquis. This is been on hold for 18 hours prior to endoscopy. He has a sensation of regurgitation in his throat at times and has not had EGD many many years. For that reason EGD is to be performed. His other problems include COPD followed by Dr Christene Slates. Procedure including risks and benefits discussed in office.  Patient Active Problem List   Diagnosis Date Noted  . Hypoxemia 06/16/2015  . Lymphadenopathy, mediastinal 05/07/2015  . COPD (chronic obstructive pulmonary disease) with emphysema (HCC) 05/04/2015  . Pneumonia 05/04/2015  . Abnormal chest x-ray 05/04/2015  . Dyspnea on exertion 05/03/2015  . Anemia of chronic disease 05/03/2015  . Coronary artery disease with angina pectoris (HCC) 03/13/2015  . Chronic anticoagulation 12/14/2014  . PAF (paroxysmal atrial fibrillation) (HCC) 08/27/2014  . Hyperkalemia 01/13/2014  . SBO (small bowel obstruction)- Oct 2014- conservative Rx 12/22/2012  . PVD - RCE '09 12/04/2012  . Chronic renal insufficiency, stage III (moderate)- follwed by Dr Abel Presto 12/04/2012  . Angina at rest [facility]) 02/27/2011  . CAD - CABG '94. PCI attempt 2012, Myoview low risk Jan 2013 02/27/2011  . Cardiomyopathy, ischemic- last EF 35-40% by echo May 2016 02/27/2011  . LBBB (left bundle branch block) 02/27/2011  . Hyperlipemia 02/27/2011  . Essential hypertension 12/06/2008  . Cough 12/06/2008   Past Medical History  Diagnosis Date  . Angina at rest [facility]) 02/27/2011  . CAD (coronary artery disease) 02/27/2011     Catheterized on 02/28/11, graft supplying marginal vessel was occluded, after 48 hours of hydration, a repeat cath was performed in an attempt at PCI 03/02/11 cath had an unsuccessful attempt at PCI to an occluded calcified circumflex vessel, Residual distal tip of the ChoICE PT moderate support wire at the subtotal occlusion. No change in the subtotal occlusion,improved retrograde collateralization  . LV dysfunction 02/27/2011  . CKD (chronic kidney disease) stage 3, GFR 30-59 ml/min 02/27/2011  . LBBB (left bundle branch block) 02/27/2011  . Hypertension   . Hyperlipemia 02/27/2011  . Diabetes mellitus 02/27/2011    insulin dependent  . GERD (gastroesophageal reflux disease)   . CHF (congestive heart failure) (HCC)   . Blood transfusion     no reaction to transfusion per patient  . Arthritis   . Myocardial infarction (HCC)   . PVD (peripheral vascular disease) (HCC)     Carotid Angiography was performed in 2007, no treatment delivered at that time. Right carotid endarterectomy in 2009, being followed by duplex ultrasound. Doppler remains stable.  [name] CAD (coronary artery disease)     PTCA of the right coronary artery in Feb. 1987, July 1987, and June 1990. Circumflex PTCA in July 1993, followed by a RCA, PTCA in Sept. 1994. CABG in 1994x5 with a LIMA to the LAD, vein graft to the first diagonal, vein graft to the marginal and left circumflex coronary artery, and sequential graft to the PDA and PLA branches of the RCA  . HTN (hypertension)     Renal dopplers have been performed on a semi annual basis. Left  renal artery demonstrated vessel narrowing with elevated velocities consistent with a 1-59% diameter reduction.   . carotid doppler     Left ECA : The proximal most aspect of the left external carotid artery demonstrates a peak systolic velocity of 493.9 cm/s. This is consistent with a 70-99% significant stenosis correlated with a 2007 angiogram.  . Paroxysmal atrial fibrillation [facility](HCC)     Past  Surgical History  Procedure Laterality Date  . Coronary artery bypass graft  1994  . Spinal fusion    . Back surgery    . Carotid endarterectomy  2009  . Myoview scan  03/30/2011  . Left heart catheterization with coronary/graft angiogram N/A 02/28/2011    Procedure: LEFT HEART CATHETERIZATION WITH Isabel CapriceORONARY/GRAFT ANGIOGRAM;  Surgeon: Lennette Biharihomas A Kelly, MD;  [facility];  Service: Cardiovascular;  Laterality: N/A;  . Percutaneous coronary stent intervention (pci-s) N/A 03/02/2011    Procedure: PERCUTANEOUS CORONARY STENT INTERVENTION (PCI-S);  Surgeon: Lennette Biharihomas A Kelly, MD;  [facility];  Service: Cardiovascular;  Laterality: N/A;    Prescriptions prior to admission  Medication Sig Dispense Refill Last Dose  . acetaminophen (TYLENOL) 325 MG tablet Take 650 mg by mouth every 6 (six) hours as needed for pain.   Past Month at Unknown time  . albuterol (PROVENTIL HFA;VENTOLIN HFA) 108 (90 Base) MCG/ACT inhaler Inhale 2 puffs into the lungs every 6 (six) hours as needed for wheezing or shortness of breath. 1 Inhaler 6 06/29/2015 at Unknown time  . amLODipine (NORVASC) 10 MG tablet Take 10 mg by mouth every morning.    06/30/2015 at Unknown time  . apixaban (ELIQUIS) 2.5 MG TABS tablet Take 2 tablets (5 mg total) by mouth 2 (two) times daily. (Patient taking differently: Take 2.5 mg by mouth 2 (two) times daily. ) 180 tablet 3 06/29/2015 at 1800  . aspirin 81 MG tablet Take 81 mg by mouth at bedtime.    06/29/2015 at Unknown time  . budesonide-formoterol (SYMBICORT) 160-4.5 MCG/ACT inhaler Inhale 2 puffs into the lungs 2 (two) times daily. 1 Inhaler 6 06/29/2015 at Unknown time  . carvedilol (COREG) 25 MG tablet TAKE 1&1/2 TABLETS BY MOUTH 2 TIMES DAILY WITH MEALS 270 tablet 3 06/30/2015 at Unknown time  . cholecalciferol (VITAMIN D) 1000 UNITS tablet Take 1,000 Units by mouth daily.   06/29/2015 at Unknown time  . diphenhydrAMINE (BENADRYL) 25 MG tablet Take 25 mg by mouth daily.   06/29/2015 at Unknown  time  . ferrous sulfate 325 (65 FE) MG tablet Take 325 mg by mouth daily with breakfast.   06/29/2015 at Unknown time  . FOLIC ACID PO Take 600 mcg by mouth daily.   06/29/2015 at Unknown time  . furosemide (LASIX) 20 MG tablet Take 20 mg by mouth 2 (two) times daily.   06/29/2015 at Unknown time  . glipiZIDE (GLUCOTROL) 10 MG tablet Take 20 mg by mouth 2 (two) times daily.    06/29/2015 at Unknown time  . Glucosamine-Chondroitin (MOVE FREE PO) Take 1 tablet by mouth 2 (two) times daily.    06/29/2015 at Unknown time  . hydrALAZINE (APRESOLINE) 50 MG tablet Take 1 tablet (50 mg total) by mouth 2 (two) times daily. 180 tablet 1 06/30/2015 at Unknown time  . isosorbide mononitrate (IMDUR) 60 MG 24 hr tablet Take 60 mg by mouth daily.   06/30/2015 at 0800  . Multiple Vitamins-Minerals (MULTIVITAMINS THER. W/MINERALS) TABS Take 1 tablet by mouth daily.     06/29/2015 at Unknown time  .  omeprazole (PRILOSEC) 20 MG capsule Take 20 mg by mouth 2 (two) times daily.    06/29/2015 at Unknown time  . pravastatin (PRAVACHOL) 40 MG tablet Take 40 mg by mouth at bedtime.     06/29/2015 at Unknown time  . nitroGLYCERIN (NITROSTAT) 0.4 MG SL tablet Place 0.4 mg under the tongue every 5 (five) minutes as needed for chest pain.   More than a month at Unknown time   Allergies  Allergen Reactions  . Lisinopril Cough    Social History  Substance Use Topics  . Smoking status: Former Smoker -- 22 years    Quit date: 03/26/1958  . Smokeless tobacco: Never Used     Comment: quit smoking in the 1960's  . Alcohol Use: No    Family History  Problem Relation Age of Onset  . Cancer Mother     unknown to pt  . Cancer Father     pancreas  . Cancer Paternal Grandfather     prostate     Objective:   Patient Vitals for the past 8 hrs:  BP Temp Pulse Resp SpO2 Height Weight  06/30/15 1250 (!) 130/50 mmHg 97.8 F (36.6 C) (!) 56 16 97 %  (1.778 m) 83.915 kg (185 lb)         See MD Preop evaluation      Assessment:    1. Iron deficiency anemia hemoccult positive stool  Plan:   Will proceed with EGD with propofol sedation and will check CBC had today.

## 2015-06-30 NOTE — Anesthesia Postprocedure Evaluation (Signed)
Anesthesia Post Note  Patient: [name]  Procedure(s) Performed: Procedure(s) (LRB): ESOPHAGOGASTRODUODENOSCOPY (EGD) WITH PROPOFOL (N/A)  Patient location during evaluation: PACU Anesthesia Type: MAC Level of consciousness: awake and alert Pain management: pain level controlled Vital Signs Assessment: post-procedure vital signs reviewed and stable Respiratory status: spontaneous breathing, nonlabored ventilation, respiratory function stable and patient connected to nasal cannula oxygen Cardiovascular status: stable and blood pressure returned to baseline Anesthetic complications: no    Last Vitals:  Filed Vitals:   06/30/15 1410 06/30/15 1415  BP: 118/54   Pulse: 53 54  Temp:    Resp: 20 15    Last Pain: There were no vitals filed for this visit.               Korene Dula S

## 2015-06-30 NOTE — Anesthesia Preprocedure Evaluation (Addendum)
Anesthesia Evaluation  Patient identified by MRN, date of birth, ID band Patient awake    Reviewed: Allergy & Precautions, NPO status , Patient's Chart, lab work & pertinent test results  Airway Mallampati: II   Neck ROM: full    Dental   Pulmonary shortness of breath and with exertion, COPD, former smoker,    breath sounds clear to auscultation       Cardiovascular hypertension, Pt. on medications and Pt. on home beta blockers + CAD, + Past MI, + CABG, + Peripheral Vascular Disease and +CHF  + dysrhythmias Atrial Fibrillation  Rhythm:regular Rate:Normal  04/2015 Echo: Left ventricle: The cavity size was mildly dilated. Systolic function was moderately to severely reduced. The estimated ejection fraction was in the range of 30% to 35%. There is hypokinesis of the anterolateral myocardium. Doppler parameters  are consistent with a reversible restrictive pattern, indicative of decreased left ventricular diastolic compliance and/or increased left atrial pressure (grade 3 diastolic dysfunction). - Aortic valve: Trileaflet; mildly thickened, moderately calcified leaflets. Valve mobility was restricted. - Right ventricle: The cavity size was moderately dilated. Wall thickness was normal. - Right atrium: The atrium was moderately dilated.   Neuro/Psych negative neurological ROS     GI/Hepatic Neg liver ROS, GERD  ,  Endo/Other  diabetes  Renal/GU Renal Insufficiency and CRFRenal disease     Musculoskeletal  (+) Arthritis ,   Abdominal   Peds  Hematology  (+) anemia ,   Anesthesia Other Findings   Reproductive/Obstetrics                            Lab Results  Component Value Date   WBC 4.8 06/16/2015   HGB 8.3 Repeated and verified X2.* 06/16/2015   HCT 24.8 Repeated and verified X2.* 06/16/2015   MCV 97.8 06/16/2015   PLT 214.0 06/16/2015   Lab Results  Component Value Date   CREATININE  2.66* 06/16/2015   BUN 61* 06/16/2015   NA 134* 06/16/2015   K 4.1 06/16/2015   CL 100 06/16/2015   CO2 23 06/16/2015    Anesthesia Physical Anesthesia Plan  ASA: III  Anesthesia Plan: MAC   Post-op Pain Management:    Induction: Intravenous  Airway Management Planned: Natural Airway and Nasal Cannula  Additional Equipment:   Intra-op Plan:   Post-operative Plan:   Informed Consent: I have reviewed the patients History and Physical, chart, labs and discussed the procedure including the risks, benefits and alternatives for the proposed anesthesia with the patient or authorized representative who has indicated his/her understanding and acceptance.     Plan Discussed with: CRNA  Anesthesia Plan Comments:         Anesthesia Quick Evaluation

## 2015-06-30 NOTE — Addendum Note (Signed)
Addendum  created 06/30/15 1435 by Wynonia SoursKaren L Madysyn Hanken, CRNA   Modules edited: Charges VN

## 2015-06-30 NOTE — Anesthesia Procedure Notes (Signed)
Procedure Name: MAC Date/Time: 06/30/2015 1:23 PM Performed by: Wynonia SoursWALKER, Shekira Drummer L Pre-anesthesia Checklist: Patient identified, Timeout performed, Emergency Drugs available, Suction available and Patient being monitored Oxygen Delivery Method: Nasal cannula

## 2015-06-30 NOTE — Transfer of Care (Signed)
Immediate Anesthesia Transfer of Care Note  Patient: [name]  Procedure(s) Performed: Procedure(s): ESOPHAGOGASTRODUODENOSCOPY (EGD) WITH PROPOFOL (N/A)  Patient Location: PACU  Anesthesia Type:MAC  Level of Consciousness:  sedated, patient cooperative and responds to stimulation  Airway & Oxygen Therapy:Patient Spontanous Breathing and Patient connected to face mask oxgen  Post-op Assessment:  Report given to PACU RN and Post -op Vital signs reviewed and stable  Post vital signs:  Reviewed and stable  Last Vitals:  Filed Vitals:   06/30/15 1348 06/30/15 1350  BP: 128/55   Pulse: 52 51  Temp: 36.4 C   Resp: 14 17    Complications: No apparent anesthesia complications

## 2015-06-30 NOTE — Discharge Instructions (Signed)
Resume eliquis today

## 2015-07-01 ENCOUNTER — Encounter (HOSPITAL_COMMUNITY): Payer: Self-pay | Admitting: Gastroenterology

## 2015-07-06 ENCOUNTER — Ambulatory Visit (HOSPITAL_COMMUNITY)
Admission: RE | Admit: 2015-07-06 | Discharge: 2015-07-06 | Disposition: A | Discharge status: Home / Self Care | Payer: Medicare Other | Source: Ambulatory Visit | Attending: Gastroenterology | Admitting: Gastroenterology

## 2015-07-06 DIAGNOSIS — D649 Anemia, unspecified: Secondary | ICD-10-CM | POA: Diagnosis not present

## 2015-07-06 MED ORDER — SODIUM CHLORIDE 0.9 % IV SOLN
510.0000 mg | INTRAVENOUS | Status: DC
  Filled 2015-07-06: qty 17

## 2015-07-06 MED ORDER — SODIUM CHLORIDE 0.9 % IV SOLN
510.0000 mg | INTRAVENOUS | Status: AC
  Administered 2015-07-06: 510 mg via INTRAVENOUS
  Filled 2015-07-06: qty 17

## 2015-07-15 ENCOUNTER — Other Ambulatory Visit (HOSPITAL_COMMUNITY): Payer: Self-pay | Admitting: *Deleted

## 2015-07-18 ENCOUNTER — Ambulatory Visit (HOSPITAL_COMMUNITY)
Admission: RE | Admit: 2015-07-18 | Discharge: 2015-07-18 | Disposition: A | Discharge status: Home / Self Care | Payer: Medicare Other | Source: Ambulatory Visit | Attending: Gastroenterology | Admitting: Gastroenterology

## 2015-07-18 ENCOUNTER — Ambulatory Visit (HOSPITAL_COMMUNITY): Payer: Medicare Other

## 2015-07-18 DIAGNOSIS — N183 Chronic kidney disease, stage 3 (moderate): Secondary | ICD-10-CM | POA: Diagnosis not present

## 2015-07-18 DIAGNOSIS — Z79899 Other long term (current) drug therapy: Secondary | ICD-10-CM | POA: Diagnosis not present

## 2015-07-18 DIAGNOSIS — D649 Anemia, unspecified: Secondary | ICD-10-CM | POA: Diagnosis not present

## 2015-07-18 DIAGNOSIS — Z5181 Encounter for therapeutic drug level monitoring: Secondary | ICD-10-CM | POA: Diagnosis not present

## 2015-07-18 DIAGNOSIS — D631 Anemia in chronic kidney disease: Secondary | ICD-10-CM | POA: Insufficient documentation

## 2015-07-18 MED ORDER — DARBEPOETIN ALFA 60 MCG/0.3ML IJ SOSY
60.0000 ug | PREFILLED_SYRINGE | INTRAMUSCULAR | Status: DC
  Administered 2015-07-18: 60 ug via SUBCUTANEOUS

## 2015-07-18 MED ORDER — DARBEPOETIN ALFA 60 MCG/0.3ML IJ SOSY
PREFILLED_SYRINGE | INTRAMUSCULAR | Status: AC
  Filled 2015-07-18: qty 0.3

## 2015-07-18 MED ORDER — SODIUM CHLORIDE 0.9 % IV SOLN
510.0000 mg | INTRAVENOUS | Status: DC
  Administered 2015-07-18: 510 mg via INTRAVENOUS
  Filled 2015-07-18: qty 17

## 2015-07-19 LAB — POCT HEMOGLOBIN-HEMACUE: Hemoglobin: 8.7 g/dL — ABNORMAL LOW (ref 13.0–17.0)

## 2015-07-22 ENCOUNTER — Other Ambulatory Visit (HOSPITAL_COMMUNITY): Payer: Self-pay | Admitting: *Deleted

## 2015-07-25 ENCOUNTER — Ambulatory Visit (HOSPITAL_COMMUNITY)
Admission: RE | Admit: 2015-07-25 | Discharge: 2015-07-25 | Disposition: A | Discharge status: Home / Self Care | Payer: Medicare Other | Source: Ambulatory Visit | Attending: Gastroenterology | Admitting: Gastroenterology

## 2015-07-25 DIAGNOSIS — D638 Anemia in other chronic diseases classified elsewhere: Secondary | ICD-10-CM | POA: Insufficient documentation

## 2015-07-25 DIAGNOSIS — N183 Chronic kidney disease, stage 3 (moderate): Secondary | ICD-10-CM | POA: Diagnosis not present

## 2015-07-25 MED ORDER — SODIUM CHLORIDE 0.9 % IV SOLN
510.0000 mg | INTRAVENOUS | Status: DC
  Administered 2015-07-25: 510 mg via INTRAVENOUS
  Filled 2015-07-25: qty 17

## 2015-07-29 ENCOUNTER — Other Ambulatory Visit (HOSPITAL_COMMUNITY): Payer: Self-pay | Admitting: *Deleted

## 2015-08-01 ENCOUNTER — Telehealth: Payer: Self-pay | Admitting: Hematology and Oncology

## 2015-08-01 ENCOUNTER — Ambulatory Visit (HOSPITAL_COMMUNITY)
Admission: RE | Admit: 2015-08-01 | Discharge: 2015-08-01 | Disposition: A | Discharge status: Home / Self Care | Payer: Medicare Other | Source: Ambulatory Visit | Attending: Gastroenterology | Admitting: Gastroenterology

## 2015-08-01 DIAGNOSIS — D638 Anemia in other chronic diseases classified elsewhere: Secondary | ICD-10-CM | POA: Insufficient documentation

## 2015-08-01 DIAGNOSIS — N183 Chronic kidney disease, stage 3 (moderate): Secondary | ICD-10-CM | POA: Insufficient documentation

## 2015-08-01 MED ORDER — FERUMOXYTOL INJECTION 510 MG/17 ML
510.0000 mg | Freq: Once | INTRAVENOUS | Status: AC
  Administered 2015-08-01: 510 mg via INTRAVENOUS
  Filled 2015-08-01: qty 17

## 2015-08-01 NOTE — Telephone Encounter (Signed)
[facility], contacted referring to see if office still wants hem appt. Office will call back

## 2015-08-02 ENCOUNTER — Ambulatory Visit: Payer: Medicare Other | Admitting: Cardiovascular Disease

## 2015-08-03 ENCOUNTER — Telehealth: Payer: Self-pay | Admitting: Hematology and Oncology

## 2015-08-03 NOTE — Telephone Encounter (Signed)
Verified address and insurance, contacted referring provider with appt date/time, contacted pt regarding appt. Completed intake

## 2015-08-05 ENCOUNTER — Encounter: Payer: Self-pay | Admitting: Hematology and Oncology

## 2015-08-05 ENCOUNTER — Telehealth: Payer: Self-pay | Admitting: *Deleted

## 2015-08-05 ENCOUNTER — Ambulatory Visit (INDEPENDENT_AMBULATORY_CARE_PROVIDER_SITE_OTHER): Payer: Medicare Other | Admitting: Cardiovascular Disease

## 2015-08-05 ENCOUNTER — Ambulatory Visit (HOSPITAL_BASED_OUTPATIENT_CLINIC_OR_DEPARTMENT_OTHER): Payer: Medicare Other

## 2015-08-05 ENCOUNTER — Encounter: Payer: Self-pay | Admitting: Cardiovascular Disease

## 2015-08-05 ENCOUNTER — Ambulatory Visit (HOSPITAL_BASED_OUTPATIENT_CLINIC_OR_DEPARTMENT_OTHER): Payer: Medicare Other | Admitting: Hematology and Oncology

## 2015-08-05 ENCOUNTER — Telehealth: Payer: Self-pay | Admitting: Hematology and Oncology

## 2015-08-05 VITALS — BP 95/50 | HR 84 | Ht 71.0 in | Wt 190.1 lb

## 2015-08-05 VITALS — BP 91/56 | HR 86 | Temp 97.5°F | Resp 17 | Ht 71.5 in | Wt 188.6 lb

## 2015-08-05 DIAGNOSIS — N183 Chronic kidney disease, stage 3 unspecified: Secondary | ICD-10-CM

## 2015-08-05 DIAGNOSIS — J438 Other emphysema: Secondary | ICD-10-CM

## 2015-08-05 DIAGNOSIS — I429 Cardiomyopathy, unspecified: Secondary | ICD-10-CM

## 2015-08-05 DIAGNOSIS — N184 Chronic kidney disease, stage 4 (severe): Secondary | ICD-10-CM | POA: Diagnosis present

## 2015-08-05 DIAGNOSIS — I480 Paroxysmal atrial fibrillation: Secondary | ICD-10-CM | POA: Diagnosis not present

## 2015-08-05 DIAGNOSIS — N2889 Other specified disorders of kidney and ureter: Secondary | ICD-10-CM

## 2015-08-05 DIAGNOSIS — I509 Heart failure, unspecified: Secondary | ICD-10-CM | POA: Diagnosis not present

## 2015-08-05 DIAGNOSIS — I255 Ischemic cardiomyopathy: Secondary | ICD-10-CM

## 2015-08-05 DIAGNOSIS — D539 Nutritional anemia, unspecified: Secondary | ICD-10-CM

## 2015-08-05 DIAGNOSIS — D631 Anemia in chronic kidney disease: Secondary | ICD-10-CM

## 2015-08-05 DIAGNOSIS — J449 Chronic obstructive pulmonary disease, unspecified: Secondary | ICD-10-CM

## 2015-08-05 DIAGNOSIS — D638 Anemia in other chronic diseases classified elsewhere: Secondary | ICD-10-CM

## 2015-08-05 HISTORY — DX: Nutritional anemia, unspecified: D53.9

## 2015-08-05 LAB — BASIC METABOLIC PANEL
Anion Gap: 13 mEq/L — ABNORMAL HIGH (ref 3–11)
BUN: 68.5 mg/dL — ABNORMAL HIGH (ref 7.0–26.0)
CO2: 20 mEq/L — ABNORMAL LOW (ref 22–29)
Calcium: 8.8 mg/dL (ref 8.4–10.4)
Chloride: 107 mEq/L (ref 98–109)
Creatinine: 2.8 mg/dL — ABNORMAL HIGH (ref 0.7–1.3)
EGFR: 20 mL/min/{1.73_m2} — ABNORMAL LOW (ref >90)
Glucose: 119 mg/dl (ref 70–140)
Potassium: 3.4 mEq/L — ABNORMAL LOW (ref 3.5–5.1)
Sodium: 140 mEq/L (ref 136–145)

## 2015-08-05 LAB — CBC WITH DIFFERENTIAL/PLATELET
BASO%: 0.4 % (ref 0.0–2.0)
Basophils Absolute: 0 10*3/uL (ref 0.0–0.1)
EOS%: 2.3 % (ref 0.0–7.0)
Eosinophils Absolute: 0.1 10*3/uL (ref 0.0–0.5)
HCT: 27 % — ABNORMAL LOW (ref 38.4–49.9)
HGB: 8.7 g/dL — ABNORMAL LOW (ref 13.0–17.1)
LYMPH%: 10 % — ABNORMAL LOW (ref 14.0–49.0)
MCH: 31.5 pg (ref 27.2–33.4)
MCHC: 32.2 g/dL (ref 32.0–36.0)
MCV: 97.8 fL (ref 79.3–98.0)
MONO#: 0.4 10*3/uL (ref 0.1–0.9)
MONO%: 8.4 % (ref 0.0–14.0)
NEUT#: 3.8 10*3/uL (ref 1.5–6.5)
NEUT%: 78.9 % — ABNORMAL HIGH (ref 39.0–75.0)
Platelets: 146 10*3/uL (ref 140–400)
RBC: 2.76 10*6/uL — ABNORMAL LOW (ref 4.20–5.82)
RDW: 18.1 % — ABNORMAL HIGH (ref 11.0–14.6)
WBC: 4.8 10*3/uL (ref 4.0–10.3)
lymph#: 0.5 10*3/uL — ABNORMAL LOW (ref 0.9–3.3)
nRBC: 0 % (ref 0–0)

## 2015-08-05 LAB — FERRITIN: Ferritin: 709 ng/ml — ABNORMAL HIGH (ref 22–316)

## 2015-08-05 MED ORDER — AMLODIPINE BESYLATE 5 MG PO TABS: 5.0000 mg | ORAL_TABLET | Freq: Every day | ORAL | Status: DC

## 2015-08-05 MED ORDER — FUROSEMIDE 40 MG PO TABS: 80.0000 mg | ORAL_TABLET | Freq: Two times a day (BID) | ORAL | Status: DC

## 2015-08-05 NOTE — Patient Instructions (Signed)
Medication Instructions:  Your physician has recommended you make the following change in your medication:  1- DECREASE amlodipine to 5 mg (1 tablet) by mouth daily. 2- INCREASE furosemide TO 80 mg (2 tablets) by mouth twice a day, if you swelling continues.   Labwork: Your physician recommends that you return for lab work in: 3 weeks. The lab can be found on the FIRST FLOOR of out building in [address]   Testing/Procedures: none  Follow-Up: Your physician recommends that you schedule a follow-up appointment in: 6 WEEKS WITH DR BERRY.  You have been referred to HOME HOSPICE.  You have been referred to HEART FAILURE CLINIC.   If you need a refill on your cardiac medications before your next appointment, please call your pharmacy.

## 2015-08-05 NOTE — Telephone Encounter (Signed)
per pof to sch pt appt-gave pt copyof avs-[facility] sch blood-sent back to lab

## 2015-08-05 NOTE — Assessment & Plan Note (Signed)
History of hyperlipidemia on statin therapy followed by his PCP 

## 2015-08-05 NOTE — Assessment & Plan Note (Signed)
The most likely cause of his current anemia is related to anemia of chronic renal failure. He had received multiple doses of IV iron. He also has received multiple doses of ESA but not on a consistent basis. We discussed some of the risks, benefits, and alternatives of erythropoietin stimulating agents such as Procrit or Aranesp. The patient is symptomatic from anemia and the EPO level is low. Some of the side-effects to be expected including risks of allergic reactions, skin rashes, headaches, risk of blood clots including heart attack and stroke. There is rare risks of causing growth of cancers.The patient is willing to proceed and went ahead to sign consent today. We also discussed possibility of blood transfusion whenever hemoglobin is less than 8 g I will try to give him ESA with plan to get his hemoglobin greater than 11

## 2015-08-05 NOTE — Assessment & Plan Note (Signed)
He is on chronic oxygen therapy under the direction of pulmonologist. Continued same

## 2015-08-05 NOTE — Assessment & Plan Note (Signed)
History of PAF now probably persistent He is in A. Fib today with a left bundle branch block and ventricular response of 84. He is on renally adjusted Eliquis oral anticoagulation.

## 2015-08-05 NOTE — Assessment & Plan Note (Signed)
The patient is on chronic anticoagulation therapy to prevent stroke. His recent renal function has declined and [facility] declining refill for Eliquis. I will defer chronic anticoagulation management and choice of anticoagulation therapy to his cardiologist.

## 2015-08-05 NOTE — Assessment & Plan Note (Signed)
He has worsening renal failure secondary to aggressive diuretic therapy for congestive heart failure. I would defer chronic management to his nephrologist.

## 2015-08-05 NOTE — Assessment & Plan Note (Addendum)
He has multifactorial anemia likely due to anemia of chronic disease, anemia secondary to chronic renal failure and possible low GI bleed due to chronic anticoagulation therapy. I will order additional workup and replace other deficiencies as needed

## 2015-08-05 NOTE — Assessment & Plan Note (Signed)
The patient has chronic congestive heart failure with significant leg edema. He is currently taking higher dose of Lasix due to leg swelling. I would defer management to his cardiologist

## 2015-08-05 NOTE — Progress Notes (Signed)
08/05/2015 [name]   Jan 06, 1933  [id number]  Primary Physician Johny BlamerHARRIS, WILLIAM, MD Primary Cardiologist: Runell GessJonathan J. Barbara Keng MD Roseanne RenoFACP,FACC,FAHA, FSCAI   HPI:  The patient is a very pleasant 82 year old, mildly overweight, married Caucasian male, father of 2, grandfather to 1 grandchild who I last saw /8/17... He has a history of CAD status post coronary artery bypass grafting in 1994. He also has hypertension, hyperlipidemia, insulin-dependent diabetes and peripheral vascular disease. He had right carotid endarterectomy back in 2009 and has been followed by duplex ultrasound here in our office, most recently July of last year. He has mild renal insufficiency. His creatinines run in the 2 range. He was cath'd by Dr. Tresa EndoKelly March 02, 2011, with a failed attempt at native circumflex recanalization complicated by fractured wire tip. His LIMA to his LAD was patent, as was the vein to a diagonal branch and a vein to the PDA and PLA sequentially. The vein to the marginal branch was occluded. His EF was 35% to 40% with anteroapical hypokinesia. He is totally asymptomatic. His Myoview performed March 30, 2011, showed mild lateral ischemia explainable by his anatomy. He does complain of some abdominal pain which apparently may be related to a hernia which she is seeing a Development worker, international aidgeneral surgeon for. Recent lab work performed in December revealed total cholesterol 127, LDL of 61 and HDL 38. He saw Corine ShelterLuke Kilroy PAC back in the office 12/09/12. His medicines were adjusted. He had a small bowel obstruction in October was treated with decompression. Apparently at that time his ejection fraction was increased by 2-D echo.   Because of episodes of PAF down on monitoring he was begun on Eliquis oral anti-coagulation at reduced dose for renal insufficiency. He is on chronic Lasix because of class 2-3 heart failure with an EF of 35-40%. He runs creatinines in the low 2 range and is followed by Dr. Bayard Beaveroiladonato. He was  recently seen in the emergency room by Dr. Anne FuSkains 2 days ago but responsive angina. His enzymes were negative.  He saw Corine ShelterLuke Kilroy in the office 05/03/15. He has been complaining of dyspnea. This has been somewhat responsive the Symbicort. Since I saw him approximately 6 weeks agohe seems to look worse. His renal function has progressively declined. He is anemic and apparently seeing a hematologist he wants to start every 2 week Aranesp injections. I had a long discussion with the patientand his family. I'll confirm that he is a DO NOT RESUSCITATE and F but that in the chart medical record. I suggested that they pursue home hospice which I will assist with. His serum creatinine is increased from 2.6-2.8 over the last several weeks. I am going to increase his Lasix from 40 mg by mouth twice a day to 80 mg by mouth twice a day and will refer him to a heart failure clinic for further suggestions although I suspect at this point there are limited options.    Current Outpatient Prescriptions  Medication Sig Dispense Refill  . acetaminophen (TYLENOL) 325 MG tablet Take 650 mg by mouth every 6 (six) hours as needed for pain.    [name]. albuterol (PROVENTIL HFA;VENTOLIN HFA) 108 (90 Base) MCG/ACT inhaler Inhale 2 puffs into the lungs every 6 (six) hours as needed for wheezing or shortness of breath. 1 Inhaler 6  . amLODipine (NORVASC) 10 MG tablet Take 10 mg by mouth every morning.     [name]. apixaban (ELIQUIS) 2.5 MG TABS tablet Take 2 tablets (5 mg total) by  mouth 2 (two) times daily. (Patient taking differently: Take 2.5 mg by mouth 2 (two) times daily. ) 180 tablet 3  . aspirin 81 MG tablet Take 81 mg by mouth at bedtime.     . budesonide-formoterol (SYMBICORT) 160-4.5 MCG/ACT inhaler Inhale 2 puffs into the lungs 2 (two) times daily. 1 Inhaler 6  . carvedilol (COREG) 25 MG tablet TAKE 1&1/2 TABLETS BY MOUTH 2 TIMES DAILY WITH MEALS 270 tablet 3  . cholecalciferol (VITAMIN D) 1000 UNITS tablet Take 1,000 Units by mouth  daily.    . diphenhydrAMINE (BENADRYL) 25 MG tablet Take 25 mg by mouth daily.    . ferrous sulfate 325 (65 FE) MG tablet Take 325 mg by mouth daily with breakfast.    . FOLIC ACID PO Take 600 mcg by mouth daily.    . furosemide (LASIX) 40 MG tablet Take 40 mg by mouth 2 (two) times daily.    [name] glipiZIDE (GLUCOTROL) 10 MG tablet Take 20 mg by mouth 2 (two) times daily.     . Glucosamine-Chondroitin (MOVE FREE PO) Take 1 tablet by mouth 2 (two) times daily.     . hydrALAZINE (APRESOLINE) 50 MG tablet Take 1 tablet (50 mg total) by mouth 2 (two) times daily. 180 tablet 1  . isosorbide mononitrate (IMDUR) 60 MG 24 hr tablet Take 60 mg by mouth daily.    . Multiple Vitamins-Minerals (MULTIVITAMINS THER. W/MINERALS) TABS Take 1 tablet by mouth daily.      . nitroGLYCERIN (NITROSTAT) 0.4 MG SL tablet Place 0.4 mg under the tongue every 5 (five) minutes as needed for chest pain.    [name] omeprazole (PRILOSEC) 20 MG capsule Take 20 mg by mouth 2 (two) times daily.     . pravastatin (PRAVACHOL) 40 MG tablet Take 40 mg by mouth at bedtime.       No current facility-administered medications for this visit.    Allergies  Allergen Reactions  . Lisinopril Cough    Social History   Social History  . Marital Status: Married    Spouse Name: N/A  . Number of Children: N/A  . Years of Education: N/A   Occupational History  . Not on file.   Social History Main Topics  . Smoking status: Former Smoker -- 22 years    Quit date: 03/26/1958  . Smokeless tobacco: Never Used     Comment: quit smoking in the 1960's  . Alcohol Use: No  . Drug Use: No  . Sexual Activity: Yes   Other Topics Concern  . Not on file   Social History Narrative     Review of Systems: General: negative for chills, fever, night sweats or weight changes.  Cardiovascular: negative for chest pain, dyspnea on exertion, edema, orthopnea, palpitations, paroxysmal nocturnal dyspnea or shortness of breath Dermatological: negative  for rash Respiratory: negative for cough or wheezing Urologic: negative for hematuria Abdominal: negative for nausea, vomiting, diarrhea, bright red blood per rectum, melena, or hematemesis Neurologic: negative for visual changes, syncope, or dizziness All other systems reviewed and are otherwise negative except as noted above.    Blood pressure 95/50, pulse 84, height 5\' 11"  (1.803 m), weight 190 lb 2 oz (86.24 kg).  General appearance: alert and no distress Neck: no adenopathy, no carotid bruit, no JVD, supple, symmetrical, trachea midline and thyroid not enlarged, symmetric, no tenderness/mass/nodules Lungs: clear to auscultation bilaterally Heart: irregularly irregular rhythm Extremities: 2-3+ pitting edema bilaterally  EKG atrial fibrillation with a ventricular response of 84 and  left bundle branch block. I personally reviewed this EKG  ASSESSMENT AND PLAN:   Essential hypertension History of hypertension blood pressure measured at 95/50. He is on amlodipine, carvedilol and hydralazine. Continue current meds at current dosing except and I'm going to decrease his amlodipine from10-5 mg a day.  CAD - CABG '94. PCI attempt 2012, Myoview low risk Jan 2013 History of CAD and ischemic cardiopathy.He had bypass grafting in 1994. His last catheterization performed by Dr. Tresa Endo 03/02/11 was coupled here by a fractured wire tip.The LIMA to his LAD was patent as was the vein to diagonal branch, PDA and PLA. The vein to the marginal branch was occluded. His EF has been in the 30-35% range. He has class 3-4 symptoms He's had progressive shortness of breath and oxygen dependent, progressive lower extremity edema on higher doses of by mouth diuretics with worsening renal function and anemia. I think he has cardiorenal syndrome. I think his prognosis is poor. It was discussed advanced directives and I suggested home hospice. I'm going to increase his diuretics to 80 mg by mouth twice a day and refer him  to the heart failure clinic to see if there are any other potential interventions although I suspect that there are not.  Hyperlipemia History of hyperlipidemia on statin therapy followed by his PCP  PAF (paroxysmal atrial fibrillation) (HCC) History of PAF now probably persistent He is in A. Fib today with a left bundle branch block and ventricular response of 84. He is on renally adjusted Eliquis oral anticoagulation.      [name] MD FACP,FACC,FAHA, [facility] 08/05/2015 4:38 PM

## 2015-08-05 NOTE — Progress Notes (Signed)
[facility] CONSULT NOTE  Patient Care Team: [name] Blamer, MD as PCP - General (Family Medicine) Terrial Rhodes, MD as Consulting Physician (Nephrology) [name], MD as Consulting Physician (Cardiology) Carman Ching, MD as Consulting Physician (Gastroenterology) Louann Sjogren, MD as Consulting Physician (Pulmonary Disease) Artis Delay, MD as Consulting Physician (Hematology and Oncology)  CHIEF COMPLAINTS/PURPOSE OF CONSULTATION:  Severe anemia  HISTORY OF PRESENTING ILLNESS:  [name] 82 y.o. male is here because of severe anemia. He is here accompanied by his son, [name] and his wife [name]. The patient has significant medical comorbidities including congestive heart failure, chronic atrial fibrillation on chronic anticoagulation therapy, stage IV chronic renal failure and emphysema on oxygen therapy. He was referred here because of progressive anemia. The patient has chronic anemia since 2012. Over the past year, he had multiple problems including fall/syncopal episode and chest pain. The patient had been placed on chronic anticoagulation therapy with Eliquis. According to outside records, there were reported intermittent passage of dark stool. However on questioning the patient and family members, the patient denies any recent signs or symptoms of bleeding such as spontaneous epistaxis, hematuria or hematochezia. He had undergone extensive evaluation including repeat EGD in April 2017 which show no evidence of frank GI bleed. Since the beginning of this year, he had received 7 doses of intravenous iron infusion. According to the patient, he was also started on low-dose darbepoetin on 07/21/2015 In the meantime, he follows with cardiologist close to a with medical management for leg swelling/congestive heart failure. He was also noted to have progressive renal failure  He denies recent chest pain on exertion but has chronic shortness of breath  on minimal exertion. He had not noticed any recent bleeding such as epistaxis, hematuria or hematochezia The patient denies over the counter NSAID ingestion. He is on chronic anticoagulation therapy. His last colonoscopy was a few years ago He had no prior history or diagnosis of cancer. His age appropriate screening programs are up-to-date. He denies any pica and eats a variety of diet. He never donated blood. He had received blood transfusion He complained of bilateral lower extremity edema. He also complained of abdominal distention.  MEDICAL HISTORY:  Past Medical History  Diagnosis Date  . Angina at rest [facility]) 02/27/2011  . CAD (coronary artery disease) 02/27/2011    Catheterized on 02/28/11, graft supplying marginal vessel was occluded, after 48 hours of hydration, a repeat cath was performed in an attempt at PCI 03/02/11 cath had an unsuccessful attempt at PCI to an occluded calcified circumflex vessel, Residual distal tip of the [employer] PT moderate support wire at the subtotal occlusion. No change in the subtotal occlusion,improved retrograde collateralization  . LV dysfunction 02/27/2011  . CKD (chronic kidney disease) stage 3, GFR 30-59 ml/min 02/27/2011  . LBBB (left bundle branch block) 02/27/2011  . Hypertension   . Hyperlipemia 02/27/2011  . Diabetes mellitus 02/27/2011    insulin dependent  . GERD (gastroesophageal reflux disease)   . CHF (congestive heart failure) (HCC)   . Blood transfusion     no reaction to transfusion per patient  . Arthritis   . Myocardial infarction (HCC)   . PVD (peripheral vascular disease) (HCC)     Carotid Angiography was performed in 2007, no treatment delivered at that time. Right carotid endarterectomy in 2009, being followed by duplex ultrasound. Doppler remains stable.  [name] CAD (coronary artery disease)     PTCA of the right coronary artery in  Feb. 1987, July 1987, and June 1990. Circumflex PTCA in July 1993, followed by a RCA, PTCA in Sept. 1994.  CABG in 1994x5 with a LIMA to the LAD, vein graft to the first diagonal, vein graft to the marginal and left circumflex coronary artery, and sequential graft to the PDA and PLA branches of the RCA  . HTN (hypertension)     Renal dopplers have been performed on a semi annual basis. Left renal artery demonstrated vessel narrowing with elevated velocities consistent with a 1-59% diameter reduction.   . carotid doppler     Left ECA : The proximal most aspect of the left external carotid artery demonstrates a peak systolic velocity of 493.9 cm/s. This is consistent with a 70-99% significant stenosis correlated with a 2007 angiogram.  . Paroxysmal atrial fibrillation (HCC)   . Deficiency anemia 08/05/2015    SURGICAL HISTORY: Past Surgical History  Procedure Laterality Date  . Coronary artery bypass graft  1994  . Spinal fusion    . Back surgery    . Carotid endarterectomy  2009  . Myoview scan  03/30/2011  . Left heart catheterization with coronary/graft angiogram N/A 02/28/2011    Procedure: LEFT HEART CATHETERIZATION WITH Isabel CapriceORONARY/GRAFT ANGIOGRAM;  Surgeon: Lennette Biharihomas A Kelly, MD;  [facility];  Service: Cardiovascular;  Laterality: N/A;  . Percutaneous coronary stent intervention (pci-s) N/A 03/02/2011    Procedure: PERCUTANEOUS CORONARY STENT INTERVENTION (PCI-S);  Surgeon: Lennette Biharihomas A Kelly, MD;  [facility];  Service: Cardiovascular;  Laterality: N/A;  . Esophagogastroduodenoscopy (egd) with propofol N/A 06/30/2015    Procedure: ESOPHAGOGASTRODUODENOSCOPY (EGD) WITH PROPOFOL;  Surgeon: Carman ChingJames Edwards, MD;  Location: [facility] ENDOSCOPY;  Service: Endoscopy;  Laterality: N/A;    SOCIAL HISTORY: Social History   Social History  . Marital Status: Married    Spouse Name: N/A  . Number of Children: N/A  . Years of Education: N/A   Occupational History  . Not on file.   Social History Main Topics  . Smoking status: Former Smoker -- 22 years    Quit date: 03/26/1958  . Smokeless  tobacco: Never Used     Comment: quit smoking in the 1960's  . Alcohol Use: No  . Drug Use: No  . Sexual Activity: Yes   Other Topics Concern  . Not on file   Social History Narrative    FAMILY HISTORY: Family History  Problem Relation Age of Onset  . Cancer Mother     unknown to pt  . Cancer Father     pancreas  . Cancer Paternal Grandfather     prostate    ALLERGIES:  is allergic to lisinopril.  MEDICATIONS:  Current Outpatient Prescriptions  Medication Sig Dispense Refill  . acetaminophen (TYLENOL) 325 MG tablet Take 650 mg by mouth every 6 (six) hours as needed for pain.    [name]. albuterol (PROVENTIL HFA;VENTOLIN HFA) 108 (90 Base) MCG/ACT inhaler Inhale 2 puffs into the lungs every 6 (six) hours as needed for wheezing or shortness of breath. 1 Inhaler 6  . amLODipine (NORVASC) 10 MG tablet Take 10 mg by mouth every morning.     [name]. apixaban (ELIQUIS) 2.5 MG TABS tablet Take 2 tablets (5 mg total) by mouth 2 (two) times daily. (Patient taking differently: Take 2.5 mg by mouth 2 (two) times daily. ) 180 tablet 3  . aspirin 81 MG tablet Take 81 mg by mouth at bedtime.     . budesonide-formoterol (SYMBICORT) 160-4.5 MCG/ACT inhaler Inhale 2 puffs  into the lungs 2 (two) times daily. 1 Inhaler 6  . carvedilol (COREG) 25 MG tablet TAKE 1&1/2 TABLETS BY MOUTH 2 TIMES DAILY WITH MEALS 270 tablet 3  . cholecalciferol (VITAMIN D) 1000 UNITS tablet Take 1,000 Units by mouth daily.    . diphenhydrAMINE (BENADRYL) 25 MG tablet Take 25 mg by mouth daily.    . ferrous sulfate 325 (65 FE) MG tablet Take 325 mg by mouth daily with breakfast.    . FOLIC ACID PO Take 600 mcg by mouth daily.    . furosemide (LASIX) 20 MG tablet Take 40 mg by mouth 2 (two) times daily.    [name] glipiZIDE (GLUCOTROL) 10 MG tablet Take 20 mg by mouth 2 (two) times daily.     . Glucosamine-Chondroitin (MOVE FREE PO) Take 1 tablet by mouth 2 (two) times daily.     . hydrALAZINE (APRESOLINE) 50 MG tablet Take 1 tablet (50  mg total) by mouth 2 (two) times daily. 180 tablet 1  . isosorbide mononitrate (IMDUR) 60 MG 24 hr tablet Take 60 mg by mouth daily.    . Multiple Vitamins-Minerals (MULTIVITAMINS THER. W/MINERALS) TABS Take 1 tablet by mouth daily.      . nitroGLYCERIN (NITROSTAT) 0.4 MG SL tablet Place 0.4 mg under the tongue every 5 (five) minutes as needed for chest pain.    [name] omeprazole (PRILOSEC) 20 MG capsule Take 20 mg by mouth 2 (two) times daily.     . pravastatin (PRAVACHOL) 40 MG tablet Take 40 mg by mouth at bedtime.       No current facility-administered medications for this visit.    REVIEW OF SYSTEMS:   Constitutional: Denies fevers, chills or abnormal night sweats Eyes: Denies blurriness of vision, double vision or watery eyes Ears, nose, mouth, throat, and face: Denies mucositis or sore throat Gastrointestinal:  Denies nausea, heartburn or change in bowel habits Skin: Denies abnormal skin rashes Lymphatics: Denies new lymphadenopathy or easy bruising Neurological:Denies numbness, tingling or new weaknesses Behavioral/Psych: Mood is stable, no new changes  All other systems were reviewed with the patient and are negative.  PHYSICAL EXAMINATION: ECOG PERFORMANCE STATUS: 3 - Symptomatic, >50% confined to bed  Filed Vitals:   08/05/15 1031  BP: 91/56  Pulse: 86  Temp: 97.5 F (36.4 C)  Resp: 17   Filed Weights   08/05/15 1031  Weight: 188 lb 9.6 oz (85.548 kg)    GENERAL:alert, no distress and comfortable. He looks frail, sitting on the wheelchair with oxygen delivered via nasal cannula SKIN: skin color is pale, texture, turgor are normal, no rashes or significant lesions. Noted skin bruising EYES: normal, conjunctiva are pale and non-injected, sclera clear OROPHARYNX:no exudate, no erythema and lips, buccal mucosa, and tongue normal  NECK: supple, thyroid normal size, non-tender, without nodularity LYMPH:  no palpable lymphadenopathy in the cervical, axillary or  inguinal LUNGS: clear to auscultation and percussion with normal breathing effort HEART: Irregular rate and rhythm, no murmurs with moderate bilateral lower extremity edema ABDOMEN:abdomen soft, non-tender and normal bowel sounds. Mild distention Musculoskeletal:no cyanosis of digits and no clubbing  PSYCH: alert & oriented x 3 with fluent speech NEURO: no focal motor/sensory deficits  RADIOGRAPHIC STUDIES: Last echocardiogram from 05/18/2015 show cardiomyopathy with reduced ejection fraction  ASSESSMENT & PLAN:  Anemia of chronic renal failure, stage 4 (severe) (HCC) The most likely cause of his current anemia is related to anemia of chronic renal failure. He had received multiple doses of IV iron. He also has  received multiple doses of ESA but not on a consistent basis. We discussed some of the risks, benefits, and alternatives of erythropoietin stimulating agents such as Procrit or Aranesp. The patient is symptomatic from anemia and the EPO level is low. Some of the side-effects to be expected including risks of allergic reactions, skin rashes, headaches, risk of blood clots including heart attack and stroke. There is rare risks of causing growth of cancers.The patient is willing to proceed and went ahead to sign consent today. We also discussed possibility of blood transfusion whenever hemoglobin is less than 8 g I will try to give him ESA with plan to get his hemoglobin greater than 11   PAF (paroxysmal atrial fibrillation) (HCC) The patient is on chronic anticoagulation therapy to prevent stroke. His recent renal function has declined and the [place] hospital has issues in the lateral declining refill for Eliquis. I will defer chronic anticoagulation management and choice of anticoagulation therapy to his cardiologist.  Congestive heart failure with cardiomyopathy (HCC) The patient has chronic congestive heart failure with significant leg edema. He is currently taking higher dose of  Lasix due to leg swelling. I would defer management to his cardiologist  COPD (chronic obstructive pulmonary disease) with emphysema (HCC) He is on chronic oxygen therapy under the direction of pulmonologist. Continued same  Deficiency anemia He has multifactorial anemia likely due to anemia of chronic disease, anemia secondary to chronic renal failure and possible low GI bleed due to chronic anticoagulation therapy. I will order additional workup and replace other deficiencies as needed  Chronic renal insufficiency, stage IV (severe) He has worsening renal failure secondary to aggressive diuretic therapy for congestive heart failure. I would defer chronic management to his nephrologist.    All questions were answered. The patient knows to call the clinic with any problems, questions or concerns. I spent 55 minutes counseling the patient face to face. The total time spent in the appointment was 60 minutes and more than 50% was on counseling.     Doak Mah, MD 08/05/2015 1:13 PM

## 2015-08-05 NOTE — Assessment & Plan Note (Signed)
History of CAD and ischemic cardiopathy.He had bypass grafting in 1994. His last catheterization performed by Dr. Tresa EndoKelly 03/02/11 was coupled here by a fractured wire tip.The LIMA to his LAD was patent as was the vein to diagonal branch, PDA and PLA. The vein to the marginal branch was occluded. His EF has been in the 30-35% range. He has class 3-4 symptoms He's had progressive shortness of breath and oxygen dependent, progressive lower extremity edema on higher doses of by mouth diuretics with worsening renal function and anemia. I think he has cardiorenal syndrome. I think his prognosis is poor. It was discussed advanced directives and I suggested home hospice. I'm going to increase his diuretics to 80 mg by mouth twice a day and refer him to the heart failure clinic to see if there are any other potential interventions although I suspect that there are not.

## 2015-08-05 NOTE — Telephone Encounter (Signed)
Per staff message and POF I have scheduled appts. Advised scheduler of appts. JMW  

## 2015-08-05 NOTE — Assessment & Plan Note (Signed)
History of hypertension blood pressure measured at 95/50. He is on amlodipine, carvedilol and hydralazine. Continue current meds at current dosing except and I'm going to decrease his amlodipine from10-5 mg a day.

## 2015-08-06 LAB — ERYTHROPOIETIN: Erythropoietin: 31.7 m[IU]/mL — ABNORMAL HIGH (ref 2.6–18.5)

## 2015-08-06 LAB — VITAMIN B12: Vitamin B12: 787 pg/mL (ref 211–946)

## 2015-08-08 ENCOUNTER — Ambulatory Visit: Payer: Medicare Other | Admitting: Nurse Practitioner

## 2015-08-08 ENCOUNTER — Telehealth: Payer: Self-pay | Admitting: Pulmonary Disease

## 2015-08-08 NOTE — Telephone Encounter (Signed)
Patient's wife called and said that Cardiologist has placed referral for Hospice care. Patient's Creatinine is 2.8,Not making any red blood cells, O2 is not good, Heart has been weakened because his kidneys are not working properly, cardiologist is concerned patient is going into CHF, Hematologist is still going to do the injections weekly to build up his blood, but Cardiologist is skeptical about it helping.   Patient's wife wants to know if Dr. Christene Slatese Dios wants patient to still have CT on 05/22 and follow up with him on 07/06.   Dr. Christene Slatese Dios, please advise.

## 2015-08-08 NOTE — Telephone Encounter (Signed)
I think we can play it by ear.  They can call before the next schedule appointment and touch base with us. Thanks  AD

## 2015-08-09 NOTE — Telephone Encounter (Signed)
Spoke with pt's wife and she would like to cancel the CT scan as pt is not doing well. She will keep the 7/6 appt and see how pt is doing closer to that time. CT scan canceled. Nothing further needed.

## 2015-08-10 ENCOUNTER — Ambulatory Visit (HOSPITAL_BASED_OUTPATIENT_CLINIC_OR_DEPARTMENT_OTHER): Payer: Medicare Other

## 2015-08-10 ENCOUNTER — Other Ambulatory Visit (HOSPITAL_BASED_OUTPATIENT_CLINIC_OR_DEPARTMENT_OTHER): Payer: Medicare Other

## 2015-08-10 VITALS — BP 102/55 | HR 78 | Temp 97.8°F

## 2015-08-10 DIAGNOSIS — N183 Chronic kidney disease, stage 3 unspecified: Secondary | ICD-10-CM

## 2015-08-10 DIAGNOSIS — D631 Anemia in chronic kidney disease: Secondary | ICD-10-CM

## 2015-08-10 DIAGNOSIS — D638 Anemia in other chronic diseases classified elsewhere: Secondary | ICD-10-CM

## 2015-08-10 DIAGNOSIS — N184 Chronic kidney disease, stage 4 (severe): Secondary | ICD-10-CM

## 2015-08-10 DIAGNOSIS — D539 Nutritional anemia, unspecified: Secondary | ICD-10-CM

## 2015-08-10 LAB — CBC WITH DIFFERENTIAL/PLATELET
BASO%: 0.8 % (ref 0.0–2.0)
BASOS ABS: 0 10*3/uL (ref 0.0–0.1)
EOS ABS: 0.2 10*3/uL (ref 0.0–0.5)
EOS%: 3.9 % (ref 0.0–7.0)
HEMATOCRIT: 26.3 % — AB (ref 38.4–49.9)
HEMOGLOBIN: 8.4 g/dL — AB (ref 13.0–17.1)
LYMPH#: 0.4 10*3/uL — AB (ref 0.9–3.3)
LYMPH%: 10.1 % — ABNORMAL LOW (ref 14.0–49.0)
MCH: 31.4 pg (ref 27.2–33.4)
MCHC: 32 g/dL (ref 32.0–36.0)
MCV: 98.2 fL — AB (ref 79.3–98.0)
MONO#: 0.5 10*3/uL (ref 0.1–0.9)
MONO%: 11.6 % (ref 0.0–14.0)
NEUT#: 3.2 10*3/uL (ref 1.5–6.5)
NEUT%: 73.6 % (ref 39.0–75.0)
Platelets: 159 10*3/uL (ref 140–400)
RBC: 2.68 10*6/uL — ABNORMAL LOW (ref 4.20–5.82)
RDW: 18.5 % — ABNORMAL HIGH (ref 11.0–14.6)
WBC: 4.3 10*3/uL (ref 4.0–10.3)

## 2015-08-10 MED ORDER — DARBEPOETIN ALFA 300 MCG/0.6ML IJ SOSY
300.0000 ug | PREFILLED_SYRINGE | Freq: Once | INTRAMUSCULAR | Status: AC
  Administered 2015-08-10: 300 ug via SUBCUTANEOUS
  Filled 2015-08-10: qty 0.6

## 2015-08-14 ENCOUNTER — Encounter (HOSPITAL_COMMUNITY): Payer: Self-pay

## 2015-08-14 ENCOUNTER — Emergency Department (HOSPITAL_COMMUNITY)
Admission: EM | Admit: 2015-08-14 | Discharge: 2015-08-14 | Disposition: A | Discharge status: Home / Self Care | Attending: Emergency Medicine | Admitting: Emergency Medicine

## 2015-08-14 ENCOUNTER — Emergency Department (HOSPITAL_COMMUNITY)

## 2015-08-14 DIAGNOSIS — I252 Old myocardial infarction: Secondary | ICD-10-CM | POA: Diagnosis not present

## 2015-08-14 DIAGNOSIS — Z7982 Long term (current) use of aspirin: Secondary | ICD-10-CM | POA: Insufficient documentation

## 2015-08-14 DIAGNOSIS — N189 Chronic kidney disease, unspecified: Secondary | ICD-10-CM

## 2015-08-14 DIAGNOSIS — E162 Hypoglycemia, unspecified: Secondary | ICD-10-CM

## 2015-08-14 DIAGNOSIS — D649 Anemia, unspecified: Secondary | ICD-10-CM | POA: Diagnosis not present

## 2015-08-14 DIAGNOSIS — E1122 Type 2 diabetes mellitus with diabetic chronic kidney disease: Secondary | ICD-10-CM | POA: Insufficient documentation

## 2015-08-14 DIAGNOSIS — E11649 Type 2 diabetes mellitus with hypoglycemia without coma: Secondary | ICD-10-CM | POA: Insufficient documentation

## 2015-08-14 DIAGNOSIS — E785 Hyperlipidemia, unspecified: Secondary | ICD-10-CM | POA: Diagnosis not present

## 2015-08-14 DIAGNOSIS — Z9889 Other specified postprocedural states: Secondary | ICD-10-CM | POA: Insufficient documentation

## 2015-08-14 DIAGNOSIS — I4891 Unspecified atrial fibrillation: Secondary | ICD-10-CM | POA: Diagnosis not present

## 2015-08-14 DIAGNOSIS — Z87891 Personal history of nicotine dependence: Secondary | ICD-10-CM | POA: Diagnosis not present

## 2015-08-14 DIAGNOSIS — E876 Hypokalemia: Secondary | ICD-10-CM | POA: Insufficient documentation

## 2015-08-14 DIAGNOSIS — I129 Hypertensive chronic kidney disease with stage 1 through stage 4 chronic kidney disease, or unspecified chronic kidney disease: Secondary | ICD-10-CM | POA: Insufficient documentation

## 2015-08-14 DIAGNOSIS — K219 Gastro-esophageal reflux disease without esophagitis: Secondary | ICD-10-CM | POA: Insufficient documentation

## 2015-08-14 DIAGNOSIS — R4781 Slurred speech: Secondary | ICD-10-CM | POA: Insufficient documentation

## 2015-08-14 DIAGNOSIS — I509 Heart failure, unspecified: Secondary | ICD-10-CM | POA: Insufficient documentation

## 2015-08-14 DIAGNOSIS — Z79899 Other long term (current) drug therapy: Secondary | ICD-10-CM | POA: Diagnosis not present

## 2015-08-14 DIAGNOSIS — N184 Chronic kidney disease, stage 4 (severe): Secondary | ICD-10-CM | POA: Diagnosis present

## 2015-08-14 DIAGNOSIS — M1990 Unspecified osteoarthritis, unspecified site: Secondary | ICD-10-CM | POA: Insufficient documentation

## 2015-08-14 DIAGNOSIS — I25119 Atherosclerotic heart disease of native coronary artery with unspecified angina pectoris: Secondary | ICD-10-CM | POA: Insufficient documentation

## 2015-08-14 DIAGNOSIS — Z7984 Long term (current) use of oral hypoglycemic drugs: Secondary | ICD-10-CM | POA: Diagnosis not present

## 2015-08-14 DIAGNOSIS — Z7901 Long term (current) use of anticoagulants: Secondary | ICD-10-CM | POA: Diagnosis not present

## 2015-08-14 DIAGNOSIS — I10 Essential (primary) hypertension: Secondary | ICD-10-CM

## 2015-08-14 DIAGNOSIS — Z951 Presence of aortocoronary bypass graft: Secondary | ICD-10-CM | POA: Diagnosis not present

## 2015-08-14 DIAGNOSIS — I255 Ischemic cardiomyopathy: Secondary | ICD-10-CM | POA: Diagnosis not present

## 2015-08-14 DIAGNOSIS — N183 Chronic kidney disease, stage 3 (moderate): Secondary | ICD-10-CM | POA: Diagnosis not present

## 2015-08-14 DIAGNOSIS — R531 Weakness: Secondary | ICD-10-CM | POA: Diagnosis present

## 2015-08-14 LAB — RAPID URINE DRUG SCREEN, HOSP PERFORMED
AMPHETAMINES: NOT DETECTED
BARBITURATES: NOT DETECTED
Benzodiazepines: POSITIVE — AB
Cocaine: NOT DETECTED
Opiates: NOT DETECTED
TETRAHYDROCANNABINOL: NOT DETECTED

## 2015-08-14 LAB — COMPREHENSIVE METABOLIC PANEL
ALK PHOS: 70 U/L (ref 38–126)
ALT: 12 U/L — AB (ref 17–63)
AST: 19 U/L (ref 15–41)
Albumin: 3.3 g/dL — ABNORMAL LOW (ref 3.5–5.0)
Anion gap: 10 (ref 5–15)
BUN: 57 mg/dL — AB (ref 6–20)
CALCIUM: 9 mg/dL (ref 8.9–10.3)
CO2: 25 mmol/L (ref 22–32)
CREATININE: 2.4 mg/dL — AB (ref 0.61–1.24)
Chloride: 102 mmol/L (ref 101–111)
GFR calc non Af Amer: 24 mL/min — ABNORMAL LOW (ref >60)
GFR, EST AFRICAN AMERICAN: 28 mL/min — AB (ref >60)
Glucose, Bld: 88 mg/dL (ref 65–99)
Potassium: 2.8 mmol/L — ABNORMAL LOW (ref 3.5–5.1)
SODIUM: 137 mmol/L (ref 135–145)
Total Bilirubin: 0.8 mg/dL (ref 0.3–1.2)
Total Protein: 6.4 g/dL — ABNORMAL LOW (ref 6.5–8.1)

## 2015-08-14 LAB — DIFFERENTIAL
Basophils Absolute: 0 10*3/uL (ref 0.0–0.1)
Basophils Relative: 1 %
Eosinophils Absolute: 0.1 10*3/uL (ref 0.0–0.7)
Eosinophils Relative: 3 %
LYMPHS PCT: 10 %
Lymphs Abs: 0.4 10*3/uL — ABNORMAL LOW (ref 0.7–4.0)
MONO ABS: 0.4 10*3/uL (ref 0.1–1.0)
MONOS PCT: 9 %
NEUTROS ABS: 3.3 10*3/uL (ref 1.7–7.7)
Neutrophils Relative %: 77 %

## 2015-08-14 LAB — CBG MONITORING, ED
GLUCOSE-CAPILLARY: 107 mg/dL — AB (ref 65–99)
GLUCOSE-CAPILLARY: 110 mg/dL — AB (ref 65–99)
Glucose-Capillary: 87 mg/dL (ref 65–99)
Glucose-Capillary: 88 mg/dL (ref 65–99)
Glucose-Capillary: 102 mg/dL — ABNORMAL HIGH (ref 65–99)

## 2015-08-14 LAB — I-STAT CHEM 8, ED
BUN: 51 mg/dL — AB (ref 6–20)
CALCIUM ION: 1.15 mmol/L (ref 1.13–1.30)
CHLORIDE: 100 mmol/L — AB (ref 101–111)
Creatinine, Ser: 2.4 mg/dL — ABNORMAL HIGH (ref 0.61–1.24)
GLUCOSE: 87 mg/dL (ref 65–99)
HCT: 29 % — ABNORMAL LOW (ref 39.0–52.0)
Hemoglobin: 9.9 g/dL — ABNORMAL LOW (ref 13.0–17.0)
Potassium: 2.8 mmol/L — ABNORMAL LOW (ref 3.5–5.1)
Sodium: 140 mmol/L (ref 135–145)
TCO2: 24 mmol/L (ref 0–100)

## 2015-08-14 LAB — URINALYSIS, ROUTINE W REFLEX MICROSCOPIC
BILIRUBIN URINE: NEGATIVE
Glucose, UA: NEGATIVE mg/dL
Hgb urine dipstick: NEGATIVE
KETONES UR: NEGATIVE mg/dL
Leukocytes, UA: NEGATIVE
NITRITE: NEGATIVE
PROTEIN: NEGATIVE mg/dL
Specific Gravity, Urine: 1.012 (ref 1.005–1.030)
pH: 5 (ref 5.0–8.0)

## 2015-08-14 LAB — PROTIME-INR
INR: 2.37 — ABNORMAL HIGH (ref 0.00–1.49)
Prothrombin Time: 25.6 seconds — ABNORMAL HIGH (ref 11.6–15.2)

## 2015-08-14 LAB — CBC
HEMATOCRIT: 28.5 % — AB (ref 39.0–52.0)
Hemoglobin: 8.9 g/dL — ABNORMAL LOW (ref 13.0–17.0)
MCH: 30.6 pg (ref 26.0–34.0)
MCHC: 31.2 g/dL (ref 30.0–36.0)
MCV: 97.9 fL (ref 78.0–100.0)
PLATELETS: 158 10*3/uL (ref 150–400)
RBC: 2.91 MIL/uL — AB (ref 4.22–5.81)
RDW: 17.1 % — ABNORMAL HIGH (ref 11.5–15.5)
WBC: 4.2 10*3/uL (ref 4.0–10.5)

## 2015-08-14 LAB — I-STAT TROPONIN, ED: Troponin i, poc: 0.02 ng/mL (ref 0.00–0.08)

## 2015-08-14 LAB — APTT: aPTT: 40 seconds — ABNORMAL HIGH (ref 24–37)

## 2015-08-14 LAB — ETHANOL: Alcohol, Ethyl (B): <5 mg/dL (ref <5)

## 2015-08-14 MED ORDER — POTASSIUM CHLORIDE CRYS ER 20 MEQ PO TBCR
40.0000 meq | EXTENDED_RELEASE_TABLET | Freq: Once | ORAL | Status: AC
  Administered 2015-08-14: 40 meq via ORAL
  Filled 2015-08-14: qty 2

## 2015-08-14 MED ORDER — POTASSIUM CHLORIDE 10 MEQ/100ML IV SOLN
10.0000 meq | INTRAVENOUS | Status: AC
  Administered 2015-08-14: 10 meq via INTRAVENOUS
  Filled 2015-08-14: qty 100

## 2015-08-14 MED ORDER — FREESTYLE SYSTEM KIT: 1.0000 | PACK | Status: AC | PRN

## 2015-08-14 NOTE — ED Notes (Addendum)
Pt arrives EMS with c/o weakness, unable to walk and slurred speech. Started hospice this week for renal and heart failure. Pt went to bed normal at 2000 and woke at 0900 with weakness and slurred speech. Called rescue at 1510 and CBG 38 given oral glucose 15 gm came to 85. Recheck w 63. Given 1/2 amp D50 and was 108 by EMS. CBG now 87.

## 2015-08-14 NOTE — ED Notes (Signed)
Admitting MD at bedside.

## 2015-08-14 NOTE — ED Notes (Signed)
Pt returns from CT.

## 2015-08-14 NOTE — ED Provider Notes (Addendum)
CSN: [id number]     Arrival date & time 08/14/15  1632 History   First MD Initiated Contact with Patient 08/14/15 1639     No chief complaint on file.    (Consider location/radiation/quality/duration/timing/severity/associated sxs/prior Treatment) The history is provided by the patient, the spouse and the EMS personnel.  [name] is a 82 y.o. male hx of CAD, end stage CHF on hospice, HTN here with slurred speech, hypoglycemia. Patient took sleeping pill a p.m. yesterday to go to bed. As per the wife, he woke up around 9:30 AM this morning and was weak. She talked to him and he was a little tired but did not have surgery speech at that time. Around 11 AM she talked to him again and his speech was slurred. His son came to the house around noon and the speech remained slurred so they called hospice. Hospice nurse arrived around 1 PM and check his blood sugar and it was 38. He was given some orange juice and also given half amp of D50 and blood sugar went up to 108 then now back to 87 on arrival. Per the wife, his speech is back to normal now.    Level V caveat- AMS   Past Medical History  Diagnosis Date  . Angina at rest [facility](HCC) 02/27/2011  . CAD (coronary artery disease) 02/27/2011    Catheterized on 02/28/11, graft supplying marginal vessel was occluded, after 48 hours of hydration, a repeat cath was performed in an attempt at PCI 03/02/11 cath had an unsuccessful attempt at PCI to an occluded calcified circumflex vessel, Residual distal tip of the [facility] PT moderate support wire at the subtotal occlusion. No change in the subtotal occlusion,improved retrograde collateralization  . LV dysfunction 02/27/2011  . CKD (chronic kidney disease) stage 3, GFR 30-59 ml/min 02/27/2011  . LBBB (left bundle branch block) 02/27/2011  . Hypertension   . Hyperlipemia 02/27/2011  . Diabetes mellitus 02/27/2011    insulin dependent  . GERD (gastroesophageal reflux disease)   . CHF (congestive heart failure)  (HCC)   . Blood transfusion     no reaction to transfusion per patient  . Arthritis   . Myocardial infarction (HCC)   . PVD (peripheral vascular disease) (HCC)     Carotid Angiography was performed in 2007, no treatment delivered at that time. Right carotid endarterectomy in 2009, being followed by duplex ultrasound. Doppler remains stable.  [name]. CAD (coronary artery disease)     PTCA of the right coronary artery in Feb. 1987, July 1987, and June 1990. Circumflex PTCA in July 1993, followed by a RCA, PTCA in Sept. 1994. CABG in 1994x5 with a LIMA to the LAD, vein graft to the first diagonal, vein graft to the marginal and left circumflex coronary artery, and sequential graft to the PDA and PLA branches of the RCA  . HTN (hypertension)     Renal dopplers have been performed on a semi annual basis. Left renal artery demonstrated vessel narrowing with elevated velocities consistent with a 1-59% diameter reduction.   . carotid doppler     Left ECA : The proximal most aspect of the left external carotid artery demonstrates a peak systolic velocity of 493.9 cm/s. This is consistent with a 70-99% significant stenosis correlated with a 2007 angiogram.  . Paroxysmal atrial fibrillation (HCC)   . Deficiency anemia 08/05/2015   Past Surgical History  Procedure Laterality Date  . Coronary artery bypass graft  1994  . Spinal fusion    .  Back surgery    . Carotid endarterectomy  2009  . Myoview scan  03/30/2011  . Left heart catheterization with coronary/graft angiogram N/A 02/28/2011    Procedure: LEFT HEART CATHETERIZATION WITH Isabel Caprice;  Surgeon: Lennette Bihari, MD;  Location: [facility] CATH LAB;  Service: Cardiovascular;  Laterality: N/A;  . Percutaneous coronary stent intervention (pci-s) N/A 03/02/2011    Procedure: PERCUTANEOUS CORONARY STENT INTERVENTION (PCI-S);  Surgeon: Lennette Bihari, MD;  Location: [facility] CATH LAB;  Service: Cardiovascular;  Laterality: N/A;  . Esophagogastroduodenoscopy (egd)  with propofol N/A 06/30/2015    Procedure: ESOPHAGOGASTRODUODENOSCOPY (EGD) WITH PROPOFOL;  Surgeon: Carman Ching, MD;  Location: [facility] ENDOSCOPY;  Service: Endoscopy;  Laterality: N/A;   Family History  Problem Relation Age of Onset  . Cancer Mother     unknown to pt  . Cancer Father     pancreas  . Cancer Paternal Grandfather     prostate   Social History  Substance Use Topics  . Smoking status: Former Smoker -- 22 years    Quit date: 03/26/1958  . Smokeless tobacco: Never Used     Comment: quit smoking in the 1960's  . Alcohol Use: No    Review of Systems  Neurological: Positive for speech difficulty.  All other systems reviewed and are negative.     Allergies  Lisinopril  Home Medications   Prior to Admission medications   Medication Sig Start Date End Date Taking? Authorizing Provider  albuterol (PROVENTIL HFA;VENTOLIN HFA) 108 (90 Base) MCG/ACT inhaler Inhale 2 puffs into the lungs every 6 (six) hours as needed for wheezing or shortness of breath. 06/16/15  Yes Jose Alexis Frock, MD  acetaminophen (TYLENOL) 325 MG tablet Take 650 mg by mouth every 6 (six) hours as needed for pain.    Historical Provider, MD  amLODipine (NORVASC) 5 MG tablet Take 1 tablet (5 mg total) by mouth daily. 08/05/15   [name], MD  apixaban (ELIQUIS) 2.5 MG TABS tablet Take 2 tablets (5 mg total) by mouth 2 (two) times daily. Patient taking differently: Take 2.5 mg by mouth 2 (two) times daily.  01/20/15   [name], MD  aspirin 81 MG tablet Take 81 mg by mouth at bedtime.     Historical Provider, MD  budesonide-formoterol (SYMBICORT) 160-4.5 MCG/ACT inhaler Inhale 2 puffs into the lungs 2 (two) times daily. 05/04/15   Jose Alexis Frock, MD  carvedilol (COREG) 25 MG tablet TAKE 1&1/2 TABLETS BY MOUTH 2 TIMES DAILY WITH MEALS 01/21/15   [name], MD  cholecalciferol (VITAMIN D) 1000 UNITS tablet Take 1,000 Units by mouth daily.    Historical Provider, MD   diphenhydrAMINE (BENADRYL) 25 MG tablet Take 25 mg by mouth daily.    Historical Provider, MD  ferrous sulfate 325 (65 FE) MG tablet Take 325 mg by mouth daily with breakfast.    Historical Provider, MD  FOLIC ACID PO Take 600 mcg by mouth daily.    Historical Provider, MD  furosemide (LASIX) 40 MG tablet Take 2 tablets (80 mg total) by mouth 2 (two) times daily. 08/05/15   [name], MD  glipiZIDE (GLUCOTROL) 10 MG tablet Take 20 mg by mouth 2 (two) times daily.     Historical Provider, MD  Glucosamine-Chondroitin (MOVE FREE PO) Take 1 tablet by mouth 2 (two) times daily.     Historical Provider, MD  hydrALAZINE (APRESOLINE) 50 MG tablet Take 1 tablet (50 mg total) by mouth 2 (  two) times daily. 11/20/13   [name], MD  isosorbide mononitrate (IMDUR) 60 MG 24 hr tablet Take 60 mg by mouth daily.    Historical Provider, MD  Multiple Vitamins-Minerals (MULTIVITAMINS THER. W/MINERALS) TABS Take 1 tablet by mouth daily.      Historical Provider, MD  nitroGLYCERIN (NITROSTAT) 0.4 MG SL tablet Place 0.4 mg under the tongue every 5 (five) minutes as needed for chest pain.    Historical Provider, MD  omeprazole (PRILOSEC) 20 MG capsule Take 20 mg by mouth 2 (two) times daily.     Historical Provider, MD  pravastatin (PRAVACHOL) 40 MG tablet Take 40 mg by mouth at bedtime.      Historical Provider, MD   BP 118/65 mmHg  Pulse 84  Temp(Src) 97.5 F (36.4 C) (Oral)  Resp 16  Ht 5\' 9"  (1.753 m)  Wt 190 lb (86.183 kg)  BMI 28.05 kg/m2  SpO2 97% Physical Exam  Constitutional: He is oriented to person, place, and time.  Chronically ill appearing   HENT:  Head: Normocephalic.  Mouth/Throat: Oropharynx is clear and moist.  Eyes: Conjunctivae are normal. Pupils are equal, round, and reactive to light.  Neck: Normal range of motion. Neck supple.  Cardiovascular: Normal rate, regular rhythm and normal heart sounds.   Pulmonary/Chest: Effort normal.  Diminished bilateral bases (chronic per  wife)   Abdominal: Soft. Bowel sounds are normal. He exhibits no distension. There is no tenderness. There is no rebound.  Musculoskeletal: Normal range of motion.  1+ edema bilaterally   Neurological: He is alert and oriented to person, place, and time. No cranial nerve deficit. Coordination normal.  CN 2-12 intact. No obvious facial droop. No obvious slurred speech. Nl strength throughout   Skin: Skin is warm and dry.  Psychiatric: He has a normal mood and affect. His behavior is normal. Judgment normal.  Nursing note and vitals reviewed.   ED Course  Procedures (including critical care time) Labs Review Labs Reviewed  PROTIME-INR - Abnormal; Notable for the following:    Prothrombin Time 25.6 (*)    INR 2.37 (*)    All other components within normal limits  APTT - Abnormal; Notable for the following:    aPTT 40 (*)    All other components within normal limits  CBC - Abnormal; Notable for the following:    RBC 2.91 (*)    Hemoglobin 8.9 (*)    HCT 28.5 (*)    RDW 17.1 (*)    All other components within normal limits  DIFFERENTIAL - Abnormal; Notable for the following:    Lymphs Abs 0.4 (*)    All other components within normal limits  COMPREHENSIVE METABOLIC PANEL - Abnormal; Notable for the following:    Potassium 2.8 (*)    BUN 57 (*)    Creatinine, Ser 2.40 (*)    Total Protein 6.4 (*)    Albumin 3.3 (*)    ALT 12 (*)    GFR calc non Af Amer 24 (*)    GFR calc Af Amer 28 (*)    All other components within normal limits  I-STAT CHEM 8, ED - Abnormal; Notable for the following:    Potassium 2.8 (*)    Chloride 100 (*)    BUN 51 (*)    Creatinine, Ser 2.40 (*)    Hemoglobin 9.9 (*)    HCT 29.0 (*)    All other components within normal limits  CBG MONITORING, ED - Abnormal; Notable for the  following:    Glucose-Capillary 107 (*)    All other components within normal limits  ETHANOL  URINALYSIS, ROUTINE W REFLEX MICROSCOPIC (NOT AT [facility])  URINE RAPID DRUG SCREEN,  HOSP PERFORMED  CBG MONITORING, ED  I-STAT TROPOININ, ED    Imaging Review Ct Head Wo Contrast  08/14/2015  CLINICAL DATA:  Status post fall on May 14th. Injury to back of head. Slight headache. On blood thinners. EXAM: CT HEAD WITHOUT CONTRAST TECHNIQUE: Contiguous axial images were obtained from the base of the skull through the vertex without intravenous contrast. COMPARISON:  Head CT dated 06/27/2015. FINDINGS: Brain: Generalized age related brain atrophy with commensurate dilatation of the ventricles and sulci. Mild chronic small vessel ischemic change in the deep periventricular white matter regions bilaterally. Small old lacunar infarcts within the basal ganglia. No mass, hemorrhage, edema or other evidence of acute parenchymal abnormality. No extra-axial hemorrhage. Vascular: No hyperdense vessel or unexpected calcification. There are chronic calcified atherosclerotic changes of the large vessels at the skull base. Skull: Negative for fracture or focal lesion. Sinuses/Orbits: Mucosal thickening within the right sphenoid sinus, of uncertain chronicity, most likely chronic. Other: No superficial soft tissue hematoma appreciated. IMPRESSION: 1. Negative head CT. No intracranial hemorrhage or edema. No skull fracture. 2. Right sphenoid sinus disease, of uncertain chronicity, most likely chronic. Electronically Signed   By: Bary Richard M.D.   On: 08/14/2015 17:38   I have personally reviewed and evaluated these images and lab results as part of my medical decision-making.   EKG Interpretation   Date/Time:  Sunday Aug 14 2015 17:27:25 EDT Ventricular Rate:  76 PR Interval:    QRS Duration: 195 QT Interval:  497 QTC Calculation: 559 R Axis:   -63 Text Interpretation:  Atrial fibrillation Ventricular premature complex  Left bundle branch block No significant change since last tracing  Confirmed by Jovee Dettinger  MD, Xylina Rhoads ([id number]) on 08/14/2015 6:47:35 PM      MDM   Final diagnoses:  None    [name] is a 82 y.o. male here with slurred speech, weakness. Consider stroke vs hypoglycemia. Symptoms resolved and outside the 4 hr TPA window. Patient also on eliquis. I consulted Dr. Lavon Paganini from neurology, who saw patient and agrees with me and recommend stroke workup and monitoring CBG.      7:14 PM Dr. Lavon Paganini saw patient and recommend MRI, stroke workup. Labs showed K 2.8, nl glucose. Glucose stable in the ED for now. Will admit for observation, stroke workup.   9:56 PM Dr. Julian Reil, hospitalist, saw patient. Had long discussion with patient and family regarding workup and potential treatment options and offered admission vs outpatient followup. They don't want to do a full stroke workup. CBG stable over 5 hrs in the ED. Patient's K 2.8 and was supplemented. Suppose to take Kdur but didn't take it yet. Will dc glipizide for now, will prescribe glucometer to go home with.    Richardean Canal, MD 08/14/15 1914  Richardean Canal, MD 08/14/15 2159

## 2015-08-14 NOTE — ED Notes (Signed)
Pt stands with assist at bedside for urine

## 2015-08-14 NOTE — Discharge Instructions (Signed)
Stop taking glipizide.   Take your potassium as prescribed by Dr. Allyson SabalBerry.   Continue lasix and other medications.   Check your blood sugar daily and keep a record.   Repeat BMP in a week with your doctor.   See your doctor in 2-3 days   Return to ER if his blood sugar is less than 60 or greater than 500, slurred speech, weakness.

## 2015-08-14 NOTE — Consult Note (Signed)
Medical Consultation   [name]  AVW:[id number]  DOB: Apr 30, 1932  DOA: 08/14/2015  PCP: Nanetta Batty, MD   Outpatient Specialists: [facility] cards   Requesting physician: Dr. Silverio Lay  Reason for consultation: Slurred Speech   History of Present Illness: [name] is an 82 y.o. male h/o CAD, ischemic cardiomyopathy with cardio-renal syndrome on home hospice, CHF is NYHA class 3-4, carotid artery disease with CEA on R some years ago, 60-70% stenosis on L which has slightly progressed as of January of this year, DM2.  Patient presents to ED with slurred speech, hypoglycemia.  Patient took sleeping pill yesterday to go to bed.  As per wife he woke up around 9:30 AM this morning and was generally weak.  Around 11 AM he had slurred speech that she noted.  Son came to house around noon and speech remained slurred so they called hospice.  Hospice nurse arrived around 1pm, checked his BGL and it was 38.  He was given some OJ and half amp of d50  BGL went to 108 then 87 on arrival to ED.  On arrival to ED his speech was back to normal and patient was asymptomatic.  Serial BGs in ED have been 107, and finally 110.  CT head negative.   Review of Systems:  ROS As per HPI otherwise 10 point review of systems negative.    Past Medical History: Past Medical History  Diagnosis Date  . Angina at rest [facility]) 02/27/2011  . CAD (coronary artery disease) 02/27/2011    Catheterized on 02/28/11, graft supplying marginal vessel was occluded, after 48 hours of hydration, a repeat cath was performed in an attempt at PCI 03/02/11 cath had an unsuccessful attempt at PCI to an occluded calcified circumflex vessel, Residual distal tip of the Cho[employer] PT moderate support wire at the subtotal occlusion. No change in the subtotal occlusion,improved retrograde collateralization  . LV dysfunction 02/27/2011  . CKD (chronic kidney disease) stage 3, GFR 30-59 ml/min 02/27/2011  . LBBB (left bundle  branch block) 02/27/2011  . Hypertension   . Hyperlipemia 02/27/2011  . Diabetes mellitus 02/27/2011    insulin dependent  . GERD (gastroesophageal reflux disease)   . CHF (congestive heart failure) (HCC)   . Blood transfusion     no reaction to transfusion per patient  . Arthritis   . Myocardial infarction (HCC)   . PVD (peripheral vascular disease) (HCC)     Carotid Angiography was performed in 2007, no treatment delivered at that time. Right carotid endarterectomy in 2009, being followed by duplex ultrasound. Doppler remains stable.  [name] CAD (coronary artery disease)     PTCA of the right coronary artery in Feb. 1987, July 1987, and June 1990. Circumflex PTCA in July 1993, followed by a RCA, PTCA in Sept. 1994. CABG in 1994x5 with a LIMA to the LAD, vein graft to the first diagonal, vein graft to the marginal and left circumflex coronary artery, and sequential graft to the PDA and PLA branches of the RCA  . HTN (hypertension)     Renal dopplers have been performed on a semi annual basis. Left renal artery demonstrated vessel narrowing with elevated velocities consistent with a 1-59% diameter reduction.   . carotid doppler     Left ECA : The proximal most aspect of the left external carotid artery demonstrates a peak systolic velocity of 493.9 cm/s. This is consistent with a 70-99%  significant stenosis correlated with a 2007 angiogram.  . Paroxysmal atrial fibrillation (HCC)   . Deficiency anemia 08/05/2015    Past Surgical History: Past Surgical History  Procedure Laterality Date  . Coronary artery bypass graft  1994  . Spinal fusion    . Back surgery    . Carotid endarterectomy  2009  . Myoview scan  03/30/2011  . Left heart catheterization with coronary/graft angiogram N/A 02/28/2011    Procedure: LEFT HEART CATHETERIZATION WITH Isabel CapriceORONARY/GRAFT ANGIOGRAM;  Surgeon: Lennette Biharihomas A Kelly, MD;  [facility];  Service: Cardiovascular;  Laterality: N/A;  . Percutaneous coronary stent  intervention (pci-s) N/A 03/02/2011    Procedure: PERCUTANEOUS CORONARY STENT INTERVENTION (PCI-S);  Surgeon: Lennette Biharihomas A Kelly, MD;  [facility];  Service: Cardiovascular;  Laterality: N/A;  . Esophagogastroduodenoscopy (egd) with propofol N/A 06/30/2015    Procedure: ESOPHAGOGASTRODUODENOSCOPY (EGD) WITH PROPOFOL;  Surgeon: Carman ChingJames Edwards, MD;  Location: [facility] ENDOSCOPY;  Service: Endoscopy;  Laterality: N/A;     Allergies:   Allergies  Allergen Reactions  . Lisinopril Cough     Social History:  reports that he quit smoking about 57 years ago. He has never used smokeless tobacco. He reports that he does not drink alcohol or use illicit drugs.   Family History: Family History  Problem Relation Age of Onset  . Cancer Mother     unknown to pt  . Cancer Father     pancreas  . Cancer Paternal Grandfather     prostate       Physical Exam: Filed Vitals:   08/14/15 1642 08/14/15 1645 08/14/15 1724 08/14/15 1730  BP: 109/6 111/73 121/81 118/65  Pulse: 73 61 76 84  Temp: 97.5 F (36.4 C)     TempSrc: Oral     Resp: 20  11 16   Height: 5\' 9"  (1.753 m)     Weight: 86.183 kg (190 lb)     SpO2: 96% 97% 96% 97%    Constitutional: Alert and awake, oriented x3, not in any acute distress. Eyes: PERLA, EOMI, irises appear normal, anicteric sclera,  ENMT: external ears and nose appear normal            Lips appears normal, oropharynx mucosa, tongue, posterior pharynx appear normal  Neck: neck appears normal, no masses, normal ROM, no thyromegaly, no JVD  CVS: S1-S2 clear, no murmur rubs or gallops, no LE edema, normal pedal pulses  Respiratory:  clear to auscultation bilaterally, no wheezing, rales or rhonchi. Respiratory effort normal. No accessory muscle use.  Abdomen: soft nontender, nondistended, normal bowel sounds, no hepatosplenomegaly, no hernias  Musculoskeletal: : no cyanosis, clubbing or edema noted bilaterally Neuro: Cranial nerves II-XII intact, strength, sensation,  reflexes Psych: judgement and insight appear normal, stable mood and affect, mental status Skin: no rashes or lesions or ulcers, no induration or nodules    Data reviewed:  I have personally reviewed following labs and imaging studies Labs:  CBC:  Recent Labs Lab 08/10/15 0950 08/14/15 1709 08/14/15 1711  WBC 4.3 4.2  --   NEUTROABS 3.2 3.3  --   HGB 8.4* 8.9* 9.9*  HCT 26.3* 28.5* 29.0*  MCV 98.2* 97.9  --   PLT 159 158  --     Basic Metabolic Panel:  Recent Labs Lab 08/14/15 1709 08/14/15 1711  NA 137 140  K 2.8* 2.8*  CL 102 100*  CO2 25  --   GLUCOSE 88 87  BUN 57* 51*  CREATININE 2.40* 2.40*  CALCIUM  9.0  --    GFR Estimated Creatinine Clearance: 26.7 mL/min (by C-G formula based on Cr of 2.4). Liver Function Tests:  Recent Labs Lab 08/14/15 1709  AST 19  ALT 12*  ALKPHOS 70  BILITOT 0.8  PROT 6.4*  ALBUMIN 3.3*   No results for input(s): LIPASE, AMYLASE in the last 168 hours. No results for input(s): AMMONIA in the last 168 hours. Coagulation profile  Recent Labs Lab 08/14/15 1709  INR 2.37*    Cardiac Enzymes: No results for input(s): CKTOTAL, CKMB, CKMBINDEX, TROPONINI in the last 168 hours. BNP: Invalid input(s): POCBNP CBG:  Recent Labs Lab 08/14/15 1639 08/14/15 1846 08/14/15 1915  GLUCAP 87 107* 110*   D-Dimer No results for input(s): DDIMER in the last 72 hours. Hgb A1c No results for input(s): HGBA1C in the last 72 hours. Lipid Profile No results for input(s): CHOL, HDL, LDLCALC, TRIG, CHOLHDL, LDLDIRECT in the last 72 hours. Thyroid function studies No results for input(s): TSH, T4TOTAL, T3FREE, THYROIDAB in the last 72 hours.  Invalid input(s): FREET3 Anemia work up No results for input(s): VITAMINB12, FOLATE, FERRITIN, TIBC, IRON, RETICCTPCT in the last 72 hours. Urinalysis    Component Value Date/Time   COLORURINE YELLOW 08/14/2015 1845   APPEARANCEUR CLEAR 08/14/2015 1845   LABSPEC 1.012 08/14/2015 1845    PHURINE 5.0 08/14/2015 1845   GLUCOSEU NEGATIVE 08/14/2015 1845   HGBUR NEGATIVE 08/14/2015 1845   BILIRUBINUR NEGATIVE 08/14/2015 1845   KETONESUR NEGATIVE 08/14/2015 1845   PROTEINUR NEGATIVE 08/14/2015 1845   UROBILINOGEN 0.2 12/22/2012 1234   NITRITE NEGATIVE 08/14/2015 1845   LEUKOCYTESUR NEGATIVE 08/14/2015 1845     Microbiology No results found for this or any previous visit (from the past 240 hour(s)).     Inpatient Medications:   Scheduled Meds: Continuous Infusions:   Radiological Exams on Admission: Ct Head Wo Contrast  08/14/2015  CLINICAL DATA:  Status post fall on May 14th. Injury to back of head. Slight headache. On blood thinners. EXAM: CT HEAD WITHOUT CONTRAST TECHNIQUE: Contiguous axial images were obtained from the base of the skull through the vertex without intravenous contrast. COMPARISON:  Head CT dated 06/27/2015. FINDINGS: Brain: Generalized age related brain atrophy with commensurate dilatation of the ventricles and sulci. Mild chronic small vessel ischemic change in the deep periventricular white matter regions bilaterally. Small old lacunar infarcts within the basal ganglia. No mass, hemorrhage, edema or other evidence of acute parenchymal abnormality. No extra-axial hemorrhage. Vascular: No hyperdense vessel or unexpected calcification. There are chronic calcified atherosclerotic changes of the large vessels at the skull base. Skull: Negative for fracture or focal lesion. Sinuses/Orbits: Mucosal thickening within the right sphenoid sinus, of uncertain chronicity, most likely chronic. Other: No superficial soft tissue hematoma appreciated. IMPRESSION: 1. Negative head CT. No intracranial hemorrhage or edema. No skull fracture. 2. Right sphenoid sinus disease, of uncertain chronicity, most likely chronic. Electronically Signed   By: Bary Richard M.D.   On: 08/14/2015 17:38    Impression/Recommendations Principal Problem:   Slurred speech Active Problems:    Essential hypertension   Cardiomyopathy, ischemic- last EF 35-40% by echo May 2016   Hyperlipemia   Chronic renal insufficiency, stage IV (severe)   Chronic anticoagulation    Slurred speech - Hypoglycemia vs TIA  Symptoms resolved with resolution of hypoglycemia  Have had discussion with family about admission for stroke work up; however, at this point, it seems very unlikely that we would find anything that would change management.  Patient is  certainly high risk for stroke due to his numerous medical problems including but not limited to A.Fib, carotid stenosis which has been slightly progressive as of Jan on the left, DM, etc.  However, he is already on maximal medical therapy including eliquis for the A.Fib, statins, blood pressure control, DM2 (which given the hypoglycemia and fact that he is eating less recently is TOO aggressive in this hospice patient), etc.  Patient is certainly not a candidate for any sort of carotid endarterectomy with his baseline hospice status due to cardiorenal syndrome and [facility] class 3-4 CHF.  Likewise any stenting procedure would also not be advisable in this patient due to his CKD stage 4.  His being on eliquis (which remains recommended due to A.Fib and very high CHADS-vasc) also means he is not a candidate for TPA in the event of a stroke.  Discussed all of this with family, offered observation admission and further testing; however, family and patient have declined the admission offer and elected to go home, I feel that this is certainly reasonable for this patient who's goals of care are clearly comfort.  DM2 - needs glucometer script family will try stopping his glipizide due to hypoglycemia today, close monitoring of BGLs tomorrow, may need to go back on it with a lower dose if BGLs are getting up into the upper 200s-300s.  Thank you for this consultation.  Time Spent: 80 min  GARDNER, JARED M. D.O. [facility] 08/14/2015, 7:58 PM

## 2015-08-15 ENCOUNTER — Other Ambulatory Visit: Payer: Medicare Other

## 2015-08-15 ENCOUNTER — Telehealth: Payer: Self-pay | Admitting: Cardiovascular Disease

## 2015-08-15 NOTE — Telephone Encounter (Signed)
New message    Jane Parker the dirMelene Mullerector [place] [facility] [place] was just wanting to let the Md know about the pt Blood sugar dropping on yesterday afternoon and the wife took the pt tot ht ER department, there is a case worker with the pt at home today and everything is fine today. No need to call them back this is just a [place]

## 2015-08-23 ENCOUNTER — Encounter (HOSPITAL_COMMUNITY): Payer: Medicare Other

## 2015-08-23 ENCOUNTER — Ambulatory Visit: Payer: Medicare Other | Admitting: Cardiovascular Disease

## 2015-08-24 ENCOUNTER — Telehealth: Payer: Self-pay | Admitting: Hematology and Oncology

## 2015-08-24 ENCOUNTER — Other Ambulatory Visit: Payer: Medicare Other

## 2015-08-24 ENCOUNTER — Ambulatory Visit: Payer: Medicare Other

## 2015-08-24 NOTE — Telephone Encounter (Signed)
Received call 5/31 to cancel all upcoming appts due to patient on hospice care

## 2015-08-25 ENCOUNTER — Telehealth: Payer: Self-pay | Admitting: *Deleted

## 2015-08-25 NOTE — Telephone Encounter (Signed)
[facility] signed by Dr Allyson SabalBerry and faxed.

## 2015-08-30 ENCOUNTER — Ambulatory Visit: Payer: Medicare Other | Admitting: Cardiology

## 2015-08-30 LAB — BASIC METABOLIC PANEL
BUN: 67 mg/dL — ABNORMAL HIGH (ref 7–25)
CALCIUM: 8.4 mg/dL — AB (ref 8.6–10.3)
CO2: 25 mmol/L (ref 20–31)
Chloride: 98 mmol/L (ref 98–110)
Creat: 2.65 mg/dL — ABNORMAL HIGH (ref 0.70–1.11)
Glucose, Bld: 253 mg/dL — ABNORMAL HIGH (ref 65–99)
Potassium: 3.9 mmol/L (ref 3.5–5.3)
SODIUM: 137 mmol/L (ref 135–146)

## 2015-08-30 LAB — BRAIN NATRIURETIC PEPTIDE: BRAIN NATRIURETIC PEPTIDE: 2103.2 pg/mL — AB (ref <100)

## 2015-09-01 ENCOUNTER — Telehealth: Payer: Self-pay | Admitting: Cardiovascular Disease

## 2015-09-01 NOTE — Telephone Encounter (Signed)
Returned call to pt. He put his wife on the phone. She expressed the pt has a trip planned with his son tomorrow and would rather not come to the appt in the morning. He is in Hospice and was really looking forward to the trip. Patient has appt scheduled with Heart failure clinic on 09/08/15. Instructed her to keep that appt. Appt for tomorrow was cancelled per pt request.  Will forward to Dr Allyson SabalBerry to see if patient should be seen prior to appt on 09/08/15.

## 2015-09-01 NOTE — Telephone Encounter (Signed)
Called Heart Failure clinic to see if they had any appts sooner than 09/08/15. Scheduler said there were not any appts at this time. Message left with triage nurse to call if patient's appt can be changed.

## 2015-09-01 NOTE — Telephone Encounter (Signed)
Returned call to patient and gave the results and recommendations as follows per Dr Allyson SabalBerry. Notes Recorded by Runell GessJonathan J Berry, MD on 08/30/2015 at 4:52 PM Worsening BNP. The patient is clinically worse should probably be seen by a mid-level provider or the flexible clinic.  Appt scheduled for pt tomorrow, 09/02/15 at 10:30 a.m. [facility]. Patient verbalized understanding of the appt.

## 2015-09-01 NOTE — Telephone Encounter (Signed)
Follow-up     The pt is returning the nurses call the pt does not know what it is about.

## 2015-09-01 NOTE — Telephone Encounter (Signed)
New message        The pt is wanting to speak with Victorino DikeJennifer

## 2015-09-02 ENCOUNTER — Ambulatory Visit: Payer: Medicare Other | Admitting: Cardiology

## 2015-09-02 NOTE — Telephone Encounter (Signed)
Spoke to Dr Allyson SabalBerry about the situation. He said it was fine if pt was seen at the heart failure clinic on 09/08/15.  Appt is not needed sooner.

## 2015-09-07 ENCOUNTER — Ambulatory Visit: Payer: Medicare Other

## 2015-09-07 ENCOUNTER — Other Ambulatory Visit: Payer: Medicare Other

## 2015-09-08 ENCOUNTER — Encounter (HOSPITAL_COMMUNITY): Payer: Self-pay | Admitting: Internal Medicine

## 2015-09-08 ENCOUNTER — Encounter (HOSPITAL_COMMUNITY): Payer: Self-pay | Admitting: Nurse Practitioner

## 2015-09-08 ENCOUNTER — Ambulatory Visit (HOSPITAL_COMMUNITY)
Admission: RE | Admit: 2015-09-08 | Discharge: 2015-09-08 | Disposition: A | Discharge status: Home / Self Care | Source: Ambulatory Visit | Attending: Internal Medicine | Admitting: Internal Medicine

## 2015-09-08 ENCOUNTER — Inpatient Hospital Stay (HOSPITAL_COMMUNITY)
Admission: AD | Admit: 2015-09-08 | Discharge: 2015-09-12 | DRG: 291 | Disposition: A | Discharge status: Hospice | Source: Ambulatory Visit | Attending: Internal Medicine | Admitting: Internal Medicine

## 2015-09-08 VITALS — BP 122/60 | HR 85 | Wt 205.5 lb

## 2015-09-08 DIAGNOSIS — Z7951 Long term (current) use of inhaled steroids: Secondary | ICD-10-CM | POA: Diagnosis not present

## 2015-09-08 DIAGNOSIS — I5043 Acute on chronic combined systolic (congestive) and diastolic (congestive) heart failure: Secondary | ICD-10-CM | POA: Diagnosis present

## 2015-09-08 DIAGNOSIS — I480 Paroxysmal atrial fibrillation: Secondary | ICD-10-CM | POA: Diagnosis present

## 2015-09-08 DIAGNOSIS — Z7982 Long term (current) use of aspirin: Secondary | ICD-10-CM | POA: Diagnosis not present

## 2015-09-08 DIAGNOSIS — Z66 Do not resuscitate: Secondary | ICD-10-CM | POA: Diagnosis present

## 2015-09-08 DIAGNOSIS — Z951 Presence of aortocoronary bypass graft: Secondary | ICD-10-CM | POA: Diagnosis not present

## 2015-09-08 DIAGNOSIS — R0602 Shortness of breath: Secondary | ICD-10-CM | POA: Diagnosis present

## 2015-09-08 DIAGNOSIS — Z7984 Long term (current) use of oral hypoglycemic drugs: Secondary | ICD-10-CM | POA: Diagnosis not present

## 2015-09-08 DIAGNOSIS — Z981 Arthrodesis status: Secondary | ICD-10-CM | POA: Diagnosis not present

## 2015-09-08 DIAGNOSIS — I447 Left bundle-branch block, unspecified: Secondary | ICD-10-CM

## 2015-09-08 DIAGNOSIS — I5023 Acute on chronic systolic (congestive) heart failure: Secondary | ICD-10-CM | POA: Diagnosis not present

## 2015-09-08 DIAGNOSIS — E785 Hyperlipidemia, unspecified: Secondary | ICD-10-CM | POA: Diagnosis present

## 2015-09-08 DIAGNOSIS — I509 Heart failure, unspecified: Secondary | ICD-10-CM | POA: Diagnosis not present

## 2015-09-08 DIAGNOSIS — K219 Gastro-esophageal reflux disease without esophagitis: Secondary | ICD-10-CM | POA: Diagnosis present

## 2015-09-08 DIAGNOSIS — Z955 Presence of coronary angioplasty implant and graft: Secondary | ICD-10-CM

## 2015-09-08 DIAGNOSIS — N184 Chronic kidney disease, stage 4 (severe): Secondary | ICD-10-CM | POA: Diagnosis not present

## 2015-09-08 DIAGNOSIS — I130 Hypertensive heart and chronic kidney disease with heart failure and stage 1 through stage 4 chronic kidney disease, or unspecified chronic kidney disease: Secondary | ICD-10-CM | POA: Diagnosis present

## 2015-09-08 DIAGNOSIS — I10 Essential (primary) hypertension: Secondary | ICD-10-CM

## 2015-09-08 DIAGNOSIS — E1122 Type 2 diabetes mellitus with diabetic chronic kidney disease: Secondary | ICD-10-CM | POA: Diagnosis present

## 2015-09-08 DIAGNOSIS — I739 Peripheral vascular disease, unspecified: Secondary | ICD-10-CM | POA: Diagnosis present

## 2015-09-08 DIAGNOSIS — N179 Acute kidney failure, unspecified: Secondary | ICD-10-CM | POA: Diagnosis not present

## 2015-09-08 DIAGNOSIS — R0609 Other forms of dyspnea: Principal | ICD-10-CM

## 2015-09-08 DIAGNOSIS — Z79899 Other long term (current) drug therapy: Secondary | ICD-10-CM | POA: Diagnosis not present

## 2015-09-08 DIAGNOSIS — Z87891 Personal history of nicotine dependence: Secondary | ICD-10-CM

## 2015-09-08 DIAGNOSIS — Z794 Long term (current) use of insulin: Secondary | ICD-10-CM | POA: Diagnosis not present

## 2015-09-08 DIAGNOSIS — I2510 Atherosclerotic heart disease of native coronary artery without angina pectoris: Secondary | ICD-10-CM | POA: Diagnosis present

## 2015-09-08 DIAGNOSIS — Z888 Allergy status to other drugs, medicaments and biological substances status: Secondary | ICD-10-CM | POA: Diagnosis not present

## 2015-09-08 DIAGNOSIS — Z7901 Long term (current) use of anticoagulants: Secondary | ICD-10-CM | POA: Diagnosis not present

## 2015-09-08 DIAGNOSIS — Z515 Encounter for palliative care: Secondary | ICD-10-CM | POA: Diagnosis present

## 2015-09-08 DIAGNOSIS — I252 Old myocardial infarction: Secondary | ICD-10-CM | POA: Diagnosis not present

## 2015-09-08 DIAGNOSIS — I429 Cardiomyopathy, unspecified: Secondary | ICD-10-CM

## 2015-09-08 DIAGNOSIS — I2583 Coronary atherosclerosis due to lipid rich plaque: Secondary | ICD-10-CM

## 2015-09-08 DIAGNOSIS — I482 Chronic atrial fibrillation: Secondary | ICD-10-CM | POA: Diagnosis not present

## 2015-09-08 LAB — COMPREHENSIVE METABOLIC PANEL
ALT: 9 U/L — AB (ref 17–63)
AST: 17 U/L (ref 15–41)
Albumin: 3.4 g/dL — ABNORMAL LOW (ref 3.5–5.0)
Alkaline Phosphatase: 65 U/L (ref 38–126)
Anion gap: 13 (ref 5–15)
BILIRUBIN TOTAL: 1.2 mg/dL (ref 0.3–1.2)
BUN: 67 mg/dL — AB (ref 6–20)
CO2: 24 mmol/L (ref 22–32)
CREATININE: 3.01 mg/dL — AB (ref 0.61–1.24)
Calcium: 9 mg/dL (ref 8.9–10.3)
Chloride: 100 mmol/L — ABNORMAL LOW (ref 101–111)
GFR calc Af Amer: 21 mL/min — ABNORMAL LOW (ref >60)
GFR, EST NON AFRICAN AMERICAN: 18 mL/min — AB (ref >60)
Glucose, Bld: 172 mg/dL — ABNORMAL HIGH (ref 65–99)
Potassium: 4 mmol/L (ref 3.5–5.1)
Sodium: 137 mmol/L (ref 135–145)
TOTAL PROTEIN: 6.3 g/dL — AB (ref 6.5–8.1)

## 2015-09-08 LAB — TSH: TSH: 1.541 u[IU]/mL (ref 0.350–4.500)

## 2015-09-08 LAB — PROTIME-INR
INR: 2.49 — ABNORMAL HIGH (ref 0.00–1.49)
PROTHROMBIN TIME: 26.6 s — AB (ref 11.6–15.2)

## 2015-09-08 LAB — CBC
HCT: 31.6 % — ABNORMAL LOW (ref 39.0–52.0)
Hemoglobin: 9.8 g/dL — ABNORMAL LOW (ref 13.0–17.0)
MCH: 30.8 pg (ref 26.0–34.0)
MCHC: 31 g/dL (ref 30.0–36.0)
MCV: 99.4 fL (ref 78.0–100.0)
PLATELETS: 125 10*3/uL — AB (ref 150–400)
RBC: 3.18 MIL/uL — ABNORMAL LOW (ref 4.22–5.81)
RDW: 15.6 % — AB (ref 11.5–15.5)
WBC: 4.1 10*3/uL (ref 4.0–10.5)

## 2015-09-08 LAB — MAGNESIUM: MAGNESIUM: 2.3 mg/dL (ref 1.7–2.4)

## 2015-09-08 LAB — BRAIN NATRIURETIC PEPTIDE: B Natriuretic Peptide: 2249.8 pg/mL — ABNORMAL HIGH (ref 0.0–100.0)

## 2015-09-08 MED ORDER — ACETAMINOPHEN 325 MG PO TABS: 650.0000 mg | ORAL_TABLET | Freq: Four times a day (QID) | ORAL | Status: DC | PRN

## 2015-09-08 MED ORDER — NITROGLYCERIN 0.4 MG SL SUBL
0.4000 mg | SUBLINGUAL_TABLET | SUBLINGUAL | Status: DC | PRN
Start: 2015-09-08 — End: 2015-09-12

## 2015-09-08 MED ORDER — MOMETASONE FURO-FORMOTEROL FUM 200-5 MCG/ACT IN AERO
2.0000 | INHALATION_SPRAY | Freq: Two times a day (BID) | RESPIRATORY_TRACT | Status: DC
  Administered 2015-09-09 – 2015-09-12 (×6): 2 via RESPIRATORY_TRACT
  Filled 2015-09-08: qty 8.8

## 2015-09-08 MED ORDER — DIPHENHYDRAMINE HCL 25 MG PO CAPS
25.0000 mg | ORAL_CAPSULE | Freq: Every day | ORAL | Status: DC
  Administered 2015-09-09 – 2015-09-11 (×3): 25 mg via ORAL
  Filled 2015-09-08 (×4): qty 1

## 2015-09-08 MED ORDER — SODIUM CHLORIDE 0.9% FLUSH: 3.0000 mL | INTRAVENOUS | Status: DC | PRN

## 2015-09-08 MED ORDER — ALBUTEROL SULFATE (2.5 MG/3ML) 0.083% IN NEBU: 2.5000 mg | INHALATION_SOLUTION | Freq: Four times a day (QID) | RESPIRATORY_TRACT | Status: DC | PRN

## 2015-09-08 MED ORDER — APIXABAN 2.5 MG PO TABS
2.5000 mg | ORAL_TABLET | Freq: Two times a day (BID) | ORAL | Status: DC
  Administered 2015-09-08 – 2015-09-12 (×8): 2.5 mg via ORAL
  Filled 2015-09-08 (×8): qty 1

## 2015-09-08 MED ORDER — FUROSEMIDE 10 MG/ML IJ SOLN
80.0000 mg | Freq: Two times a day (BID) | INTRAMUSCULAR | Status: DC
  Administered 2015-09-08 – 2015-09-12 (×8): 80 mg via INTRAVENOUS
  Filled 2015-09-08 (×8): qty 8

## 2015-09-08 MED ORDER — VITAMIN D 1000 UNITS PO TABS
1000.0000 [IU] | ORAL_TABLET | Freq: Every day | ORAL | Status: DC
  Administered 2015-09-09 – 2015-09-12 (×4): 1000 [IU] via ORAL
  Filled 2015-09-08 (×4): qty 1

## 2015-09-08 MED ORDER — PRAVASTATIN SODIUM 40 MG PO TABS
40.0000 mg | ORAL_TABLET | Freq: Every day | ORAL | Status: DC
  Administered 2015-09-08 – 2015-09-11 (×4): 40 mg via ORAL
  Filled 2015-09-08 (×4): qty 1

## 2015-09-08 MED ORDER — AMLODIPINE BESYLATE 5 MG PO TABS
5.0000 mg | ORAL_TABLET | Freq: Every day | ORAL | Status: DC
  Administered 2015-09-09: 5 mg via ORAL
  Filled 2015-09-08: qty 1

## 2015-09-08 MED ORDER — CETYLPYRIDINIUM CHLORIDE 0.05 % MT LIQD
7.0000 mL | Freq: Two times a day (BID) | OROMUCOSAL | Status: DC
  Administered 2015-09-09 – 2015-09-12 (×6): 7 mL via OROMUCOSAL

## 2015-09-08 MED ORDER — FUROSEMIDE 10 MG/ML IJ SOLN: 80.0000 mg | Freq: Once | INTRAMUSCULAR | Status: DC

## 2015-09-08 MED ORDER — TEMAZEPAM 15 MG PO CAPS
30.0000 mg | ORAL_CAPSULE | Freq: Every evening | ORAL | Status: DC | PRN
  Administered 2015-09-08 – 2015-09-11 (×4): 30 mg via ORAL
  Filled 2015-09-08 (×3): qty 2

## 2015-09-08 MED ORDER — TEMAZEPAM 15 MG PO CAPS
15.0000 mg | ORAL_CAPSULE | Freq: Every day | ORAL | Status: DC
  Filled 2015-09-08: qty 1

## 2015-09-08 MED ORDER — ISOSORBIDE MONONITRATE ER 60 MG PO TB24
60.0000 mg | ORAL_TABLET | Freq: Every day | ORAL | Status: DC
  Administered 2015-09-09 – 2015-09-12 (×4): 60 mg via ORAL
  Filled 2015-09-08 (×4): qty 1

## 2015-09-08 MED ORDER — HYDRALAZINE HCL 50 MG PO TABS
50.0000 mg | ORAL_TABLET | Freq: Two times a day (BID) | ORAL | Status: DC
  Administered 2015-09-08 – 2015-09-12 (×8): 50 mg via ORAL
  Filled 2015-09-08 (×8): qty 1

## 2015-09-08 MED ORDER — SODIUM CHLORIDE 0.9 % IV SOLN: 250.0000 mL | INTRAVENOUS | Status: DC | PRN

## 2015-09-08 MED ORDER — ASPIRIN EC 81 MG PO TBEC
81.0000 mg | DELAYED_RELEASE_TABLET | Freq: Every day | ORAL | Status: DC
  Administered 2015-09-08 – 2015-09-11 (×4): 81 mg via ORAL
  Filled 2015-09-08 (×8): qty 1

## 2015-09-08 MED ORDER — FUROSEMIDE 10 MG/ML IJ SOLN
80.0000 mg | Freq: Once | INTRAMUSCULAR | Status: AC
  Administered 2015-09-08: 80 mg via INTRAVENOUS
  Filled 2015-09-08: qty 8

## 2015-09-08 MED ORDER — SODIUM CHLORIDE 0.9% FLUSH
3.0000 mL | Freq: Two times a day (BID) | INTRAVENOUS | Status: DC
  Administered 2015-09-08 – 2015-09-12 (×9): 3 mL via INTRAVENOUS

## 2015-09-08 MED ORDER — ONDANSETRON HCL 4 MG/2ML IJ SOLN: 4.0000 mg | Freq: Four times a day (QID) | INTRAMUSCULAR | Status: DC | PRN

## 2015-09-08 MED ORDER — CARVEDILOL 25 MG PO TABS
25.0000 mg | ORAL_TABLET | Freq: Two times a day (BID) | ORAL | Status: DC
  Administered 2015-09-08 – 2015-09-09 (×3): 25 mg via ORAL
  Filled 2015-09-08 (×3): qty 1

## 2015-09-08 NOTE — Addendum Note (Signed)
Encounter addended by: Chyrl CivatteMegan G Kaislee Chao, RN on: 09/08/2015  1:14 PM<BR>     Documentation filed: Visit Diagnoses, Dx Association, Orders

## 2015-09-08 NOTE — Progress Notes (Signed)
[facility]- [id number]  [facility] Visit This is a GIP related and covered hospital admission of 09/08/15 per Dr. Barbee ShropshireHertweck with a [facility] dx of CHF.   Code status is DNR.   Patient is admitted with acute on chronic combined CHF with marked volume overload.   Visited patient at bedside. Wife was also present.  Patient reports feeling "bloated" and that he was sent here directly from the heart failure clinic today  due to fluid overload.  Patient reports that he has 40lbs of fluid that needs to be removed.  Patient reports increased dyspnea with minimal exertion and increased cough as well as lower extremity edema . Per chart review his weight is up 5lbs just since yesterday.   He is alert, oriented and pleasant. He is currently eating his lunch.  He did report some mild discomfort related to having a foley placed.  Plan is to aggressively diurese him with IV lasix and monitor closely.   He has received one dose of IV lasix 80 mg today and will be receiving lasix 80 mg IV BID.      [facility] will continue to follow patient until discharge and assist with any discharge planning.  Please call with any questions.   Lavone NeriSusan Collier Monica, RN  [facility]  [phone]

## 2015-09-08 NOTE — Progress Notes (Signed)
Patient ID: [name], male   DOB: 11/01/1932, 82 y.o.   MRN: [id number]    Advanced Heart Failure Clinic Note   Referring Physician: Quay Burow, MD Primary Care: [name] Frees, MD Primary Cardiologist: [name] Burow, MD  HPI:  [name] is a 82 y.o. male with complicated medical history including CAD with CABG 4315, Chronic systolic HF Echo 4/00/86 LVEF 30-35%, Paroxysmal Atrial fibrillation on renally adjusted Eliquis, HLD who has been referred to the HF clinic for further evaluation.   He presents today for additional management of his HF meds. Of note, his weight has increased 15 lbs since his appointment with Dr [name] on 08/05/15 despite adjustment of his diuretics. Of note, patient was recently made a Hospice patient. (End of 07/2015). Weight at home up 5 lbs yesterday.  He thinks his baseline weight is closer to 180, and weight today 200 (home scale).  Has been feeling bad overall since he fell and hit head in March of this year.   He has SOB with ADLs such as changing clothes and bathing.  He has cough with lying flat and significant bendopnea.  Legs are full and heavy to the point he has to sit down to urinate.   He states he is miserable at home, and doesn't feel like he's peeing as much on lasix. Urine is darker but looks "normal" to him with his CKD.   No bleeding on eliquis and ASA.   Echo 05/18/15 LVEF 30-35%, Grade 3 DD, RV moderately dilated  Review of Systems: [y] = yes, '[ ]'$  = no   General: Weight gain [y]; Weight loss '[ ]'$ ; Anorexia '[ ]'$ ; Fatigue '[ ]'$ ; Fever '[ ]'$ ; Chills '[ ]'$ ; Weakness [y]  Cardiac: Chest pain/pressure '[ ]'$ ; Resting SOB '[ ]'$ ; Exertional SOB [y]; Orthopnea [y]; Pedal Edema [y]; Palpitations '[ ]'$ ; Syncope '[ ]'$ ; Presyncope '[ ]'$ ; Paroxysmal nocturnal dyspnea'[ ]'$   Pulmonary: Cough [y]; Wheezing'[ ]'$ ; Hemoptysis'[ ]'$ ; Sputum '[ ]'$ ; Snoring '[ ]'$   GI: Vomiting'[ ]'$ ; Dysphagia'[ ]'$ ; Melena'[ ]'$ ; Hematochezia '[ ]'$ ; Heartburn'[ ]'$ ; Abdominal pain '[ ]'$ ; Constipation '[ ]'$ ;  Diarrhea '[ ]'$ ; BRBPR '[ ]'$   GU: Hematuria'[ ]'$ ; Dysuria '[ ]'$ ; Nocturia'[ ]'$   Vascular: Pain in legs with walking '[ ]'$ ; Pain in feet with lying flat '[ ]'$ ; Non-healing sores '[ ]'$ ; Stroke '[ ]'$ ; TIA '[ ]'$ ; Slurred speech '[ ]'$ ;  Neuro: Headaches'[ ]'$ ; Vertigo'[ ]'$ ; Seizures'[ ]'$ ; Paresthesias'[ ]'$ ;Blurred vision '[ ]'$ ; Diplopia '[ ]'$ ; Vision changes '[ ]'$   Ortho/Skin: Arthritis [y]; Joint pain [y]; Muscle pain '[ ]'$ ; Joint swelling '[ ]'$ ; Back Pain '[ ]'$ ; Rash '[ ]'$   Psych: Depression'[ ]'$ ; Anxiety'[ ]'$   Heme: Bleeding problems '[ ]'$ ; Clotting disorders '[ ]'$ ; Anemia '[ ]'$   Endocrine: Diabetes [y]; Thyroid dysfunction'[ ]'$    Past Medical History  Diagnosis Date  . Angina at rest [facility]) 02/27/2011  . CAD (coronary artery disease) 02/27/2011    Catheterized on 02/28/11, graft supplying marginal vessel was occluded, after 48 hours of hydration, a repeat cath was performed in an attempt at PCI 03/02/11 cath had an unsuccessful attempt at PCI to an occluded calcified circumflex vessel, Residual distal tip of the ChoICE PT moderate support wire at the subtotal occlusion. No change in the subtotal occlusion,improved retrograde collateralization  . LV dysfunction 02/27/2011  . CKD (chronic kidney disease) stage 3, GFR 30-59 ml/min 02/27/2011  . LBBB (left bundle branch block) 02/27/2011  . Hypertension   . Hyperlipemia 02/27/2011  . Diabetes mellitus 02/27/2011  insulin dependent  . GERD (gastroesophageal reflux disease)   . CHF (congestive heart failure) ([place])   . Blood transfusion     no reaction to transfusion per patient  . Arthritis   . Myocardial infarction ([place])   . PVD (peripheral vascular disease) ([place])     Carotid Angiography was performed in 2007, no treatment delivered at that time. Right carotid endarterectomy in 2009, being followed by duplex ultrasound. Doppler remains stable.  [name] CAD (coronary artery disease)     PTCA of the right coronary artery in Feb. 1987, July 1987, and June 1990. Circumflex PTCA in July 1993, followed by a RCA, PTCA  in Sept. 1994. CABG in 1994x5 with a LIMA to the LAD, vein graft to the first diagonal, vein graft to the marginal and left circumflex coronary artery, and sequential graft to the PDA and PLA branches of the RCA  . HTN (hypertension)     Renal dopplers have been performed on a semi annual basis. Left renal artery demonstrated vessel narrowing with elevated velocities consistent with a 1-59% diameter reduction.   . carotid doppler     Left ECA : The proximal most aspect of the left external carotid artery demonstrates a peak systolic velocity of 812.7 cm/s. This is consistent with a 70-99% significant stenosis correlated with a 2007 angiogram.  . Paroxysmal atrial fibrillation ([place])   . Deficiency anemia 08/05/2015    Current Outpatient Prescriptions  Medication Sig Dispense Refill  . acetaminophen (TYLENOL) 325 MG tablet Take 650 mg by mouth every 6 (six) hours as needed for pain.    [name] albuterol (PROVENTIL HFA;VENTOLIN HFA) 108 (90 Base) MCG/ACT inhaler Inhale 2 puffs into the lungs every 6 (six) hours as needed for wheezing or shortness of breath. 1 Inhaler 6  . amLODipine (NORVASC) 5 MG tablet Take 1 tablet (5 mg total) by mouth daily. 90 tablet 3  . apixaban (ELIQUIS) 2.5 MG TABS tablet Take 2 tablets (5 mg total) by mouth 2 (two) times daily. 180 tablet 3  . aspirin 81 MG tablet Take 81 mg by mouth at bedtime.     . budesonide-formoterol (SYMBICORT) 160-4.5 MCG/ACT inhaler Inhale 2 puffs into the lungs 2 (two) times daily. 1 Inhaler 6  . carvedilol (COREG) 25 MG tablet TAKE 1&1/2 TABLETS BY MOUTH 2 TIMES DAILY WITH MEALS 270 tablet 3  . cholecalciferol (VITAMIN D) 1000 UNITS tablet Take 1,000 Units by mouth daily.    . diphenhydrAMINE (BENADRYL) 25 MG tablet Take 25 mg by mouth daily.    . furosemide (LASIX) 40 MG tablet Take 2 tablets (80 mg total) by mouth 2 (two) times daily. 180 tablet 3  . glipiZIDE (GLUCOTROL) 10 MG tablet Take 20 mg by mouth 2 (two) times daily.     [name] glucose  monitoring kit (FREESTYLE) monitoring kit 1 each by Does not apply route as needed for other. 1 each 0  . hydrALAZINE (APRESOLINE) 50 MG tablet Take 1 tablet (50 mg total) by mouth 2 (two) times daily. 180 tablet 1  . isosorbide mononitrate (IMDUR) 60 MG 24 hr tablet Take 60 mg by mouth daily.    . pravastatin (PRAVACHOL) 40 MG tablet Take 40 mg by mouth at bedtime.      . temazepam (RESTORIL) 15 MG capsule Take 15 mg by mouth at bedtime.    . nitroGLYCERIN (NITROSTAT) 0.4 MG SL tablet Place 0.4 mg under the tongue every 5 (five) minutes as needed for chest pain. Reported on 09/08/2015  No current facility-administered medications for this encounter.    Allergies  Allergen Reactions  . Lisinopril Cough      Social History   Social History  . Marital Status: Married    Spouse Name: N/A  . Number of Children: N/A  . Years of Education: N/A   Occupational History  . Not on file.   Social History Main Topics  . Smoking status: Former Smoker -- 22 years    Quit date: 03/26/1958  . Smokeless tobacco: Never Used     Comment: quit smoking in the 1960's  . Alcohol Use: No  . Drug Use: No  . Sexual Activity: Yes   Other Topics Concern  . Not on file   Social History Narrative      Family History  Problem Relation Age of Onset  . Cancer Mother     unknown to pt  . Cancer Father     pancreas  . Cancer Paternal Grandfather     prostate    Filed Vitals:   09/08/15 1124  BP: 122/60  Pulse: 85  Weight: 205 lb 8 oz (93.214 kg)  SpO2: 97%   Wt Readings from Last 3 Encounters:  09/08/15 205 lb 8 oz (93.214 kg)  08/14/15 190 lb (86.183 kg)  08/05/15 190 lb 2 oz (86.24 kg)     PHYSICAL EXAM: General:  Elderly appearing. NAD on 02 via [place].  HEENT: Normal, 02 in place Neck: supple. JVD to ear. Carotids 2+ bilat; no bruits. No thyromegaly or nodule noted.  Cor: PMI nondisplaced. Irregularly irregular. No rubs, gallops or murmurs. Lungs: Diminished with bibasilar  crackles Abdomen: soft, NT, Mild/mod distention, no HSM. No bruits or masses. +BS  Extremities: no cyanosis, clubbing, rash, 3 + edema into thigh Neuro: alert & oriented x 3, cranial nerves grossly intact. moves all 4 extremities w/o difficulty. Affect pleasant.  ECG: Afib 91 bpm, LBBB   ASSESSMENT & PLAN:  1. Acute on chronic combined HF : LVEF 30-35% with Grade 3 Diastolic dysfunction - He is markedly volume overloaded on exam with 3+ edema and NYHA class IIIb symptoms. - Discussed with patient and options are admission or continue outpatient management.  We all agree that hospice status is appropriate. Pt re-confirms that he would not want to be resuscitated or intubated.  - Will not pursue aggressive means such as PICC line or RHC at this time.  He is a poor candidate for advanced therapies with his advanced age and co-morbidities.  2. PAF - On dose adjusted Eliquis 3. CKD stage IV - Will likely be limiting factor. Suspect that aggressive diuresis may worsen this and shorten his already poor prognosis, but is likely the only thing that will make him more comfortable.  - Check BMET on admission.  4. CAD s/p CABG 1994 - No CP currently.  On ASA and eliquis 5. HTN - Will manage in setting of managing his HF.   Pt is markedly volume overload and agrees to admission.  He understands that this is most likely a palliative measure at this point with his degree of disease as well as his CKD.   He and wife are aware that if he declines in hospital or if CKD worsens, the best option will likely be to proceed to full comfort care vs residential hospice.   Shirley Friar, PA-C 09/08/2015

## 2015-09-08 NOTE — H&P (Signed)
Advanced Heart Failure Team History and Physical Note   Referring Physician: Quay Burow, MD Primary Care: Shirline Frees, MD Primary Cardiologist: Quay Burow, MD HF: New (Dr. Haroldine Laws)  Reason for Admission: Acute on Chronic Combined CHF with marked volume overload.    HPI:    [name] is a 82 y.o. male with a complicated medical history including CAD with CABG 2353, Chronic systolic HF Echo 09/06/41 LVEF 30-35%, Paroxysmal Atrial fibrillation on renally adjusted Eliquis, HLD who has been referred to the HF clinic for further evaluation.   He presented today to establish with the HF clinic for additional management of his HF meds. Of note, his weight has increased 15 lbs since his appointment with Dr Gwenlyn Found on 08/05/15 despite adjustment of his diuretics and patient was also recently made a Hospice patient. (End of 07/2015). Weight at home up 5 lbs from yesterday. He thinks his baseline weight is closer to 180, and weight today 200 (home scale). Has been feeling bad overall since he fell and hit head in March of this year. He has SOB with ADLs such as changing clothes and bathing. He has cough with lying flat and significant bendopnea. Legs are full and heavy to the point he has to sit down to urinate. He states he is miserable at home, and doesn't feel like he's peeing as much on lasix. Urine is darker but looks "normal" to him with his CKD. No bleeding on eliquis and ASA.   Echo 05/18/15 LVEF 30-35%, Grade 3 DD, RV moderately dilated  Review of Systems: [y] = yes, '[ ]'$  = no    General: Weight gain [y]; Weight loss '[ ]'$ ; Anorexia '[ ]'$ ; Fatigue '[ ]'$ ; Fever '[ ]'$ ; Chills '[ ]'$ ; Weakness [y]   Cardiac: Chest pain/pressure '[ ]'$ ; Resting SOB '[ ]'$ ; Exertional SOB [y]; Orthopnea [y]; Pedal Edema [y]; Palpitations '[ ]'$ ; Syncope '[ ]'$ ; Presyncope '[ ]'$ ; Paroxysmal nocturnal dyspnea'[ ]'$    Pulmonary: Cough [y]; Wheezing'[ ]'$ ; Hemoptysis'[ ]'$ ; Sputum '[ ]'$ ; Snoring '[ ]'$    GI: Vomiting'[ ]'$ ;  Dysphagia'[ ]'$ ; Melena'[ ]'$ ; Hematochezia '[ ]'$ ; Heartburn'[ ]'$ ; Abdominal pain '[ ]'$ ; Constipation '[ ]'$ ; Diarrhea '[ ]'$ ; BRBPR '[ ]'$    GU: Hematuria'[ ]'$ ; Dysuria '[ ]'$ ; Nocturia'[ ]'$   Vascular: Pain in legs with walking '[ ]'$ ; Pain in feet with lying flat '[ ]'$ ; Non-healing sores '[ ]'$ ; Stroke '[ ]'$ ; TIA '[ ]'$ ; Slurred speech '[ ]'$ ;   Neuro: Headaches'[ ]'$ ; Vertigo'[ ]'$ ; Seizures'[ ]'$ ; Paresthesias'[ ]'$ ;Blurred vision '[ ]'$ ; Diplopia '[ ]'$ ; Vision changes '[ ]'$    Ortho/Skin: Arthritis [y]; Joint pain [y]; Muscle pain '[ ]'$ ; Joint swelling '[ ]'$ ; Back Pain '[ ]'$ ; Rash '[ ]'$    Psych: Depression'[ ]'$ ; Anxiety'[ ]'$    Heme: Bleeding problems '[ ]'$ ; Clotting disorders '[ ]'$ ; Anemia '[ ]'$   Endocrine: Diabetes [y]; Thyroid dysfunction'[ ]'$   Home Medications Prior to Admission medications   Medication Sig Start Date End Date Taking? Authorizing Provider  acetaminophen (TYLENOL) 325 MG tablet Take 650 mg by mouth every 6 (six) hours as needed for pain.    Historical Provider, MD  albuterol (PROVENTIL HFA;VENTOLIN HFA) 108 (90 Base) MCG/ACT inhaler Inhale 2 puffs into the lungs every 6 (six) hours as needed for wheezing or shortness of breath. 06/16/15   [place], MD  amLODipine (NORVASC) 5 MG tablet Take 1 tablet (5 mg total) by mouth daily. 08/05/15   Lorretta Harp, MD  apixaban (ELIQUIS) 2.5 MG TABS tablet Take 2  tablets (5 mg total) by mouth 2 (two) times daily. 01/20/15   Lorretta Harp, MD  aspirin 81 MG tablet Take 81 mg by mouth at bedtime.     Historical Provider, MD  budesonide-formoterol (SYMBICORT) 160-4.5 MCG/ACT inhaler Inhale 2 puffs into the lungs 2 (two) times daily. 05/04/15   [place], MD  carvedilol (COREG) 25 MG tablet TAKE 1&1/2 TABLETS BY MOUTH 2 TIMES DAILY WITH MEALS 01/21/15   Lorretta Harp, MD  cholecalciferol (VITAMIN D) 1000 UNITS tablet Take 1,000 Units by mouth daily.    Historical Provider, MD  diphenhydrAMINE (BENADRYL) 25 MG tablet Take 25 mg by mouth daily.    Historical Provider, MD    furosemide (LASIX) 40 MG tablet Take 2 tablets (80 mg total) by mouth 2 (two) times daily. 08/05/15   Lorretta Harp, MD  glipiZIDE (GLUCOTROL) 10 MG tablet Take 20 mg by mouth 2 (two) times daily.     Historical Provider, MD  glucose monitoring kit (FREESTYLE) monitoring kit 1 each by Does not apply route as needed for other. 08/14/15   Wandra Arthurs, MD  hydrALAZINE (APRESOLINE) 50 MG tablet Take 1 tablet (50 mg total) by mouth 2 (two) times daily. 11/20/13   Lorretta Harp, MD  isosorbide mononitrate (IMDUR) 60 MG 24 hr tablet Take 60 mg by mouth daily.    Historical Provider, MD  nitroGLYCERIN (NITROSTAT) 0.4 MG SL tablet Place 0.4 mg under the tongue every 5 (five) minutes as needed for chest pain. Reported on 09/08/2015    Historical Provider, MD  pravastatin (PRAVACHOL) 40 MG tablet Take 40 mg by mouth at bedtime.      Historical Provider, MD  temazepam (RESTORIL) 15 MG capsule Take 15 mg by mouth at bedtime.    Historical Provider, MD    Past Medical History: Past Medical History  Diagnosis Date  . Angina at rest [facility]) 02/27/2011  . CAD (coronary artery disease) 02/27/2011    Catheterized on 02/28/11, graft supplying marginal vessel was occluded, after 48 hours of hydration, a repeat cath was performed in an attempt at PCI 03/02/11 cath had an unsuccessful attempt at PCI to an occluded calcified circumflex vessel, Residual distal tip of the ChoICE PT moderate support wire at the subtotal occlusion. No change in the subtotal occlusion,improved retrograde collateralization  . LV dysfunction 02/27/2011  . CKD (chronic kidney disease) stage 3, GFR 30-59 ml/min 02/27/2011  . LBBB (left bundle branch block) 02/27/2011  . Hypertension   . Hyperlipemia 02/27/2011  . Diabetes mellitus 02/27/2011    insulin dependent  . GERD (gastroesophageal reflux disease)   . CHF (congestive heart failure) ([place])   . Blood transfusion     no reaction to transfusion per patient  . Arthritis   . Myocardial  infarction ([place])   . PVD (peripheral vascular disease) ([place])     Carotid Angiography was performed in 2007, no treatment delivered at that time. Right carotid endarterectomy in 2009, being followed by duplex ultrasound. Doppler remains stable.  [name] CAD (coronary artery disease)     PTCA of the right coronary artery in Feb. 1987, July 1987, and June 1990. Circumflex PTCA in July 1993, followed by a RCA, PTCA in Sept. 1994. CABG in 1994x5 with a LIMA to the LAD, vein graft to the first diagonal, vein graft to the marginal and left circumflex coronary artery, and sequential graft to the PDA and PLA branches of the RCA  . HTN (hypertension)  Renal dopplers have been performed on a semi annual basis. Left renal artery demonstrated vessel narrowing with elevated velocities consistent with a 1-59% diameter reduction.   . carotid doppler     Left ECA : The proximal most aspect of the left external carotid artery demonstrates a peak systolic velocity of 646.8 cm/s. This is consistent with a 70-99% significant stenosis correlated with a 2007 angiogram.  . Paroxysmal atrial fibrillation ([place])   . Deficiency anemia 08/05/2015    Past Surgical History: Past Surgical History  Procedure Laterality Date  . Coronary artery bypass graft  1994  . Spinal fusion    . Back surgery    . Carotid endarterectomy  2009  . Myoview scan  03/30/2011  . Left heart catheterization with coronary/graft angiogram N/A 02/28/2011    Procedure: LEFT HEART CATHETERIZATION WITH Beatrix Fetters;  Surgeon: Troy Sine, MD;  Location: [facility] CATH LAB;  Service: Cardiovascular;  Laterality: N/A;  . Percutaneous coronary stent intervention (pci-s) N/A 03/02/2011    Procedure: PERCUTANEOUS CORONARY STENT INTERVENTION (PCI-S);  Surgeon: Troy Sine, MD;  Location: [facility] CATH LAB;  Service: Cardiovascular;  Laterality: N/A;  . Esophagogastroduodenoscopy (egd) with propofol N/A 06/30/2015    Procedure: ESOPHAGOGASTRODUODENOSCOPY (EGD)  WITH PROPOFOL;  Surgeon: Laurence Spates, MD;  Location: [facility] ENDOSCOPY;  Service: Endoscopy;  Laterality: N/A;     Family History  Problem Relation Age of Onset  . Cancer Mother     unknown to pt  . Cancer Father     pancreas  . Cancer Paternal Grandfather     prostate    Social History   Social History  . Marital Status: Married    Spouse Name: N/A  . Number of Children: N/A  . Years of Education: N/A   Social History Main Topics  . Smoking status: Former Smoker -- 22 years    Quit date: 03/26/1958  . Smokeless tobacco: Never Used     Comment: quit smoking in the 1960's  . Alcohol Use: No  . Drug Use: No  . Sexual Activity: Yes   Other Topics Concern  . Not on file   Social History Narrative    Allergies:  Allergies  Allergen Reactions  . Lisinopril Cough    Objective:    Vital Signs:       There were no vitals filed for this visit.  Physical Exam: General: Elderly appearing. NAD on 02 via [place].  HEENT: Normal, 02 in place Neck: supple. JVD to ear. Carotids 2+ bilat; no bruits. No thyromegaly or nodule noted.  Cor: PMI nondisplaced. Irregularly irregular. No rubs, gallops or murmurs. Lungs: Diminished with bibasilar crackles Abdomen: soft, NT, Mild/mod distention, no HSM. No bruits or masses. +BS  Extremities: no cyanosis, clubbing, rash, 3 + edema into thigh Neuro: alert & oriented x 3, cranial nerves grossly intact. moves all 4 extremities w/o difficulty. Affect pleasant.  Labs: Basic Metabolic Panel: No results for input(s): NA, K, CL, CO2, GLUCOSE, BUN, CREATININE, CALCIUM, MG, PHOS in the last 168 hours.  Liver Function Tests: No results for input(s): AST, ALT, ALKPHOS, BILITOT, PROT, ALBUMIN in the last 168 hours. No results for input(s): LIPASE, AMYLASE in the last 168 hours. No results for input(s): AMMONIA in the last 168 hours.  CBC:  Recent Labs Lab 09/08/15 1345  WBC 4.1  HGB 9.8*  HCT 31.6*  MCV 99.4  PLT 125*    Cardiac  Enzymes: No results for input(s): CKTOTAL, CKMB, CKMBINDEX, TROPONINI in the last 168  hours.  BNP: BNP (last 3 results)  Recent Labs  05/03/15 1041 05/18/15 1112 08/29/15 1315  BNP 991.9* 623.5* 2103.2*    ProBNP (last 3 results) No results for input(s): PROBNP in the last 8760 hours.   CBG: No results for input(s): GLUCAP in the last 168 hours.  Coagulation Studies: No results for input(s): LABPROT, INR in the last 72 hours.  Other results: EKG: Afib 91 bpm, LBBB  Imaging:  No results found.   Assessment/Plan   1. Acute on chronic combined HF : LVEF 30-35% with Grade 3 Diastolic dysfunction - He is markedly volume overloaded on exam with 3+ edema and NYHA class IIIb symptoms. - Discussed with patient and options are admission or continue outpatient management. We all agree that hospice status is appropriate. Pt re-confirms that he would not want to be resuscitated or intubated.  -  He is a poor candidate for advanced therapies with his advanced age and co-morbidities.  2. PAF - On dose adjusted Eliquis 3. CKD stage IV - Will likely be limiting factor. Suspect that aggressive diuresis may worsen this and shorten his already poor prognosis, but is likely the only thing that will make him more comfortable.  - Check BMET on admission.  4. CAD s/p CABG 1994 - No CP currently. On ASA and eliquis 5. HTN - Will manage in setting of managing his HF.   Pt is markedly volume overload and agrees to admission. He understands that this is most likely a palliative measure at this point with his degree of disease as well as his CKD. He and wife are aware that if he declines in hospital or if CKD worsens, the best option will likely be to proceed to full comfort care vs residential hospice.   Will place foley catheter for aggressive diuresis.   Length of Stay:    Annamaria Helling 09/08/2015, 2:12 PM  Advanced Heart Failure Team Pager [phone] (M-F; 7a -  4p)  Please contact [facility] for night-coverage after hours (4p -7a ) and weekends on [email or web address]  Patient seen and examined with Oda Kilts, PA-C. We discussed all aspects of the encounter. I agree with the assessment and plan as stated above.   He is markedly volume overload with NYHA IIIB-IV symptoms R>L.. Will admit for IV diuresis. Can place PICC as needed. Renal function marginal. If not responding to IV diuresis may need PICC line for co-ox and CVPs. Continue Eliquis for AF.   Aries Kasa,MD 3:28 PM

## 2015-09-08 NOTE — Addendum Note (Signed)
Encounter addended by: Chyrl CivatteMegan G Bradley, RN on: 09/08/2015  1:32 PM<BR>     Documentation filed: [employer], Orders

## 2015-09-09 ENCOUNTER — Inpatient Hospital Stay (HOSPITAL_COMMUNITY)

## 2015-09-09 DIAGNOSIS — I509 Heart failure, unspecified: Secondary | ICD-10-CM

## 2015-09-09 DIAGNOSIS — N179 Acute kidney failure, unspecified: Secondary | ICD-10-CM

## 2015-09-09 DIAGNOSIS — I482 Chronic atrial fibrillation: Secondary | ICD-10-CM

## 2015-09-09 LAB — BASIC METABOLIC PANEL
ANION GAP: 8 (ref 5–15)
BUN: 66 mg/dL — ABNORMAL HIGH (ref 6–20)
CALCIUM: 8.7 mg/dL — AB (ref 8.9–10.3)
CO2: 29 mmol/L (ref 22–32)
Chloride: 98 mmol/L — ABNORMAL LOW (ref 101–111)
Creatinine, Ser: 2.91 mg/dL — ABNORMAL HIGH (ref 0.61–1.24)
GFR, EST AFRICAN AMERICAN: 22 mL/min — AB (ref >60)
GFR, EST NON AFRICAN AMERICAN: 19 mL/min — AB (ref >60)
Glucose, Bld: 195 mg/dL — ABNORMAL HIGH (ref 65–99)
POTASSIUM: 3.4 mmol/L — AB (ref 3.5–5.1)
Sodium: 135 mmol/L (ref 135–145)

## 2015-09-09 LAB — ECHOCARDIOGRAM COMPLETE
CHL CUP MV DEC (S): 151
CHL CUP REG VEL DIAS: 103 cm/s
CHL CUP TV REG PEAK VELOCITY: 282 cm/s
E decel time: 151 msec
EERAT: 7.28
FS: 11 % — AB (ref 28–44)
Height: 70 in
IV/PV OW: 0.91
LA vol A4C: 89 ml
LA vol index: 44.2 mL/m2
LA vol: 92.4 mL
LADIAMINDEX: 1.91 cm/m2
LASIZE: 40 mm
LEFT ATRIUM END SYS DIAM: 40 mm
LV E/e' medial: 7.28
LV PW d: 11.6 mm — AB (ref 0.6–1.1)
LV SIMPSON'S DISK: 10
LV dias vol: 157 mL — AB (ref 62–150)
LV sys vol index: 68 mL/m2
LV sys vol: 142 mL — AB (ref 21–61)
LVDIAVOLIN: 75 mL/m2
LVEEAVG: 7.28
LVELAT: 12.6 cm/s
LVOT area: 4.15 cm2
LVOTD: 23 mm
MV VTI: 122 cm
MVPG: 3 mmHg
MVPKEVEL: 91.7 m/s
PISA EROA: 0.24 cm2
Stroke v: 15 ml
TDI e' lateral: 12.6
TRMAXVEL: 282 cm/s
Weight: 3193.6 oz

## 2015-09-09 LAB — GLUCOSE, CAPILLARY: Glucose-Capillary: 197 mg/dL — ABNORMAL HIGH (ref 65–99)

## 2015-09-09 MED ORDER — METOLAZONE 2.5 MG PO TABS
2.5000 mg | ORAL_TABLET | Freq: Once | ORAL | Status: AC
  Administered 2015-09-09: 2.5 mg via ORAL
  Filled 2015-09-09: qty 1

## 2015-09-09 MED ORDER — CARVEDILOL 12.5 MG PO TABS
12.5000 mg | ORAL_TABLET | Freq: Two times a day (BID) | ORAL | Status: DC
  Administered 2015-09-10 – 2015-09-12 (×5): 12.5 mg via ORAL
  Filled 2015-09-09 (×5): qty 1

## 2015-09-09 MED ORDER — METOLAZONE 2.5 MG PO TABS
2.5000 mg | ORAL_TABLET | Freq: Every day | ORAL | Status: DC
  Administered 2015-09-10 – 2015-09-12 (×3): 2.5 mg via ORAL
  Filled 2015-09-09 (×3): qty 1

## 2015-09-09 MED ORDER — POTASSIUM CHLORIDE CRYS ER 20 MEQ PO TBCR
40.0000 meq | EXTENDED_RELEASE_TABLET | Freq: Once | ORAL | Status: AC
  Administered 2015-09-09: 40 meq via ORAL
  Filled 2015-09-09: qty 2

## 2015-09-09 NOTE — Research (Signed)
Apixaban Validation Study (ClinicalTrials.gov Identifier: [id number]) RESEARCH SUBJECT. Purpose: Obtain fresh samples that will be used to assess the performances of STA-Apixaban Calibrator and STA-Apixaban Control in combination with the STA-Liquid Anti-Xa to determine the quantity of apixaban in plasma samples by measurement of its direct anti-Xa activity. Apixaban Validation Study is sponsored by [employer].The fresh samples will collected by completing a one time blood draw on approved patients.   Inclusion Criteria: Must meet AT LEAST one of the following:  weight </= 60kg or >/= 75 years or Hct <39% for male or < 36% for male or renal impairment or co-medication with ASA/NSAIDs, or co-medication with anti-platelet agents.   Apixaban Validation Study Informed Consent   Subject Name: [name] CorneaMarshall G Schomburg  This patient, [name], has been consented to the above clinical trial according to FDA regulations, GCP guidelines and [facility]'s SOPs. The informed consent form and study design have been explained to this patient by this study coordinator at 09:30am on 09/09/2015. The patient demonstrated comprehension of this clinical trial and study requirements/expectations. No study procedures have been initiated before consenting of this patient. The patient was given sufficient time for reading the consent form. All risks, benefits and options have been thoroughly discussed and all questions were answered per the patient's satisfaction. This patient was not coerced in any way to participate in this clinical trial. This patient has voluntarily signed consent version 1.0 at 10:15 am on 09/09/2015. A copy of the signed consent form was given to the patient and a copy was placed in the subject's medical record. Subject was thanked for their participation in research and contribution to science.  Toula MoosFrances J Kenyette Gundy, RN Clinical Research Nurse  [facility] Office: [phone]

## 2015-09-09 NOTE — Progress Notes (Signed)
[facility]- [id number]   [facility] RN Liason Note  This is a GIP, related and covered hospital admission of 09/08/15 with [facility] dx of CHF.  Code status is DNR.  Patient admitted for marked fluid volume overload .   Updated medication list and transfer summary has been placed in shadow chart.   Visited patient at bedside.  Wife is also present.  Patient is alert, oriented and talkative.  He appears in good spirits; he did report some difficulty sleeping last night, but was given a prn Restoril 30mg  at HS which did help.   He reports some mild shortness of breath.   02 in place at 2L.  Denies pain.  He states he did stand up  beside the bed once due to a leg cramp which did help.  Patient does have order for potassium supplement 40 meq po.   The patient does seem to be responding to treatment.  His weight is down 4 lbs from yesterday.   He has received a total of 3 doses of Lasix 80mg  IV since admission yesterday and has a total of 1675L urine output.  Appetite is good.  He has a foley catheter.   There is only a very small amount of urine in bag currently, urine is clear amber.     Patient is being weighed daily and will have daily BMP.        [facility] will continue to follow and anticipate discharge needs.   Please call with any hospice related questions or concerns.     Lavone NeriSusan Wiktoria Hemrick, RN [facility] Liason  [phone]

## 2015-09-09 NOTE — Progress Notes (Signed)
Echocardiogram 2D Echocardiogram has been performed.  [name] 09/09/2015, 1:54 PM

## 2015-09-09 NOTE — Progress Notes (Addendum)
Advanced Heart Failure Rounding Note  Referring Physician: Nanetta Batty, MD Primary Care: [name], MD Primary Cardiologist: Nanetta Batty, MD HF: New (Dr. Gala Romney)  Reason for Admission: Acute on Chronic Combined CHF with marked volume overload.   Subjective:    Feeling about the same this morning.  Hasn't been out of bed.  Breathing is "not worse". Optimistic that he is peeing and swelling seems to be coming down.   Out 1 L (though nurse says he was almost out 2).  Down 4 lbs. Creatinine 3.0 -> 2.9. K 3.4  Objective:   Weight Range: 199 lb 9.6 oz (90.538 kg) Body mass index is 28.64 kg/(m^2).   Vital Signs:   Temp:  [97.5 F (36.4 C)-98.5 F (36.9 C)] 98.5 F (36.9 C) (06/16 0621) Pulse Rate:  [75-90] 75 (06/16 0621) Resp:  [18-20] 20 (06/16 0621) BP: (106-107)/(66-69) 107/67 mmHg (06/16 0621) SpO2:  [93 %-97 %] 97 % (06/16 0733) FiO2 (%):  [28 %] 28 % (06/16 0733) Weight:  [199 lb 9.6 oz (90.538 kg)-203 lb 14.4 oz (92.488 kg)] 199 lb 9.6 oz (90.538 kg) (06/16 0621) Last BM Date: 09/08/15  Weight change: Filed Weights   09/08/15 1431 09/09/15 0621  Weight: 203 lb 14.4 oz (92.488 kg) 199 lb 9.6 oz (90.538 kg)    Intake/Output:   Intake/Output Summary (Last 24 hours) at 09/09/15 0803 Last data filed at 09/09/15 5621  Gross per 24 hour  Intake    243 ml  Output   1675 ml  Net  -1432 ml     Physical Exam: General: Elderly appearing. NAD on 02 via [place].  HEENT: Normal, 02 in place Neck: supple. JVD remains to ear. Carotids 2+ bilat; no bruits. No thyromegaly or nodule noted.  Cor: PMI nondisplaced. Irregularly irregular. No rubs, gallops or murmurs. Lungs: Diminished throughout, bibasilar crackles Abdomen: soft, NT, Mild/mod distention, no HSM. No bruits or masses. +BS  Extremities: no cyanosis, clubbing, rash, 2-3 + edema into thigh Neuro: alert & oriented x 3, cranial nerves grossly intact. moves all 4 extremities w/o difficulty. Affect  pleasant.  Telemetry: Reviewed, Afib  Labs: CBC  Recent Labs  09/08/15 1345  WBC 4.1  HGB 9.8*  HCT 31.6*  MCV 99.4  PLT 125*   Basic Metabolic Panel  Recent Labs  09/08/15 1345 09/09/15 0426  NA 137 135  K 4.0 3.4*  CL 100* 98*  CO2 24 29  GLUCOSE 172* 195*  BUN 67* 66*  CREATININE 3.01* 2.91*  CALCIUM 9.0 8.7*  MG 2.3  --    Liver Function Tests  Recent Labs  09/08/15 1345  AST 17  ALT 9*  ALKPHOS 65  BILITOT 1.2  PROT 6.3*  ALBUMIN 3.4*   No results for input(s): LIPASE, AMYLASE in the last 72 hours. Cardiac Enzymes No results for input(s): CKTOTAL, CKMB, CKMBINDEX, TROPONINI in the last 72 hours.  BNP: BNP (last 3 results)  Recent Labs  05/18/15 1112 08/29/15 1315 09/08/15 1345  BNP 623.5* 2103.2* 2249.8*    ProBNP (last 3 results) No results for input(s): PROBNP in the last 8760 hours.   D-Dimer No results for input(s): DDIMER in the last 72 hours. Hemoglobin A1C No results for input(s): HGBA1C in the last 72 hours. Fasting Lipid Panel No results for input(s): CHOL, HDL, LDLCALC, TRIG, CHOLHDL, LDLDIRECT in the last 72 hours. Thyroid Function Tests  Recent Labs  09/08/15 1345  TSH 1.541    Other results:     Imaging/Studies:  No results found.  Latest Echo  Latest Cath   Medications:     Scheduled Medications: . amLODipine  5 mg Oral Daily  . antiseptic oral rinse  7 mL Mouth Rinse BID  . apixaban  2.5 mg Oral BID  . aspirin EC  81 mg Oral QHS  . carvedilol  25 mg Oral BID WC  . cholecalciferol  1,000 Units Oral Daily  . diphenhydrAMINE  25 mg Oral Daily  . furosemide  80 mg Intravenous BID  . hydrALAZINE  50 mg Oral BID  . isosorbide mononitrate  60 mg Oral Daily  . mometasone-formoterol  2 puff Inhalation BID  . potassium chloride  40 mEq Oral Once  . pravastatin  40 mg Oral QHS  . sodium chloride flush  3 mL Intravenous Q12H  . temazepam  15 mg Oral QHS     Infusions:     PRN  Medications:  sodium chloride, acetaminophen, albuterol, nitroGLYCERIN, ondansetron (ZOFRAN) IV, sodium chloride flush, temazepam   Assessment   1. Acute on chronic combined HF: LVEF 30-35% with Grade 3 diastolic dysfunction 2. PAF - on eliquis 3. CKD stage IV 4. CAD s/p CABG 1994 5. HTN  Plan    He remains markedly volume overloaded but has responded to IV lasix well so far.     Will give 2.5 mg metolazone this am and watch renal function closely.  Supp K.   Place TED hose.   Hopeful that we can diurese patient well and adjust outpatient medicine, but pt aware that if he declines he has few options left to him.  PICC with milrinone is an option if diuresis proves difficult, but he would not be a candidate for advanced therapies or HD.   Length of Stay: 1  Graciella FreerMichael Andrew Tillery PA-C 09/09/2015, 8:03 AM  Advanced Heart Failure Team Pager [phone] (M-F; 7a - 4p)  Please contact [facility] for night-coverage after hours (4p -7a ) and weekends on [email or web address]   Patient seen and examined with Otilio SaberAndy Tillery, PA-C. We discussed all aspects of the encounter. I agree with the assessment and plan as stated above.   He is responding to IV lasix but remains markedly volume overloaded. Continue IV diuresis. Add metolazone. Renal function slightly improved. Supp +  Continue Eliquis for chronic AF. Stop amlodipine. Decrease carvedilol. We discussed potential option of PICC line and milrinone as needed.   Kelby Lotspeich,MD 8:27 PM

## 2015-09-09 NOTE — Care Management Important Message (Signed)
Important Message  Patient Details  Name: [name] CorneaMarshall G [name] MRN: [id number] Date of Birth: [birth year]   Medicare Important Message Given:  Yes    Jonie Burdell, Stephan MinisterSusan Coleman 09/09/2015, 9:22 AM

## 2015-09-09 NOTE — Discharge Instructions (Signed)
Information on my medicine - ELIQUIS (apixaban) --You were taking Eliquis (apixaban) prior to this hospitalization.   This medication education was reviewed with me or my healthcare representative as part of my discharge preparation.  The pharmacist that spoke with me during my hospital stay was:  Windell MouldingRuth Why was Eliquis prescribed for you? Eliquis was prescribed for you to reduce the risk of a blood clot forming that can cause a stroke if you have a medical condition called atrial fibrillation (a type of irregular heartbeat).  What do You need to know about Eliquis ? Take your Eliquis TWICE DAILY - one tablet in the morning and one tablet in the evening with or without food. If you have difficulty swallowing the tablet whole please discuss with your pharmacist how to take the medication safely.  Take Eliquis exactly as prescribed by your doctor and DO NOT stop taking Eliquis without talking to the doctor who prescribed the medication.  Stopping may increase your risk of developing a stroke.  Refill your prescription before you run out.  After discharge, you should have regular check-up appointments with your healthcare provider that is prescribing your Eliquis.  In the future your dose may need to be changed if your kidney function or weight changes by a significant amount or as you get older.  What do you do if you miss a dose? If you miss a dose, take it as soon as you remember on the same day and resume taking twice daily.  Do not take more than one dose of ELIQUIS at the same time to make up a missed dose.  Important Safety Information A possible side effect of Eliquis is bleeding. You should call your healthcare provider right away if you experience any of the following: ? Bleeding from an injury or your nose that does not stop. ? Unusual colored urine (red or dark brown) or unusual colored stools (red or black). ? Unusual bruising for unknown reasons. ? A serious fall or if you hit  your head (even if there is no bleeding).  Some medicines may interact with Eliquis and might increase your risk of bleeding or clotting while on Eliquis. To help avoid this, consult your healthcare provider or pharmacist prior to using any new prescription or non-prescription medications, including herbals, vitamins, non-steroidal anti-inflammatory drugs (NSAIDs) and supplements.  This website has more information on Eliquis (apixaban): http://www.eliquis.com/eliquis/home

## 2015-09-10 LAB — BASIC METABOLIC PANEL
ANION GAP: 8 (ref 5–15)
BUN: 68 mg/dL — ABNORMAL HIGH (ref 6–20)
CALCIUM: 8.9 mg/dL (ref 8.9–10.3)
CO2: 31 mmol/L (ref 22–32)
CREATININE: 2.73 mg/dL — AB (ref 0.61–1.24)
Chloride: 97 mmol/L — ABNORMAL LOW (ref 101–111)
GFR, EST AFRICAN AMERICAN: 24 mL/min — AB (ref >60)
GFR, EST NON AFRICAN AMERICAN: 20 mL/min — AB (ref >60)
Glucose, Bld: 176 mg/dL — ABNORMAL HIGH (ref 65–99)
Potassium: 3.3 mmol/L — ABNORMAL LOW (ref 3.5–5.1)
SODIUM: 136 mmol/L (ref 135–145)

## 2015-09-10 MED ORDER — POTASSIUM CHLORIDE CRYS ER 20 MEQ PO TBCR
40.0000 meq | EXTENDED_RELEASE_TABLET | Freq: Once | ORAL | Status: AC
  Administered 2015-09-10: 40 meq via ORAL
  Filled 2015-09-10: qty 2

## 2015-09-10 MED ORDER — POTASSIUM CHLORIDE ER 10 MEQ PO TBCR
20.0000 meq | EXTENDED_RELEASE_TABLET | Freq: Two times a day (BID) | ORAL | Status: AC
  Administered 2015-09-10 – 2015-09-11 (×4): 20 meq via ORAL
  Filled 2015-09-10 (×8): qty 2

## 2015-09-10 NOTE — Evaluation (Signed)
Physical Therapy Evaluation Patient Details Name: [name] MRN: [id number] DOB: 1933/01/25 Today's Date: 09/10/2015   History of Present Illness  Pt is a 82 y/o M admitted for Acute on Chronic Combined CHF with marked volume overload. Pt's PMH includes CABG, a-fib, LBBB, MI, deficiency anemia, spinal fusion.    Clinical Impression  Pt admitted with above diagnosis. Pt currently with functional limitations due to the deficits listed below (see PT Problem List). [name] presents w/ impaired balance and poor cardiopulmonary status.  SpO2 down as low as 86% on 2L O2, requiring 3L O2 to increase to low 90s.  Pt will have 24/7 assist/supervision available from his wife at d/c. Pt will benefit from skilled PT to increase their independence and safety with mobility to allow discharge to the venue listed below.      Follow Up Recommendations Home health PT;Supervision/Assistance - 24 hour    Equipment Recommendations  None recommended by PT    Recommendations for Other Services OT consult     Precautions / Restrictions Precautions Precautions: Fall Precaution Comments: monitor O2 Restrictions Weight Bearing Restrictions: No      Mobility  Bed Mobility Overal bed mobility: Needs Assistance Bed Mobility: Supine to Sit     Supine to sit: Min guard     General bed mobility comments: Increased time and effort but no physical assist needed.  Transfers Overall transfer level: Needs assistance Equipment used: Rolling walker (2 wheeled) Transfers: Sit to/from Stand Sit to Stand: Min guard         General transfer comment: Min guard for safety and cues for hand placement.  Ambulation/Gait Ambulation/Gait assistance: Min guard Ambulation Distance (Feet): 100 Feet Assistive device: Rolling walker (2 wheeled) Gait Pattern/deviations: Step-through pattern;Decreased stride length;Trunk flexed   Gait velocity interpretation: Below normal speed for age/gender General  Gait Details: Cues for upright posture and pursed lip breathing.  SpO2 down to 86% on 2L O2.  Rises to 91% on 3L O2 after standing rest break.  Stairs            Wheelchair Mobility    Modified Rankin (Stroke Patients Only)       Balance Overall balance assessment: Needs assistance Sitting-balance support: No upper extremity supported;Feet unsupported Sitting balance-Leahy Scale: Good     Standing balance support: Bilateral upper extremity supported;During functional activity Standing balance-Leahy Scale: Poor Standing balance comment: UE support                             Pertinent Vitals/Pain Pain Assessment: Faces Faces Pain Scale: Hurts little more Pain Location: catheter when sitting EOB Pain Descriptors / Indicators: Discomfort Pain Intervention(s): Limited activity within patient's tolerance;Monitored during session;Repositioned    Home Living Family/patient expects to be discharged to:: Private residence Living Arrangements: Spouse/significant other Available Help at Discharge: Family;Available 24 hours/day Type of Home: House Home Access: Stairs to enter Entrance Stairs-Rails: None Entrance Stairs-Number of Steps: 1 Home Layout: One level Home Equipment: Walker - 2 wheels;Walker - 4 wheels;Grab bars - tub/shower      Prior Function Level of Independence: Needs assistance   Gait / Transfers Assistance Needed: Was not using AD.  ADL's / Homemaking Assistance Needed: Assist w/ lower body dressing.  Ind w/ bathing.        Hand Dominance        Extremity/Trunk Assessment   Upper Extremity Assessment: Overall WFL for tasks assessed  Lower Extremity Assessment: Overall WFL for tasks assessed      Cervical / Trunk Assessment: Kyphotic  Communication   Communication: No difficulties  Cognition Arousal/Alertness: Awake/alert Behavior During Therapy: WFL for tasks assessed/performed Overall Cognitive Status: Within  Functional Limits for tasks assessed                      General Comments      Exercises General Exercises - Lower Extremity Ankle Circles/Pumps: AROM;Both;10 reps;Seated      Assessment/Plan    PT Assessment Patient needs continued PT services  PT Diagnosis Difficulty walking   PT Problem List Decreased strength;Decreased activity tolerance;Decreased balance;Decreased knowledge of use of DME;Decreased safety awareness;Cardiopulmonary status limiting activity  PT Treatment Interventions DME instruction;Gait training;Stair training;Functional mobility training;Therapeutic exercise;Therapeutic activities;Balance training;Patient/family education   PT Goals (Current goals can be found in the Care Plan section) Acute Rehab PT Goals Patient Stated Goal: to feel better and go home PT Goal Formulation: With patient/family Time For Goal Achievement: 09/24/15 Potential to Achieve Goals: Good    Frequency Min 3X/week   Barriers to discharge        Co-evaluation               End of Session Equipment Utilized During Treatment: Gait belt;Oxygen Activity Tolerance: Patient tolerated treatment well;Treatment limited secondary to medical complications (Comment) (hypoxia) Patient left: in chair;with call bell/phone within reach;with chair alarm set;with family/visitor present Nurse Communication: Mobility status;Other (comment) (SpO2)         Time: [id number] PT Time Calculation (min) (ACUTE ONLY): 28 min   Charges:   PT Evaluation $PT Eval Low Complexity: 1 Procedure PT Treatments $Gait Training: 8-22 mins   PT G Codes:       [name] PT, DPT  Pager: [phone] Phone: [phone] 09/10/2015, 1:23 PM

## 2015-09-10 NOTE — Progress Notes (Signed)
[facility] ([facility] W/E SW Liaison Note:  This is a GIP, related and covered hospital admission as of 09/08/15 with [facility] diagnosis CHF. Code status is DNR. Patient admitted from home to hospital for marked fluid volume overload.   Visited with patient and spouse at bedside. Patient is alert and oriented, engages easily. He reports he is slowly improving and looking forward to getting out of bed. He continues to report mild shortness of breath and tells me he was given "this thing" to help with his breathing. He continues to be treated with 2L [place] oxygen and scheduled Dulera inhaler. He continues to receive K-Dur. He has received 3 doses of Lasix 80 mg IV over the past 24 hours. Both patient and spouse report having everything they need at this time. They anticipate returning home with hospice when enough fluid has been removed.    [facility] will continue to follow and anticipate discharge needs. Please call [phone] with hospice related questions.  Thank you. Forrestine HimEva Kendle Erker, LCSW [phone]

## 2015-09-10 NOTE — Progress Notes (Addendum)
Advanced Heart Failure Rounding Note  Referring Physician: Jonathan Berry, MD Primary Care: [name], MD Primary Cardiologist: Nanetta Batty, MD HF: New (Dr. Gala Romney)  Reason for Admission: Acute on Chronic Combined CHF with marked volume overload.   Subjective:    Echo images reviewed. EF 20%. Diuresing well. Feels better. Weight down 7 pounds with IV lasix and metolazone. Renal function improving.    Out 1 L (though nurse says he was almost out 2).  Down 4 lbs. Creatinine 3.0 -> 2.9 -> 2.7. K 3.4  Objective:   Weight Range: 87.5 kg (192 lb 14.4 oz) Body mass index is 27.68 kg/(m^2).   Vital Signs:   Temp:  [98 F (36.7 C)-98.7 F (37.1 C)] 98.3 F (36.8 C) (06/17 0902) Pulse Rate:  [74-88] 75 (06/17 0902) Resp:  [18-20] 20 (06/17 0605) BP: (96-114)/(52-64) 96/55 mmHg (06/17 0902) SpO2:  [93 %-98 %] 95 % (06/17 0902) Weight:  [87.5 kg (192 lb 14.4 oz)] 87.5 kg (192 lb 14.4 oz) (06/17 0605) Last BM Date: 09/08/15  Weight change: Filed Weights   09/08/15 1431 09/09/15 0621 09/10/15 0605  Weight: 92.488 kg (203 lb 14.4 oz) 90.538 kg (199 lb 9.6 oz) 87.5 kg (192 lb 14.4 oz)    Intake/Output:   Intake/Output Summary (Last 24 hours) at 09/10/15 0903 Last data filed at 09/10/15 4098  Gross per 24 hour  Intake    720 ml  Output   2250 ml  Net  -1530 ml     Physical Exam: General: Lying in bed NAD on O2 via [place].  HEENT: Normal, 02 in place Neck: supple. JVD 1-12. Carotids 2+ bilat; no bruits. No thyromegaly or nodule noted.  Cor: PMI nondisplaced. Irregularly irregular. No rubs, gallops or murmurs. Lungs: Diminished throughout, bibasilar crackles Abdomen: soft, NT, Mild/mod distention, no HSM. No bruits or masses. +BS  Extremities: no cyanosis, clubbing, rash, 2 + edema into thigh Neuro: alert & oriented x 3, cranial nerves grossly intact. moves all 4 extremities w/o difficulty. Affect pleasant.  Telemetry: Reviewed, Afib  Labs: CBC  Recent  Labs  09/08/15 1345  WBC 4.1  HGB 9.8*  HCT 31.6*  MCV 99.4  PLT 125*   Basic Metabolic Panel  Recent Labs  09/08/15 1345 09/09/15 0426 09/10/15 0300  NA 137 135 136  K 4.0 3.4* 3.3*  CL 100* 98* 97*  CO2 GLUCOSE 172* 195* 176*  BUN 67* 66* 68*  CREATININE 3.01* 2.91* 2.73*  CALCIUM 9.0 8.7* 8.9  MG 2.3  --   --    Liver Function Tests  Recent Labs  09/08/15 1345  AST 17  ALT 9*  ALKPHOS 65  BILITOT 1.2  PROT 6.3*  ALBUMIN 3.4*   No results for input(s): LIPASE, AMYLASE in the last 72 hours. Cardiac Enzymes No results for input(s): CKTOTAL, CKMB, CKMBINDEX, TROPONINI in the last 72 hours.  BNP: BNP (last 3 results)  Recent Labs  05/18/15 1112 08/29/15 1315 09/08/15 1345  BNP 623.5* 2103.2* 2249.8*    ProBNP (last 3 results) No results for input(s): PROBNP in the last 8760 hours.   D-Dimer No results for input(s): DDIMER in the last 72 hours. Hemoglobin A1C No results for input(s): HGBA1C in the last 72 hours. Fasting Lipid Panel No results for input(s): CHOL, HDL, LDLCALC, TRIG, CHOLHDL, LDLDIRECT in the last 72 hours. Thyroid Function Tests  Recent Labs  09/08/15 13Nanetta Batty41    Other results:  Imaging/Studies:  No results found.  Latest Echo  Latest Cath   Medications:     Scheduled Medications: . antiseptic oral rinse  7 mL Mouth Rinse BID  . apixaban  2.5 mg Oral BID  . aspirin EC  81 mg Oral QHS  . carvedilol  12.5 mg Oral BID WC  . cholecalciferol  1,000 Units Oral Daily  . diphenhydrAMINE  25 mg Oral Daily  . furosemide  80 mg Intravenous BID  . hydrALAZINE  50 mg Oral BID  . isosorbide mononitrate  60 mg Oral Daily  . metolazone  2.5 mg Oral Daily  . mometasone-formoterol  2 puff Inhalation BID  . pravastatin  40 mg Oral QHS  . sodium chloride flush  3 mL Intravenous Q12H    Infusions:    PRN Medications: sodium chloride, acetaminophen, albuterol, nitroGLYCERIN, ondansetron (ZOFRAN) IV,  sodium chloride flush, temazepam   Assessment   1. Acute on chronic combined HF: LVEF 30-35% with Grade 3 diastolic dysfunction 2. PAF - on eliquis 3. CKD stage IV 4. CAD s/p CABG 1994 5. HTN  Plan    Echo images reviewed. EF 20%. Diuresing well. Weight down 7 pounds with IV lasix and metolazone. Renal function improving. Still volume overloaded. Will continue diuresis at least one more day. Supp K+. Continue Eliquis for AF. PT to see.  B-blocker cut back yesterday. Can cut back further as needed.   Length of Stay: 2  Arvilla MeresBensimhon, Jahlisa Rossitto MD  09/10/2015, 9:03 AM  Advanced Heart Failure Team Pager [phone] (M-F; 7a - 4p)  Please contact [facility] for night-coverage after hours (4p -7a ) and weekends on [email or web address]

## 2015-09-11 DIAGNOSIS — I5023 Acute on chronic systolic (congestive) heart failure: Secondary | ICD-10-CM

## 2015-09-11 LAB — BASIC METABOLIC PANEL
ANION GAP: 8 (ref 5–15)
BUN: 70 mg/dL — ABNORMAL HIGH (ref 6–20)
CALCIUM: 8.9 mg/dL (ref 8.9–10.3)
CO2: 33 mmol/L — ABNORMAL HIGH (ref 22–32)
Chloride: 95 mmol/L — ABNORMAL LOW (ref 101–111)
Creatinine, Ser: 2.54 mg/dL — ABNORMAL HIGH (ref 0.61–1.24)
GFR, EST AFRICAN AMERICAN: 26 mL/min — AB (ref >60)
GFR, EST NON AFRICAN AMERICAN: 22 mL/min — AB (ref >60)
Glucose, Bld: 166 mg/dL — ABNORMAL HIGH (ref 65–99)
Potassium: 3.7 mmol/L (ref 3.5–5.1)
Sodium: 136 mmol/L (ref 135–145)

## 2015-09-11 NOTE — Progress Notes (Signed)
Advanced Heart Failure Rounding Note  Referring Physician: Nanetta Batty, MD Primary Care: [name], MD Primary Cardiologist: Nanetta Batty, MD HF: New (Dr. Gala Romney)  Reason for Admission: Acute on Chronic Combined CHF with marked volume overload.   Subjective:    Echo images reviewed. EF 20%. Diuresing well. Feels better. Weight down another 5 pounds. (12 pounds total) with IV lasix and metolazone. Renal function improving.     Objective:   Weight Range: 84.868 kg (187 lb 1.6 oz) Body mass index is 26.85 kg/(m^2).   Vital Signs:   Temp:  [97.7 F (36.5 C)-98.5 F (36.9 C)] 97.8 F (36.6 C) (06/18 0358) Pulse Rate:  [67-85] 85 (06/18 0600) Resp:  [16-18] 16 (06/18 0358) BP: (94-106)/(60-70) 94/60 mmHg (06/18 0600) SpO2:  [92 %-98 %] 92 % (06/18 0853) Weight:  [84.868 kg (187 lb 1.6 oz)] 84.868 kg (187 lb 1.6 oz) (06/18 0358) Last BM Date: 09/08/15  Weight change: Filed Weights   09/09/15 0621 09/10/15 0605 09/11/15 0358  Weight: 90.538 kg (199 lb 9.6 oz) 87.5 kg (192 lb 14.4 oz) 84.868 kg (187 lb 1.6 oz)    Intake/Output:   Intake/Output Summary (Last 24 hours) at 09/11/15 0905 Last data filed at 09/11/15 0700  Gross per 24 hour  Intake    600 ml  Output   3725 ml  Net  -3125 ml     Physical Exam: General: Lying in bed NAD on O2 via [place].  HEENT: Normal, 02 in place Neck: supple. JVP 7  Carotids 2+ bilat; no bruits. No thyromegaly or nodule noted.  Cor: PMI nondisplaced. Irregularly irregular. No rubs, gallops or murmurs. Lungs: Diminished throughout, bibasilar crackles Abdomen: soft, NT, Mild/mod distention, no HSM. No bruits or masses. +BS  Extremities: no cyanosis, clubbing, rash, mild thigh edema  +scrotal and penile edema Neuro: alert & oriented x 3, cranial nerves grossly intact. moves all 4 extremities w/o difficulty. Affect pleasant.  Telemetry: Reviewed, Afib  Labs: CBC  Recent Labs  09/08/15 1345  WBC 4.1  HGB 9.8*  HCT  31.6*  MCV 99.4  PLT 125*   Basic Metabolic Panel  Recent Labs  09/08/15 1345  09/10/15 0300 09/11/15 0248  NA 137  < > 136 136  K 4.0  < > 3.3* 3.7  CL 100*  < > 97* 95*  CO2 24  < > 31 33*  GLUCOSE 172*  < > 176* 166*  BUN 67*  < > 68* 70*  CREATININE 3.01*  < > 2.73* 2.54*  CALCIUM 9.0  < > 8.9 8.9  MG 2.3  --   --   --   < > = values in this interval not displayed. Liver Function Tests  Recent Labs  09/08/15 1345  AST 17  ALT 9*  ALKPHOS 65  BILITOT 1.2  PROT 6.3*  ALBUMIN 3.4*   No results for input(s): LIPASE, AMYLASE in the last 72 hours. Cardiac Enzymes No results for input(s): CKTOTAL, CKMB, CKMBINDEX, TROPONINI in the last 72 hours.  BNP: BNP (last 3 results)  Recent Labs  05/18/15 1112 08/29/15 1315 09/08/15 1345  BNP 623.5* 2103.2* 2249.8*    ProBNP (last 3 results) No results for input(s): PROBNP in the last 8760 hours.   D-Dimer No results for input(s): DDIMER in the last 72 hours. Hemoglobin A1C No results for input(s): HGBA1C in the last 72 hours. Fasting Lipid Panel No results for input(s): CHOL, HDL, LDLCALC, TRIG, CHOLHDL, LDLDIRECT in the last 72 hours.  Thyroid Function Tests  Recent Labs  09/08/15 1345  TSH 1.541    Other results:     Imaging/Studies:  No results found.  Latest Echo  Latest Cath   Medications:     Scheduled Medications: . antiseptic oral rinse  7 mL Mouth Rinse BID  . apixaban  2.5 mg Oral BID  . aspirin EC  81 mg Oral QHS  . carvedilol  12.5 mg Oral BID WC  . cholecalciferol  1,000 Units Oral Daily  . diphenhydrAMINE  25 mg Oral Daily  . furosemide  80 mg Intravenous BID  . hydrALAZINE  50 mg Oral BID  . isosorbide mononitrate  60 mg Oral Daily  . metolazone  2.5 mg Oral Daily  . mometasone-formoterol  2 puff Inhalation BID  . potassium chloride  20 mEq Oral BID  . pravastatin  40 mg Oral QHS  . sodium chloride flush  3 mL Intravenous Q12H    Infusions:    PRN  Medications: sodium chloride, acetaminophen, albuterol, nitroGLYCERIN, ondansetron (ZOFRAN) IV, sodium chloride flush, temazepam   Assessment   1. Acute on chronic combined HF: LVEF 30-35% with Grade 3 diastolic dysfunction 2. PAF - on eliquis 3. CKD stage IV 4. CAD s/p CABG 1994 5. HTN  Plan    Echo images reviewed. EF 20%.   Diuresing well. Weight down 12 pounds with IV lasix and metolazone. Renal function improving. Still with scrotal and penile edema sow ill continue IV lasix one more day. Switch to oral torsemide in am. Hopefully can remove Foley tomorrow. Check UA.   Continue Eliquis for AF. PT seeing.  B-blocker cut back. Can cut back further as needed.   Length of Stay: 3  Tyreshia Ingman MD  09/11/2015, 9:05 AM  Advanced Heart Failure Team Pager [phone] (M-F; 7a - 4p)  Please contact [facility] for night-coverage after hours (4p -7a ) and weekends on [email or web address]

## 2015-09-11 NOTE — Progress Notes (Signed)
GIP visit- [facility] 3E 11- [facility]- Ulis Riasrin Osborne RN  This is a GIP related and covered hospital admission of 09/08/15 with [facility] dx of CHF. Code status is DNR. Patient admitted for marked fluid volume overload. Patient alert and oriented states he feels better and hoping to go home Monday. Patient reports they changed his Lasix to a different diuretic and changed his Symbicort to [facility] which he likes much better. Patient asking if he can take the inhaler home that he has been using in the hospital, please follow up. Patient has had a 12 total weight loss as of today, no edema noted to bilateral lower extremities. Family at bedside no concerns at this time.   Please call with any hospice concerns [phone] Thank you Ulis RiasErin Osborne RN BSN [facility]

## 2015-09-12 LAB — BASIC METABOLIC PANEL
Anion gap: 11 (ref 5–15)
BUN: 66 mg/dL — ABNORMAL HIGH (ref 6–20)
CALCIUM: 9.3 mg/dL (ref 8.9–10.3)
CHLORIDE: 94 mmol/L — AB (ref 101–111)
CO2: 35 mmol/L — ABNORMAL HIGH (ref 22–32)
CREATININE: 2.53 mg/dL — AB (ref 0.61–1.24)
GFR calc non Af Amer: 22 mL/min — ABNORMAL LOW (ref >60)
GFR, EST AFRICAN AMERICAN: 26 mL/min — AB (ref >60)
Glucose, Bld: 155 mg/dL — ABNORMAL HIGH (ref 65–99)
Potassium: 3.3 mmol/L — ABNORMAL LOW (ref 3.5–5.1)
SODIUM: 140 mmol/L (ref 135–145)

## 2015-09-12 MED ORDER — TORSEMIDE 20 MG PO TABS: 60.0000 mg | ORAL_TABLET | Freq: Two times a day (BID) | ORAL | Status: DC

## 2015-09-12 MED ORDER — MOMETASONE FURO-FORMOTEROL FUM 200-5 MCG/ACT IN AERO: 2.0000 | INHALATION_SPRAY | Freq: Two times a day (BID) | RESPIRATORY_TRACT | Status: DC

## 2015-09-12 MED ORDER — TORSEMIDE 20 MG PO TABS: 40.0000 mg | ORAL_TABLET | Freq: Two times a day (BID) | ORAL | Status: DC

## 2015-09-12 MED ORDER — POTASSIUM CHLORIDE CRYS ER 20 MEQ PO TBCR
40.0000 meq | EXTENDED_RELEASE_TABLET | Freq: Once | ORAL | Status: AC
  Administered 2015-09-12: 40 meq via ORAL
  Filled 2015-09-12: qty 2

## 2015-09-12 MED ORDER — TORSEMIDE 20 MG PO TABS: 40.0000 mg | ORAL_TABLET | Freq: Two times a day (BID) | ORAL | Status: AC

## 2015-09-12 MED ORDER — METOLAZONE 2.5 MG PO TABS: 2.5000 mg | ORAL_TABLET | ORAL | Status: AC | PRN

## 2015-09-12 MED ORDER — CARVEDILOL 6.25 MG PO TABS: 6.2500 mg | ORAL_TABLET | Freq: Two times a day (BID) | ORAL | Status: DC

## 2015-09-12 NOTE — Consult Note (Signed)
   [facility] Consult   09/12/2015  [name] [birth year] [id number]  Patient screened for potential [facility] Care Management services. Patient is eligible for [facility] Care [facility] Services. Electronic medical record reveals patient's is active with Hospice Care and  there were no identifiable [facility] care [facility] needs at this time. [facility] Care [facility] services not appropriate at this time. If patient's post hospital needs change please place a [facility] Care [facility] consult. For questions please contact:   Charlesetta ShanksVictoria Deondrea Aguado, RN BSN CCM [facility] Liaison  [phone] business mobile phone Toll free office [phone]

## 2015-09-12 NOTE — Progress Notes (Signed)
[facility]- 3E-11 [facility] RN Liason Note This is a GIP, related and covered hospital admission of 09/08/15 with [facility] dx of CHF. Code status is DNR. Patient admitted for marked fluid volume overload .   Visited patient at bedside. Wife present. He is sitting up in chair eating breakfast. Alert, oriented pleasant. States he is hoping to discharge home today.  Patient reports he has lost 25 lbs.   Lower extremity edema significantly improved.  Foley catheter removed yesterday. Patient reports no difficulty voiding.  Appetite remains good.  States he has received his last IV lasix dose.  He is now on Metolazone 2.5mg  daily po.   Also states his Symbicort has been changed to [facility].  He has not required any PRN meds.    [facility] will continue to follow and assist with any discharge needs. Please call with any questions or concerns. Thank you!    Lavone NeriSusan Chadrick Sprinkle, Rn  [facility] Liason  [phone]

## 2015-09-12 NOTE — Progress Notes (Signed)
Advanced Heart Failure Rounding Note  Referring Physician: Nanetta Batty, MD Primary Care: [name], MD Primary Cardiologist: Nanetta Batty, MD HF: New (Dr. Gala Romney)  Reason for Admission: Acute on Chronic Combined CHF with marked volume overload.   Subjective:   Brisk diuresis over night. Overall weight down 22 pounds.   Wants to go home. Denies SOB.   Objective:   Weight Range: 181 lb (82.101 kg) Body mass index is 25.97 kg/(m^2).   Vital Signs:   Temp:  [97.6 F (36.4 C)-98.4 F (36.9 C)] 98.3 F (36.8 C) (06/19 0503) Pulse Rate:  [78-95] 86 (06/19 0503) Resp:  [17-19] 19 (06/19 0503) BP: (104-115)/(53-69) 106/61 mmHg (06/19 0503) SpO2:  [94 %-96 %] 95 % (06/19 0856) Weight:  [181 lb (82.101 kg)-192 lb 9.6 oz (87.363 kg)] 181 lb (82.101 kg) (06/19 0503) Last BM Date: 09/08/15  Weight change: Filed Weights   09/11/15 0358 09/12/15 0424 09/12/15 0503  Weight: 187 lb 1.6 oz (84.868 kg) 192 lb 9.6 oz (87.363 kg) 181 lb (82.101 kg)    Intake/Output:   Intake/Output Summary (Last 24 hours) at 09/12/15 0944 Last data filed at 09/12/15 0735  Gross per 24 hour  Intake    100 ml  Output   4325 ml  Net  -4225 ml     Physical Exam: General: Lying in bed NAD on O2 via [place]. Wife at bedside HEENT: Normal, 02 in place Neck: supple. JVP 7-8   Carotids 2+ bilat; no bruits. No thyromegaly or nodule noted.  Cor: PMI nondisplaced. Irregularly irregular. No rubs, gallops or murmurs. Lungs: Diminished throughout, bibasilar crackles Abdomen: soft, NT, Mild/mod distention, no HSM. No bruits or masses. +BS  Extremities: no cyanosis, clubbing, rash, R and LLE trace-1+ edemamild thigh edema  +scrotal and penile edema Neuro: alert & oriented x 3, cranial nerves grossly intact. moves all 4 extremities w/o difficulty. Affect pleasant.  Telemetry: Reviewed, Afib  Labs: CBC No results for input(s): WBC, NEUTROABS, HGB, HCT, MCV, PLT in the last 72 hours. Basic  Metabolic Panel  Recent Labs  09/11/15 0248 09/12/15 0343  NA 136 140  K 3.7 3.3*  CL 95* 94*  CO2 33* 35*  GLUCOSE 166* 155*  BUN 70* 66*  CREATININE 2.54* 2.53*  CALCIUM 8.9 9.3   Liver Function Tests No results for input(s): AST, ALT, ALKPHOS, BILITOT, PROT, ALBUMIN in the last 72 hours. No results for input(s): LIPASE, AMYLASE in the last 72 hours. Cardiac Enzymes No results for input(s): CKTOTAL, CKMB, CKMBINDEX, TROPONINI in the last 72 hours.  BNP: BNP (last 3 results)  Recent Labs  05/18/15 1112 08/29/15 1315 09/08/15 1345  BNP 623.5* 2103.2* 2249.8*    ProBNP (last 3 results) No results for input(s): PROBNP in the last 8760 hours.   D-Dimer No results for input(s): DDIMER in the last 72 hours. Hemoglobin A1C No results for input(s): HGBA1C in the last 72 hours. Fasting Lipid Panel No results for input(s): CHOL, HDL, LDLCALC, TRIG, CHOLHDL, LDLDIRECT in the last 72 hours. Thyroid Function Tests No results for input(s): TSH, T4TOTAL, T3FREE, THYROIDAB in the last 72 hours.  Invalid input(s): FREET3  Other results:     Imaging/Studies:  No results found.  Latest Echo  Latest Cath   Medications:     Scheduled Medications: . antiseptic oral rinse  7 mL Mouth Rinse BID  . apixaban  2.5 mg Oral BID  . aspirin EC  81 mg Oral QHS  . carvedilol  12.5 mg Oral  BID WC  . cholecalciferol  1,000 Units Oral Daily  . diphenhydrAMINE  25 mg Oral Daily  . furosemide  80 mg Intravenous BID  . hydrALAZINE  50 mg Oral BID  . isosorbide mononitrate  60 mg Oral Daily  . metolazone  2.5 mg Oral Daily  . mometasone-formoterol  2 puff Inhalation BID  . pravastatin  40 mg Oral QHS  . sodium chloride flush  3 mL Intravenous Q12H    Infusions:    PRN Medications: sodium chloride, acetaminophen, albuterol, nitroGLYCERIN, ondansetron (ZOFRAN) IV, sodium chloride flush, temazepam   Assessment   1. Acute on chronic combined HF: LVEF 30-35% with Grade 3  diastolic dysfunction 2. PAF - on eliquis 3. CKD stage IV 4. CAD s/p CABG 1994 5. HTN  Plan    Echo images reviewed. EF 20%.  Volume status much improved. Stop IV lasix and transition to torsemide 60 mg twice a day. B-blocker cut back. Continue carvedilol 12.5 mg twice a day. Will continue metolazone as needed.   Continue Eliquis for AF. PT seeing.  Anticipate d/c with hospice today.   Length of Stay: 4  Amy Clegg NP-C  09/12/2015, 9:44 AM  Advanced Heart Failure Team Pager [phone] (M-F; 7a - 4p)  Please contact [facility] for night-coverage after hours (4p -7a ) and weekends on [email or web address]  Patient seen and examined with Tonye BecketAmy Clegg, NP. We discussed all aspects of the encounter. I agree with the assessment and plan as stated above.   Volume status much improved. Weight down 22 pounds. Can send home on torsemide 40 bid. Carvedilol cut back to 6.25 bid. Follow closely in HF Clinic  Narda Fundora,MD 2:03 PM

## 2015-09-12 NOTE — Discharge Summary (Signed)
Advanced Heart Failure Team  Discharge Summary   Patient ID: [name] MRN: [id number], DOB/AGE: 1932/08/13 82 y.o. Admit date: 09/08/2015 D/C date:     09/12/2015   Primary Discharge Diagnoses:   1. Acute on chronic combined HF: LVEF 30-35% with Grade 3 diastolic dysfunction 2. PAF - on eliquis 3. CKD stage IV 4. CAD s/p CABG 1994 5. HTN   Hospital Course:  MAKS CAVALLERO is a 82 y.o. male with a complicated medical history including CAD with CABG 1994, Chronic systolic HF Echo 9/43/91 LVEF 29-90%, Paroxysmal Atrial fibrillation on renally adjusted Eliquis,  and HLD.   Admitted from HF clinic with marked volume overload. Prior to admit he had poor response to increased po diuretics. Diuresed with IV lasix and transitioned to torsemide 40 mg twice a day with metolazone as needed for 5 pound weight gain in 24 hours. Overall diuresed 21 pounds. BB was cut back due to soft BP. ECHO was repeated and showed EF down 20%.   Renal function was followed closely and remained stable. He remained in A fib with rate control. Continue eliquis for anticoagulation. Today [facility]. He will follow up in the HF in a few weeks.    Discharge Weight: 181 pounds  Discharge Vitals: Blood pressure 103/63, pulse 81, temperature 98.3 F (36.8 C), temperature source Oral, resp. rate 16, height 5\' 10"  (1.778 m), weight 181 lb (82.101 kg), SpO2 97 %.  Labs: Lab Results  Component Value Date   WBC 4.1 09/08/2015   HGB 9.8* 09/08/2015   HCT 31.6* 09/08/2015   MCV 99.4 09/08/2015   PLT 125* 09/08/2015    Recent Labs Lab 09/08/15 1345  09/12/15 0343  NA 137  < > 140  K 4.0  < > 3.3*  CL 100*  < > 94*  CO2 24  < > 35*  BUN 67*  < > 66*  CREATININE 3.01*  < > 2.53*  CALCIUM 9.0  < > 9.3  PROT 6.3*  --   --   BILITOT 1.2  --   --   ALKPHOS 65  --   --   ALT 9*  --   --   AST 17  --   --   GLUCOSE 172*  < > 155*  < > = values in this  interval not displayed. Lab Results  Component Value Date   CHOL  07/29/2008    103        ATP III CLASSIFICATION:  <200     mg/dL   Desirable  09/28/2008  mg/dL   Borderline High  205-793    mg/dL   High          HDL 29* 07/29/2008   LDLCALC  07/29/2008    51        Total Cholesterol/HDL:CHD Risk Coronary Heart Disease Risk Table                     Men   Women  1/2 Average Risk   3.4   3.3  Average Risk       5.0   4.4  2 X Average Risk   9.6   7.1  3 X Average Risk  23.4   11.0        Use the calculated Patient Ratio above and the CHD Risk Table to determine the patient's CHD Risk.        ATP  III CLASSIFICATION (LDL):  <100     mg/dL   Optimal  100-129  mg/dL   Near or Above                    Optimal  130-159  mg/dL   Borderline  160-189  mg/dL   High  >190     mg/dL   Very High   TRIG 114 07/29/2008   BNP (last 3 results)  Recent Labs  05/18/15 1112 08/29/15 1315 09/08/15 1345  BNP 623.5* 2103.2* 2249.8*    ProBNP (last 3 results) No results for input(s): PROBNP in the last 8760 hours.   Diagnostic Studies/Procedures   No results found.  Discharge Medications     Medication List    STOP taking these medications        amLODipine 5 MG tablet  Commonly known as:  NORVASC     aspirin 81 MG tablet     budesonide-formoterol 160-4.5 MCG/ACT inhaler  Commonly known as:  SYMBICORT  Replaced by:  mometasone-formoterol 200-5 MCG/ACT Aero     furosemide 40 MG tablet  Commonly known as:  LASIX      TAKE these medications        acetaminophen 325 MG tablet  Commonly known as:  TYLENOL  Take 650 mg by mouth every 6 (six) hours as needed for pain.     albuterol 108 (90 Base) MCG/ACT inhaler  Commonly known as:  PROVENTIL HFA;VENTOLIN HFA  Inhale 2 puffs into the lungs every 6 (six) hours as needed for wheezing or shortness of breath.     apixaban 2.5 MG Tabs tablet  Commonly known as:  ELIQUIS  Take 2 tablets (5 mg total) by mouth 2 (two) times  daily.     carvedilol 6.25 MG tablet  Commonly known as:  COREG  Take 1 tablet (6.25 mg total) by mouth 2 (two) times daily with a meal.     cholecalciferol 1000 units tablet  Commonly known as:  VITAMIN D  Take 1,000 Units by mouth daily.     diphenhydrAMINE 25 MG tablet  Commonly known as:  BENADRYL  Take 25 mg by mouth daily.     glucose monitoring kit monitoring kit  1 each by Does not apply route as needed for other.     hydrALAZINE 50 MG tablet  Commonly known as:  APRESOLINE  Take 1 tablet (50 mg total) by mouth 2 (two) times daily.     isosorbide mononitrate 60 MG 24 hr tablet  Commonly known as:  IMDUR  Take 60 mg by mouth daily.     metolazone 2.5 MG tablet  Commonly known as:  ZAROXOLYN  Take 1 tablet (2.5 mg total) by mouth as needed (5 pound weight gain in 24 hours).     mometasone-formoterol 200-5 MCG/ACT Aero  Commonly known as:  DULERA  Inhale 2 puffs into the lungs 2 (two) times daily.     nitroGLYCERIN 0.4 MG SL tablet  Commonly known as:  NITROSTAT  Place 0.4 mg under the tongue every 5 (five) minutes as needed for chest pain. Reported on 09/08/2015     pravastatin 40 MG tablet  Commonly known as:  PRAVACHOL  Take 40 mg by mouth at bedtime.     temazepam 30 MG capsule  Commonly known as:  RESTORIL  Take 30 mg by mouth at bedtime as needed for sleep.     torsemide 20 MG tablet  Commonly known as:  DEMADEX  Take 2 tablets (40 mg total) by mouth 2 (two) times daily.        Disposition   The patient will be discharged in stable condition to home.     Discharge Instructions    Diet - low sodium heart healthy    Complete by:  As directed      Heart Failure patients record your daily weight using the same scale at the same time of day    Complete by:  As directed      Increase activity slowly    Complete by:  As directed           Follow-up Information    Follow up with Glori Bickers, MD On 09/26/2015.   Specialty:  Cardiology   Why:   at 10:40 Amber information:   [place] [address] [phone]         Duration of Discharge Encounter: Greater than 35 minutes   Signed, Darrick Grinder NP-C   09/12/2015, 2:16 PM   Patient seen and examined with Darrick Grinder, NP. We discussed all aspects of the encounter. I agree with the assessment and plan as stated above.   He is ready for d/c. Change lasix to torsemide. Follow closely in HF clinic.   Bensimhon, Daniel,MD 3:25 PM

## 2015-09-12 NOTE — Progress Notes (Signed)
[facility]) Social Work (SW) note:  Visited with [name], wife at bedside, they stated they think they will be able to go home today, [name] stated he was feeling better "than before", wife stated "he's lost 20 pounds since he got here". Plan is for [name] to return home at hospital discharge. [facility] will continue to visit [name] in his home for support/education/link to resources. Please call with questions/concerns [phone]. Lorain ChildesMolly Lyle, LCSW

## 2015-09-12 NOTE — Care Management Important Message (Signed)
Important Message  Patient Details  Name: [name] MRN: [id number] Date of Birth: 1933-02-01   Medicare Important Message Given:  Yes    Bernadette HoitShoffner, Clifton Kovacic Coleman 09/12/2015, 10:11 AM

## 2015-09-13 ENCOUNTER — Telehealth: Payer: Self-pay | Admitting: Cardiovascular Disease

## 2015-09-13 ENCOUNTER — Ambulatory Visit: Payer: Medicare Other | Admitting: Cardiovascular Disease

## 2015-09-13 MED ORDER — HYDRALAZINE HCL 50 MG PO TABS: 50.0000 mg | ORAL_TABLET | Freq: Two times a day (BID) | ORAL | Status: DC

## 2015-09-13 MED ORDER — PRAVASTATIN SODIUM 40 MG PO TABS: 40.0000 mg | ORAL_TABLET | Freq: Every day | ORAL | Status: DC

## 2015-09-13 MED ORDER — APIXABAN 2.5 MG PO TABS: 2.5000 mg | ORAL_TABLET | Freq: Two times a day (BID) | ORAL | Status: AC

## 2015-09-13 MED ORDER — ISOSORBIDE MONONITRATE ER 60 MG PO TB24: 60.0000 mg | ORAL_TABLET | Freq: Every day | ORAL | Status: DC

## 2015-09-13 MED ORDER — AMLODIPINE BESYLATE 5 MG PO TABS: 5.0000 mg | ORAL_TABLET | Freq: Every day | ORAL | Status: DC

## 2015-09-13 MED ORDER — MOMETASONE FURO-FORMOTEROL FUM 200-5 MCG/ACT IN AERO: 2.0000 | INHALATION_SPRAY | Freq: Two times a day (BID) | RESPIRATORY_TRACT | Status: AC

## 2015-09-13 NOTE — Telephone Encounter (Signed)
New message     *STAT* If patient is at the pharmacy, call can be transferred to refill team.   1. Which medications need to be refilled? (please list name of each medication and dose if known) Hydralazine 50 mg twice daily, norvasc 5 mg po daily, Imdur 60 mg po daily, Eliquis 2.5 mg po daily. Pravastatin 40 mg po daily 2. Which pharmacy/location (including street and city if local pharmacy) is medication to be sent to?[facility]  3. Do they need a 30 day or 90 day supply? Not provided in phone call    The pt also need a prescription for [facility] 2 puffs twice daily

## 2015-09-13 NOTE — Telephone Encounter (Signed)
[facility].Dulera prescription sent to [facility].

## 2015-09-13 NOTE — Telephone Encounter (Signed)
Returned call to [place] with Hospice.Left message to call me back with [facility]

## 2015-09-13 NOTE — Telephone Encounter (Signed)
Rx has been sent to the pharmacy electronically. ° °

## 2015-09-13 NOTE — Telephone Encounter (Signed)
New message    The pt according to the hospice nurse needs a prescription   For [facility] inhal[facility] 2 puffs twice daily

## 2015-09-21 ENCOUNTER — Other Ambulatory Visit: Payer: Medicare Other

## 2015-09-21 ENCOUNTER — Ambulatory Visit: Payer: Medicare Other

## 2015-09-26 ENCOUNTER — Ambulatory Visit (HOSPITAL_COMMUNITY)
Admit: 2015-09-26 | Discharge: 2015-09-26 | Disposition: A | Discharge status: Home / Self Care | Source: Ambulatory Visit | Attending: Internal Medicine | Admitting: Internal Medicine

## 2015-09-26 VITALS — BP 104/62 | HR 90 | Resp 18 | Wt 177.2 lb

## 2015-09-26 DIAGNOSIS — E1122 Type 2 diabetes mellitus with diabetic chronic kidney disease: Secondary | ICD-10-CM | POA: Insufficient documentation

## 2015-09-26 DIAGNOSIS — M1990 Unspecified osteoarthritis, unspecified site: Secondary | ICD-10-CM | POA: Insufficient documentation

## 2015-09-26 DIAGNOSIS — I252 Old myocardial infarction: Secondary | ICD-10-CM | POA: Insufficient documentation

## 2015-09-26 DIAGNOSIS — N184 Chronic kidney disease, stage 4 (severe): Secondary | ICD-10-CM | POA: Diagnosis not present

## 2015-09-26 DIAGNOSIS — I2510 Atherosclerotic heart disease of native coronary artery without angina pectoris: Secondary | ICD-10-CM | POA: Diagnosis not present

## 2015-09-26 DIAGNOSIS — Z7901 Long term (current) use of anticoagulants: Secondary | ICD-10-CM | POA: Diagnosis not present

## 2015-09-26 DIAGNOSIS — Z7982 Long term (current) use of aspirin: Secondary | ICD-10-CM | POA: Insufficient documentation

## 2015-09-26 DIAGNOSIS — Z951 Presence of aortocoronary bypass graft: Secondary | ICD-10-CM | POA: Diagnosis not present

## 2015-09-26 DIAGNOSIS — R0609 Other forms of dyspnea: Secondary | ICD-10-CM | POA: Diagnosis not present

## 2015-09-26 DIAGNOSIS — I482 Chronic atrial fibrillation: Secondary | ICD-10-CM | POA: Diagnosis not present

## 2015-09-26 DIAGNOSIS — I255 Ischemic cardiomyopathy: Secondary | ICD-10-CM

## 2015-09-26 DIAGNOSIS — E785 Hyperlipidemia, unspecified: Secondary | ICD-10-CM | POA: Diagnosis not present

## 2015-09-26 DIAGNOSIS — K219 Gastro-esophageal reflux disease without esophagitis: Secondary | ICD-10-CM | POA: Diagnosis not present

## 2015-09-26 DIAGNOSIS — Z79899 Other long term (current) drug therapy: Secondary | ICD-10-CM | POA: Insufficient documentation

## 2015-09-26 DIAGNOSIS — Z87891 Personal history of nicotine dependence: Secondary | ICD-10-CM | POA: Diagnosis not present

## 2015-09-26 DIAGNOSIS — I739 Peripheral vascular disease, unspecified: Secondary | ICD-10-CM | POA: Insufficient documentation

## 2015-09-26 DIAGNOSIS — Z66 Do not resuscitate: Secondary | ICD-10-CM | POA: Diagnosis not present

## 2015-09-26 DIAGNOSIS — I130 Hypertensive heart and chronic kidney disease with heart failure and stage 1 through stage 4 chronic kidney disease, or unspecified chronic kidney disease: Secondary | ICD-10-CM | POA: Insufficient documentation

## 2015-09-26 DIAGNOSIS — I509 Heart failure, unspecified: Secondary | ICD-10-CM | POA: Insufficient documentation

## 2015-09-26 DIAGNOSIS — Z888 Allergy status to other drugs, medicaments and biological substances status: Secondary | ICD-10-CM | POA: Insufficient documentation

## 2015-09-26 LAB — BASIC METABOLIC PANEL
ANION GAP: 9 (ref 5–15)
BUN: 81 mg/dL — ABNORMAL HIGH (ref 6–20)
CALCIUM: 9.1 mg/dL (ref 8.9–10.3)
CO2: 28 mmol/L (ref 22–32)
CREATININE: 2.67 mg/dL — AB (ref 0.61–1.24)
Chloride: 99 mmol/L — ABNORMAL LOW (ref 101–111)
GFR, EST AFRICAN AMERICAN: 24 mL/min — AB (ref >60)
GFR, EST NON AFRICAN AMERICAN: 21 mL/min — AB (ref >60)
Glucose, Bld: 232 mg/dL — ABNORMAL HIGH (ref 65–99)
Potassium: 4 mmol/L (ref 3.5–5.1)
SODIUM: 136 mmol/L (ref 135–145)

## 2015-09-26 LAB — BRAIN NATRIURETIC PEPTIDE: B Natriuretic Peptide: 2500.3 pg/mL — ABNORMAL HIGH (ref 0.0–100.0)

## 2015-09-26 NOTE — Progress Notes (Signed)
Patient ID: [name], male   DOB: [birth year], 82 y.o.   MRN: [id number]    Advanced Heart Failure Clinic Note   Referring Physician: Quay Burow, MD Primary Care: Shirline Frees, MD Primary Cardiologist: Quay Burow, MD  HPI:  [name] is a 82 y.o. male with complicated medical history including CAD with CABG 4481, Chronic systolic HF Echo 8/56/31 LVEF 30-35%, Paroxysmal Atrial fibrillation on renally adjusted Eliquis, HLD who has been referred to the HF clinic for further evaluation.   Admitted from clinic 09/08/15 -> 09/12/15 with marked volume overload. Diuresed 21 lbs on IV lasix. Carvedilol also cut back. Lasix switched to torsemide. Echo showed EF down to 20%. He was discharged home with [place]. Discharge weight 181 lbs.   He presents today for post hospital follow up. He is doing very well. More active, able to do most of the things he wants to do. No edema. Weight down another 5 pounds. Taste much improved. No orthopnea or PND.  No bleeding on eliquis and ASA.   Echo 05/18/15 LVEF 30-35%, Grade 3 DD, RV moderately dilated   Past Medical History  Diagnosis Date  . Angina at rest [facility]) 02/27/2011  . CAD (coronary artery disease) 02/27/2011    Catheterized on 02/28/11, graft supplying marginal vessel was occluded, after 48 hours of hydration, a repeat cath was performed in an attempt at PCI 03/02/11 cath had an unsuccessful attempt at PCI to an occluded calcified circumflex vessel, Residual distal tip of the ChoICE PT moderate support wire at the subtotal occlusion. No change in the subtotal occlusion,improved retrograde collateralization  . LV dysfunction 02/27/2011  . CKD (chronic kidney disease) stage 3, GFR 30-59 ml/min 02/27/2011  . LBBB (left bundle branch block) 02/27/2011  . Hypertension   . Hyperlipemia 02/27/2011  . Diabetes mellitus 02/27/2011    insulin dependent  . GERD (gastroesophageal reflux disease)   . CHF (congestive heart failure)  ([place])   . Blood transfusion     no reaction to transfusion per patient  . Arthritis   . Myocardial infarction ([place])   . PVD (peripheral vascular disease) ([place])     Carotid Angiography was performed in 2007, no treatment delivered at that time. Right carotid endarterectomy in 2009, being followed by duplex ultrasound. Doppler remains stable.  [name] CAD (coronary artery disease)     PTCA of the right coronary artery in Feb. 1987, July 1987, and June 1990. Circumflex PTCA in July 1993, followed by a RCA, PTCA in Sept. 1994. CABG in 1994x5 with a LIMA to the LAD, vein graft to the first diagonal, vein graft to the marginal and left circumflex coronary artery, and sequential graft to the PDA and PLA branches of the RCA  . HTN (hypertension)     Renal dopplers have been performed on a semi annual basis. Left renal artery demonstrated vessel narrowing with elevated velocities consistent with a 1-59% diameter reduction.   . carotid doppler     Left ECA : The proximal most aspect of the left external carotid artery demonstrates a peak systolic velocity of 497.0 cm/s. This is consistent with a 70-99% significant stenosis correlated with a 2007 angiogram.  . Paroxysmal atrial fibrillation ([place])   . Deficiency anemia 08/05/2015    Current Outpatient Prescriptions  Medication Sig Dispense Refill  . acetaminophen (TYLENOL) 325 MG tablet Take 650 mg by mouth every 6 (six) hours as needed for pain.    [name] albuterol (PROVENTIL HFA;VENTOLIN HFA) 108 (90 Base)  MCG/ACT inhaler Inhale 2 puffs into the lungs every 6 (six) hours as needed for wheezing or shortness of breath. 1 Inhaler 6  . amLODipine (NORVASC) 5 MG tablet Take 1 tablet (5 mg total) by mouth daily. 90 tablet 2  . apixaban (ELIQUIS) 2.5 MG TABS tablet Take 1 tablet (2.5 mg total) by mouth 2 (two) times daily. 180 tablet 2  . carvedilol (COREG) 6.25 MG tablet Take 1 tablet (6.25 mg total) by mouth 2 (two) times daily with a meal. 60 tablet 6  .  cholecalciferol (VITAMIN D) 1000 UNITS tablet Take 1,000 Units by mouth daily.    . diphenhydrAMINE (BENADRYL) 25 MG tablet Take 25 mg by mouth daily.    [name] glucose monitoring kit (FREESTYLE) monitoring kit 1 each by Does not apply route as needed for other. 1 each 0  . hydrALAZINE (APRESOLINE) 50 MG tablet Take 1 tablet (50 mg total) by mouth 2 (two) times daily. 180 tablet 2  . isosorbide mononitrate (IMDUR) 60 MG 24 hr tablet Take 1 tablet (60 mg total) by mouth daily. 90 tablet 2  . metolazone (ZAROXOLYN) 2.5 MG tablet Take 1 tablet (2.5 mg total) by mouth as needed (5 pound weight gain in 24 hours). 8 tablet 3  . mometasone-formoterol (DULERA) 200-5 MCG/ACT AERO Inhale 2 puffs into the lungs 2 (two) times daily. 1 Inhaler 6  . nitroGLYCERIN (NITROSTAT) 0.4 MG SL tablet Place 0.4 mg under the tongue every 5 (five) minutes as needed for chest pain. Reported on 09/08/2015    . pravastatin (PRAVACHOL) 40 MG tablet Take 1 tablet (40 mg total) by mouth at bedtime. 90 tablet 2  . temazepam (RESTORIL) 30 MG capsule Take 30 mg by mouth at bedtime as needed for sleep.    [name] torsemide (DEMADEX) 20 MG tablet Take 2 tablets (40 mg total) by mouth 2 (two) times daily. 120 tablet 6   No current facility-administered medications for this encounter.    Allergies  Allergen Reactions  . Lisinopril Cough      Social History   Social History  . Marital Status: Married    Spouse Name: N/A  . Number of Children: N/A  . Years of Education: N/A   Occupational History  . Not on file.   Social History Main Topics  . Smoking status: Former Smoker -- 22 years    Quit date: 03/26/1958  . Smokeless tobacco: Never Used     Comment: quit smoking in the 1960's  . Alcohol Use: No  . Drug Use: No  . Sexual Activity: Yes   Other Topics Concern  . Not on file   Social History Narrative      Family History  Problem Relation Age of Onset  . Cancer Mother     unknown to pt  . Cancer Father     pancreas   . Cancer Paternal Grandfather     prostate    Filed Vitals:   09/26/15 1048  BP: 104/62  Pulse: 90  Resp: 18  Weight: 177 lb 4 oz (80.4 kg)  SpO2: 92%   Wt Readings from Last 3 Encounters:  09/26/15 177 lb 4 oz (80.4 kg)  09/12/15 181 lb (82.101 kg)  09/08/15 205 lb 8 oz (93.214 kg)     PHYSICAL EXAM: General:  Elderly appearing. NAD on 02 via [place].  HEENT: Normal, O2 in place Neck: supple. JVP 9 Carotids 2+ bilat; no bruits. No thyromegaly or nodule noted.  Cor: PMI laterally displaced. Irregularly irregular.  No rubs, gallops or murmurs. Lungs: Clear Abdomen: soft, NT, Mild/mod distention, no HSM. No bruits or masses. +BS  Extremities: no cyanosis, clubbing, rash, trace edema Neuro: alert & oriented x 3, cranial nerves grossly intact. moves all 4 extremities w/o difficulty. Affect pleasant.   ASSESSMENT & PLAN:  1. Chronic combined HF : LVEF 30-35% with Grade 3 Diastolic dysfunction - NYHA class II-III symptoms. - Continue torsemide 40 mg BID. Can decrease to 40 daily if weight drops < 165, he becomes lightheaded or develops dark urine.  - Pt continues with [facility]. He is DNR/DNI - He is not a candidate for advanced therapies with his advanced age and co-morbidities.  2. Chronic AF - Rate controlled. On dose adjusted Eliquis 3. CKD stage IV - Improved from 3.0 -> 2.5 with diuresis on recent admission. - BMET today.  4. CAD s/p CABG 1994 - No CP currently.  On ASA and eliquis 5. HTN - Soft but stable.    Shirley Friar, PA-C  Patient seen and examined with Oda Kilts, PA-C. We discussed all aspects of the encounter. I agree with the assessment and plan as stated above.   He is much improved after IV diuresis. Now NYHA II-III. We talked extensively about how to handle his diuretic regimen (see above), Will continue current HF regimen for now (including reduced-dose carvedilol). Recheck labs today. Continue Eliquis for chronic AF. We discussed  the fact that if he continues to improve may actually no longer need/qualify for hospice services. Encouraged him to remain active.   Twilla Khouri,MD 10:56 PM   09/26/2015

## 2015-09-26 NOTE — Patient Instructions (Signed)
Labs today  Your physician recommends that you schedule a follow-up appointment in: 6 weeks  

## 2015-09-29 ENCOUNTER — Ambulatory Visit: Payer: Medicare Other | Admitting: Pulmonary Disease

## 2015-10-05 ENCOUNTER — Ambulatory Visit: Payer: Medicare Other

## 2015-10-05 ENCOUNTER — Other Ambulatory Visit: Payer: Medicare Other

## 2015-10-14 ENCOUNTER — Telehealth: Payer: Self-pay | Admitting: *Deleted

## 2015-10-14 NOTE — Telephone Encounter (Signed)
Hospice orders dated 10-10-15 to 10-10-15 signed by Dr Allyson SabalBerry and faxed to number provided.

## 2015-10-18 ENCOUNTER — Telehealth (HOSPITAL_COMMUNITY): Payer: Self-pay

## 2015-10-18 NOTE — Telephone Encounter (Signed)
Patient's spouse calling c.o patient having N/V, abd distention, decreased appetite increasing since last apt 3 weeks ago. Also more SOB requiring neb treatments as well. Per hospice RN, was advised to call our office to get sooner f/u apt (scheduled 11/11/15). Weight stable  At 168-169 lbs, no swelling noted in legs. Per Mardelle Matte, seems more GI in nature than heart failure. Apt moved to 10/27/15 with Dr. Gala Romney. Advised if s/s worsen to call our office or seek emergency medical services. Aware and agreeable to plan as stated above.  Ave Filter

## 2015-10-19 ENCOUNTER — Other Ambulatory Visit: Payer: Medicare Other

## 2015-10-19 ENCOUNTER — Ambulatory Visit: Payer: Medicare Other

## 2015-10-21 ENCOUNTER — Telehealth: Payer: Self-pay | Admitting: *Deleted

## 2015-10-21 NOTE — Telephone Encounter (Signed)
Hospice Supplemental Orders from 10-18-15 to 10-18-15, signed by Dr Allyson Sabal and faxed to number provided.

## 2015-10-27 ENCOUNTER — Encounter (HOSPITAL_COMMUNITY): Payer: Self-pay | Admitting: Internal Medicine

## 2015-10-27 ENCOUNTER — Ambulatory Visit (HOSPITAL_COMMUNITY)
Admission: RE | Admit: 2015-10-27 | Discharge: 2015-10-27 | Disposition: A | Discharge status: Home / Self Care | Source: Ambulatory Visit | Attending: Internal Medicine | Admitting: Internal Medicine

## 2015-10-27 VITALS — BP 100/58 | HR 97 | Wt 173.5 lb

## 2015-10-27 DIAGNOSIS — I429 Cardiomyopathy, unspecified: Secondary | ICD-10-CM | POA: Diagnosis not present

## 2015-10-27 DIAGNOSIS — I509 Heart failure, unspecified: Secondary | ICD-10-CM | POA: Diagnosis not present

## 2015-10-27 LAB — BASIC METABOLIC PANEL
ANION GAP: 13 (ref 5–15)
BUN: 81 mg/dL — ABNORMAL HIGH (ref 6–20)
CO2: 25 mmol/L (ref 22–32)
Calcium: 9.2 mg/dL (ref 8.9–10.3)
Chloride: 99 mmol/L — ABNORMAL LOW (ref 101–111)
Creatinine, Ser: 2.75 mg/dL — ABNORMAL HIGH (ref 0.61–1.24)
GFR calc Af Amer: 23 mL/min — ABNORMAL LOW (ref >60)
GFR, EST NON AFRICAN AMERICAN: 20 mL/min — AB (ref >60)
GLUCOSE: 194 mg/dL — AB (ref 65–99)
POTASSIUM: 3.4 mmol/L — AB (ref 3.5–5.1)
Sodium: 137 mmol/L (ref 135–145)

## 2015-10-27 LAB — CBC
HEMATOCRIT: 34 % — AB (ref 39.0–52.0)
HEMOGLOBIN: 11 g/dL — AB (ref 13.0–17.0)
MCH: 31.9 pg (ref 26.0–34.0)
MCHC: 32.4 g/dL (ref 30.0–36.0)
MCV: 98.6 fL (ref 78.0–100.0)
PLATELETS: 136 10*3/uL — AB (ref 150–400)
RBC: 3.45 MIL/uL — AB (ref 4.22–5.81)
RDW: 16 % — ABNORMAL HIGH (ref 11.5–15.5)
WBC: 4.8 10*3/uL (ref 4.0–10.5)

## 2015-10-27 LAB — BRAIN NATRIURETIC PEPTIDE: B Natriuretic Peptide: 3644.3 pg/mL — ABNORMAL HIGH (ref 0.0–100.0)

## 2015-10-27 MED ORDER — GABAPENTIN 100 MG PO CAPS: 200.0000 mg | ORAL_CAPSULE | Freq: Every day | ORAL | 3 refills | Status: DC

## 2015-10-27 NOTE — Patient Instructions (Signed)
Start Gabapentin 200mg  (2 tablets) daily at bedtime.  Routine lab work today. Will notify you of abnormal results  Follow up with Dr.Bensimhon in 4-6 weeks

## 2015-10-27 NOTE — Progress Notes (Signed)
Patient ID: [name], male   DOB: [birth year], 82 y.o.   MRN: [id number] Patient ID: [name], male   DOB: 12-06-1932, 82 y.o.   MRN: [id number]    Advanced Heart Failure Clinic Note   Referring Physician: Quay Burow, MD Primary Care: Shirline Frees, MD Primary Cardiologist: Quay Burow, MD  HPI:  [name] is a 82 y.o. male with complicated medical history including CAD with CABG 5956, Chronic systolic HF Echo 3/87/56 LVEF 30-35%, Paroxysmal Atrial fibrillation on renally adjusted Eliquis, HLD who has been referred to the HF clinic for further evaluation.   Admitted from clinic 09/08/15 -> 09/12/15 with marked volume overload. Diuresed 21 lbs on IV lasix. Carvedilol also cut back. Lasix switched to torsemide. Echo showed EF down to 20%. He was discharged home with [place]. Discharge weight 181 lbs.   We saw him last month and was doing better. NYHA II-III  He presents today for follow up. Unfortunately. Not feeling very. Has more fatigue, nausea, poor appetite and severe leg pain and tingling at night. More SOB using nebulizer in afternoons. Denies wheezing. + thick brown mucus. No f/c. Weight is stable but belly swelling.  168-169 pounds. Still being followed by Hospice.   Echo 05/18/15 LVEF 30-35%, Grade 3 DD, RV moderately dilated   Past Medical History:  Diagnosis Date  . Angina at rest [facility]) 02/27/2011  . Arthritis   . Blood transfusion    no reaction to transfusion per patient  . CAD (coronary artery disease) 02/27/2011   Catheterized on 02/28/11, graft supplying marginal vessel was occluded, after 48 hours of hydration, a repeat cath was performed in an attempt at PCI 03/02/11 cath had an unsuccessful attempt at PCI to an occluded calcified circumflex vessel, Residual distal tip of the ChoICE PT moderate support wire at the subtotal occlusion. No change in the subtotal occlusion,improved retrograde collateralization  . CAD (coronary  artery disease)    PTCA of the right coronary artery in Feb. 1987, July 1987, and June 1990. Circumflex PTCA in July 1993, followed by a RCA, PTCA in Sept. 1994. CABG in 1994x5 with a LIMA to the LAD, vein graft to the first diagonal, vein graft to the marginal and left circumflex coronary artery, and sequential graft to the PDA and PLA branches of the RCA  . carotid doppler    Left ECA : The proximal most aspect of the left external carotid artery demonstrates a peak systolic velocity of 433.2 cm/s. This is consistent with a 70-99% significant stenosis correlated with a 2007 angiogram.  . CHF (congestive heart failure) ([place])   . CKD (chronic kidney disease) stage 3, GFR 30-59 ml/min 02/27/2011  . Deficiency anemia 08/05/2015  . Diabetes mellitus 02/27/2011   insulin dependent  . GERD (gastroesophageal reflux disease)   . HTN (hypertension)    Renal dopplers have been performed on a semi annual basis. Left renal artery demonstrated vessel narrowing with elevated velocities consistent with a 1-59% diameter reduction.   . Hyperlipemia 02/27/2011  . Hypertension   . LBBB (left bundle branch block) 02/27/2011  . LV dysfunction 02/27/2011  . Myocardial infarction ([place])   . Paroxysmal atrial fibrillation ([facility])   . PVD (peripheral vascular disease) ([place])    Carotid Angiography was performed in 2007, no treatment delivered at that time. Right carotid endarterectomy in 2009, being followed by duplex ultrasound. Doppler remains stable.    Current Outpatient Prescriptions  Medication Sig Dispense Refill  . acetaminophen (TYLENOL) 325  MG tablet Take 650 mg by mouth every 6 (six) hours as needed for pain.    [name] albuterol (PROVENTIL HFA;VENTOLIN HFA) 108 (90 Base) MCG/ACT inhaler Inhale 2 puffs into the lungs every 6 (six) hours as needed for wheezing or shortness of breath. 1 Inhaler 6  . amLODipine (NORVASC) 5 MG tablet Take 1 tablet (5 mg total) by mouth daily. 90 tablet 2  . apixaban (ELIQUIS) 2.5 MG TABS  tablet Take 1 tablet (2.5 mg total) by mouth 2 (two) times daily. 180 tablet 2  . carvedilol (COREG) 6.25 MG tablet Take 1 tablet (6.25 mg total) by mouth 2 (two) times daily with a meal. 60 tablet 6  . cholecalciferol (VITAMIN D) 1000 UNITS tablet Take 1,000 Units by mouth daily.    . diphenhydrAMINE (BENADRYL) 25 MG tablet Take 25 mg by mouth daily.    [name] glucose monitoring kit (FREESTYLE) monitoring kit 1 each by Does not apply route as needed for other. 1 each 0  . hydrALAZINE (APRESOLINE) 50 MG tablet Take 1 tablet (50 mg total) by mouth 2 (two) times daily. 180 tablet 2  . isosorbide mononitrate (IMDUR) 60 MG 24 hr tablet Take 1 tablet (60 mg total) by mouth daily. 90 tablet 2  . mometasone-formoterol (DULERA) 200-5 MCG/ACT AERO Inhale 2 puffs into the lungs 2 (two) times daily. 1 Inhaler 6  . pravastatin (PRAVACHOL) 40 MG tablet Take 1 tablet (40 mg total) by mouth at bedtime. 90 tablet 2  . temazepam (RESTORIL) 30 MG capsule Take 30 mg by mouth at bedtime as needed for sleep.    [name] torsemide (DEMADEX) 20 MG tablet Take 2 tablets (40 mg total) by mouth 2 (two) times daily. 120 tablet 6  . metolazone (ZAROXOLYN) 2.5 MG tablet Take 1 tablet (2.5 mg total) by mouth as needed (5 pound weight gain in 24 hours). (Patient not taking: Reported on 10/27/2015) 8 tablet 3  . nitroGLYCERIN (NITROSTAT) 0.4 MG SL tablet Place 0.4 mg under the tongue every 5 (five) minutes as needed for chest pain. Reported on 09/08/2015     No current facility-administered medications for this encounter.     Allergies  Allergen Reactions  . Lisinopril Cough      Social History   Social History  . Marital status: Married    Spouse name: N/A  . Number of children: N/A  . Years of education: N/A   Occupational History  . Not on file.   Social History Main Topics  . Smoking status: Former Smoker    Years: 22.00    Quit date: 03/26/1958  . Smokeless tobacco: Never Used     Comment: quit smoking in the 1960's  .  Alcohol use No  . Drug use: No  . Sexual activity: Yes   Other Topics Concern  . Not on file   Social History Narrative  . No narrative on file      Family History  Problem Relation Age of Onset  . Cancer Mother     unknown to pt  . Cancer Father     pancreas  . Cancer Paternal Grandfather     prostate    Vitals:   10/27/15 1034  BP: (!) 100/58  Pulse: 97  SpO2: 98%  Weight: 173 lb 8 oz (78.7 kg)   Wt Readings from Last 3 Encounters:  10/27/15 173 lb 8 oz (78.7 kg)  09/26/15 177 lb 4 oz (80.4 kg)  09/12/15 181 lb (82.1 kg)  PHYSICAL EXAM: General:  Elderly appearing. NAD on 02 via [place].  HEENT: Normal, O2 in place Neck: supple. JVP 6-7 Carotids 2+ bilat; no bruits. No thyromegaly or nodule noted.  Cor: PMI laterally displaced. Irregularly irregular. No rubs, gallops or murmurs. Lungs: Clear Abdomen: soft, NT, no distention, no HSM. No bruits or masses. +BS  Extremities: no cyanosis, clubbing, rash, no edema Neuro: alert & oriented x 3, cranial nerves grossly intact. moves all 4 extremities w/o difficulty. Affect pleasant.   ASSESSMENT & PLAN:  1. Chronic combined HF : LVEF 30-35% with Grade 3 Diastolic dysfunction - Feels worse today. NYHA class III-IIIB symptoms. Volume status ok though.  - Continue torsemide 40 mg BID.  - Not much else to offer him at this point. With low BP will stop amlodipine and may have to cut back carvedilol at next visit - Pt continues with [facility]. He is DNR/DNI - He is not a candidate for advanced therapies with his advanced age and co-morbidities.  2. Chronic AF - Rate controlled. On dose adjusted Eliquis 3. CKD stage IV - Improved from 3.0 -> 2.5 with diuresis on recent admission. Will recheck today - BMET today.  4. CAD s/p CABG 1994 - No CP currently.  On ASA and eliquis 5. HTN - BP low. Stop amlodipine 6. Neuropathy --start neurontin  Glori Bickers, MD

## 2015-11-01 ENCOUNTER — Telehealth (HOSPITAL_COMMUNITY): Payer: Self-pay | Admitting: Cardiology

## 2015-11-01 NOTE — Telephone Encounter (Signed)
Patient aware. Patient aware via patients wife. Reports patient is currently taking 20 meq daily. Will take an additional 40 (2 tabs) today. Patients wife reports weight is increased from 168 to 173 today. Increased SOB and mild abdominal swelling, wife reports sob has been a struggle overnight. As written in Dr Gala RomneyBensimhon note patient should take a dose of metolazone, advised to call back if weight does not come down

## 2015-11-01 NOTE — Telephone Encounter (Signed)
-----   Message from Dolores Pattyaniel R Bensimhon, MD sent at 10/29/2015 11:26 PM EDT ----- BNP up but didn't look overload in clinic. Will follow. Have him take kcl 40 x 1

## 2015-11-02 ENCOUNTER — Other Ambulatory Visit: Payer: Medicare Other

## 2015-11-03 ENCOUNTER — Ambulatory Visit: Payer: Medicare Other | Admitting: Hematology and Oncology

## 2015-11-03 ENCOUNTER — Other Ambulatory Visit: Payer: Medicare Other

## 2015-11-03 ENCOUNTER — Ambulatory Visit: Payer: Medicare Other

## 2015-11-07 ENCOUNTER — Telehealth (HOSPITAL_COMMUNITY): Payer: Self-pay | Admitting: Cardiology

## 2015-11-07 NOTE — Telephone Encounter (Signed)
Patients wife aware and voiced understating

## 2015-11-07 NOTE — Telephone Encounter (Signed)
Can hold tomorrow as well.        He is hospice so may be part of gradual decline.       Can likely hold prn for weights < 160 lbs.  They should check his BP.   Will also forward to Dr. Kevan RosebushBensimhon    Katasha Riga "[address]Andy" [place], [place]PA-C 11/07/2015 4:05 PM

## 2015-11-07 NOTE — Telephone Encounter (Signed)
Patients wife called with concerns about decreased weight Weight was 168 last week, bumped to 174, advsied to take metolazone x 2 days, weight today 156  Patient does have increased weakness, wife reports he is more alert than normal. Unable to check b/p today  Wife will hold PM torsemide to day  Please advise further

## 2015-11-08 ENCOUNTER — Telehealth (HOSPITAL_COMMUNITY): Payer: Self-pay

## 2015-11-08 ENCOUNTER — Telehealth: Payer: Self-pay | Admitting: *Deleted

## 2015-11-08 MED ORDER — CARVEDILOL 3.125 MG PO TABS: 3.1250 mg | ORAL_TABLET | Freq: Two times a day (BID) | ORAL | 6 refills | Status: AC

## 2015-11-08 NOTE — Telephone Encounter (Signed)
Megan [facility] called to report patient having low BPs. This morning patient was 100/52 before any am medications.  RN held am hydralazine and would liek to know if any medication changes should be made or parameters should be in place to hold medications prn. Per Otilio SaberAndy Tillery PA-C, advised to cut back on Carvedilol (coreg) to 3.125 mg twice daily. Aware and agreeable and verbalized confirmation of plan of care and med changes with RN via phone.  Ave FilterBradley, Megan Genevea, RN

## 2015-11-08 NOTE — Telephone Encounter (Signed)
[facility] orders for plan of care from 11-08-15 to 02-05-16 signed by Dr Allyson SabalBerry and faxed to number provided.

## 2015-11-11 ENCOUNTER — Telehealth (HOSPITAL_COMMUNITY): Payer: Self-pay | Admitting: *Deleted

## 2015-11-11 ENCOUNTER — Encounter (HOSPITAL_COMMUNITY): Admitting: Internal Medicine

## 2015-11-11 NOTE — Telephone Encounter (Signed)
Megan with Hospice called and stared since medication changes (see note below) the highest patients bp has been is 100/70. She wants to know if she should d/c IMDUR as well. Please advise

## 2015-11-14 ENCOUNTER — Telehealth (HOSPITAL_COMMUNITY): Payer: Self-pay

## 2015-11-14 ENCOUNTER — Telehealth: Payer: Self-pay | Admitting: Cardiovascular Disease

## 2015-11-14 NOTE — Telephone Encounter (Signed)
The hospice nurse has already spoke with the CHF clinic. No changes made. Will make dr berry aware.

## 2015-11-14 NOTE — Telephone Encounter (Signed)
**  edit**Megan with Hospice called and stated since medication changes (see note below) the highest patients bp has been is 100/70. She wants to know if she should d/c IMDUR as well. Please advise  "Megan RN with [facility] called to report patient having low BPs. This morning patient was 100/52 before any am medications.  RN held am hydralazine and would liek to know if any medication changes should be made or parameters should be in place to hold medications prn. Per Otilio SaberAndy Tillery PA-C, advised to cut back on Carvedilol (coreg) to 3.125 mg twice daily. Aware and agreeable and verbalized confirmation of plan of care and med changes with RN via phone."

## 2015-11-14 NOTE — Telephone Encounter (Signed)
[name] with [name] called to report wife requested to DC imdur. BP now running 100/70. Also fell during the night and was down about 2 hours before wife found patient. No complaints from patient. CHF providers made aware, no changes at this time. Will DC imdur for patient;'s med list.  [name], [name] Wildes Genevea, RN

## 2015-11-14 NOTE — Telephone Encounter (Signed)
New message     [name] MilletMegan is calling from hospice for rn, he has a fall and he was on the floor for 2 hours before wife found him  [name] has been managing bp  Wife stopped Imdur 60mg  1x day on 11/11/15 and today pt BP was 100/70

## 2015-11-16 ENCOUNTER — Other Ambulatory Visit (HOSPITAL_COMMUNITY): Payer: Self-pay

## 2015-11-16 ENCOUNTER — Telehealth (HOSPITAL_COMMUNITY): Payer: Self-pay

## 2015-11-16 MED ORDER — GABAPENTIN 100 MG PO CAPS: 200.0000 mg | ORAL_CAPSULE | Freq: Two times a day (BID) | ORAL | 3 refills | Status: AC

## 2015-11-16 NOTE — Telephone Encounter (Signed)
Returned call to Herr[place]egan with Hospice. Per Otilio SaberAndy Tillery PA-C advised can increase patient's gabapentin to 200 mg twice daily for restless leg/neuropathy. Aundra MilletMegan also voiced concerns of patient's SBP staying in 80's-90's and asked if we could put parameter on coreg or hold/DC it as patient stays very dizzy with more frequent falls past 1-2 weeks. Wife has already stopped imdur and hydralazine and frequently holds pm dose of torsemide without much help in symptom management. Will forward back to provider to get input on this.  Ave FilterBradley, Megan Genevea, RN

## 2015-11-16 NOTE — Telephone Encounter (Signed)
Maintain Coreg for now as tolerated.     Hold for BP < 85.   Make sure he has stopped amlodipine.   Casimiro NeedleMichael [address]"Andy" [place], PA-C 11/16/2015 4:59 PM

## 2015-11-16 NOTE — Telephone Encounter (Signed)
Ok to increase to BID.   For best results for restless legs should continue evening dose, and add late afternoon dose.     [name] NeedleMichael [address][name]" [place], [place]PA-C 11/16/2015 4:31 PM

## 2015-11-16 NOTE — Telephone Encounter (Signed)
RN with Hospice asking for order to increase gabapentin dose for neuropathy/restless legs. Will forward to providers involved in patient's care to see if this is ok.  Ave FilterBradley, Khushbu Pippen Genevea, RN

## 2015-11-17 ENCOUNTER — Telehealth: Payer: Self-pay | Admitting: *Deleted

## 2015-11-17 NOTE — Telephone Encounter (Signed)
Supplementall orders from 11-02-15 to 11-02-15 and from 11/08/15 to 11-08-15, signed by Dr. Allyson SabalBerry and [facility].

## 2015-11-18 ENCOUNTER — Telehealth (HOSPITAL_COMMUNITY): Payer: Self-pay | Admitting: *Deleted

## 2015-11-18 NOTE — Telephone Encounter (Signed)
Megan with Hospice called to let us know that pt BP was 94/65 this am. Pt wife is holding Coreg 3.125 today. Highest BP this week has been 100/70. Aundra MilletMegan says that this is a complete change in pt status. He has no energy and has developed shaking since. Pt fell on Monday.

## 2015-11-21 ENCOUNTER — Emergency Department (HOSPITAL_COMMUNITY)

## 2015-11-21 ENCOUNTER — Inpatient Hospital Stay (HOSPITAL_COMMUNITY)
Admission: EM | Admit: 2015-11-21 | Discharge: 2015-11-25 | DRG: 291 | Disposition: E | Attending: Internal Medicine | Admitting: Internal Medicine

## 2015-11-21 ENCOUNTER — Telehealth (HOSPITAL_COMMUNITY): Payer: Self-pay | Admitting: Cardiology

## 2015-11-21 ENCOUNTER — Encounter (HOSPITAL_COMMUNITY): Payer: Self-pay

## 2015-11-21 DIAGNOSIS — J449 Chronic obstructive pulmonary disease, unspecified: Secondary | ICD-10-CM | POA: Diagnosis present

## 2015-11-21 DIAGNOSIS — I5023 Acute on chronic systolic (congestive) heart failure: Secondary | ICD-10-CM | POA: Diagnosis present

## 2015-11-21 DIAGNOSIS — I482 Chronic atrial fibrillation: Secondary | ICD-10-CM | POA: Diagnosis present

## 2015-11-21 DIAGNOSIS — I2510 Atherosclerotic heart disease of native coronary artery without angina pectoris: Secondary | ICD-10-CM | POA: Diagnosis present

## 2015-11-21 DIAGNOSIS — Z515 Encounter for palliative care: Secondary | ICD-10-CM | POA: Diagnosis not present

## 2015-11-21 DIAGNOSIS — K219 Gastro-esophageal reflux disease without esophagitis: Secondary | ICD-10-CM | POA: Diagnosis present

## 2015-11-21 DIAGNOSIS — R1110 Vomiting, unspecified: Secondary | ICD-10-CM | POA: Diagnosis present

## 2015-11-21 DIAGNOSIS — R0602 Shortness of breath: Secondary | ICD-10-CM

## 2015-11-21 DIAGNOSIS — E875 Hyperkalemia: Secondary | ICD-10-CM | POA: Diagnosis present

## 2015-11-21 DIAGNOSIS — Z79899 Other long term (current) drug therapy: Secondary | ICD-10-CM

## 2015-11-21 DIAGNOSIS — Z951 Presence of aortocoronary bypass graft: Secondary | ICD-10-CM | POA: Diagnosis not present

## 2015-11-21 DIAGNOSIS — I959 Hypotension, unspecified: Secondary | ICD-10-CM | POA: Diagnosis present

## 2015-11-21 DIAGNOSIS — K7200 Acute and subacute hepatic failure without coma: Secondary | ICD-10-CM | POA: Diagnosis present

## 2015-11-21 DIAGNOSIS — N186 End stage renal disease: Secondary | ICD-10-CM | POA: Diagnosis present

## 2015-11-21 DIAGNOSIS — N179 Acute kidney failure, unspecified: Secondary | ICD-10-CM | POA: Diagnosis not present

## 2015-11-21 DIAGNOSIS — E1122 Type 2 diabetes mellitus with diabetic chronic kidney disease: Secondary | ICD-10-CM | POA: Diagnosis present

## 2015-11-21 DIAGNOSIS — Z7901 Long term (current) use of anticoagulants: Secondary | ICD-10-CM

## 2015-11-21 DIAGNOSIS — Z981 Arthrodesis status: Secondary | ICD-10-CM

## 2015-11-21 DIAGNOSIS — R451 Restlessness and agitation: Secondary | ICD-10-CM

## 2015-11-21 DIAGNOSIS — I132 Hypertensive heart and chronic kidney disease with heart failure and with stage 5 chronic kidney disease, or end stage renal disease: Secondary | ICD-10-CM | POA: Diagnosis present

## 2015-11-21 DIAGNOSIS — Z87891 Personal history of nicotine dependence: Secondary | ICD-10-CM

## 2015-11-21 DIAGNOSIS — I480 Paroxysmal atrial fibrillation: Secondary | ICD-10-CM | POA: Diagnosis present

## 2015-11-21 DIAGNOSIS — Z955 Presence of coronary angioplasty implant and graft: Secondary | ICD-10-CM | POA: Diagnosis not present

## 2015-11-21 DIAGNOSIS — Z66 Do not resuscitate: Secondary | ICD-10-CM | POA: Diagnosis present

## 2015-11-21 DIAGNOSIS — R740 Nonspecific elevation of levels of transaminase and lactic acid dehydrogenase [LDH]: Secondary | ICD-10-CM | POA: Diagnosis present

## 2015-11-21 DIAGNOSIS — E1140 Type 2 diabetes mellitus with diabetic neuropathy, unspecified: Secondary | ICD-10-CM | POA: Diagnosis present

## 2015-11-21 DIAGNOSIS — D631 Anemia in chronic kidney disease: Secondary | ICD-10-CM | POA: Diagnosis present

## 2015-11-21 DIAGNOSIS — Z789 Other specified health status: Secondary | ICD-10-CM | POA: Diagnosis not present

## 2015-11-21 DIAGNOSIS — E785 Hyperlipidemia, unspecified: Secondary | ICD-10-CM | POA: Diagnosis present

## 2015-11-21 DIAGNOSIS — I252 Old myocardial infarction: Secondary | ICD-10-CM | POA: Diagnosis not present

## 2015-11-21 DIAGNOSIS — K7290 Hepatic failure, unspecified without coma: Secondary | ICD-10-CM | POA: Diagnosis not present

## 2015-11-21 DIAGNOSIS — E1151 Type 2 diabetes mellitus with diabetic peripheral angiopathy without gangrene: Secondary | ICD-10-CM | POA: Diagnosis present

## 2015-11-21 DIAGNOSIS — Z888 Allergy status to other drugs, medicaments and biological substances status: Secondary | ICD-10-CM

## 2015-11-21 DIAGNOSIS — Z9981 Dependence on supplemental oxygen: Secondary | ICD-10-CM

## 2015-11-21 HISTORY — DX: Dependence on supplemental oxygen: Z99.81

## 2015-11-21 LAB — COMPREHENSIVE METABOLIC PANEL
ALBUMIN: 3.3 g/dL — AB (ref 3.5–5.0)
ALT: 65 U/L — ABNORMAL HIGH (ref 17–63)
AST: 100 U/L — ABNORMAL HIGH (ref 15–41)
Alkaline Phosphatase: 118 U/L (ref 38–126)
Anion gap: 14 (ref 5–15)
BILIRUBIN TOTAL: 2.1 mg/dL — AB (ref 0.3–1.2)
BUN: 128 mg/dL — AB (ref 6–20)
CALCIUM: 9.1 mg/dL (ref 8.9–10.3)
CHLORIDE: 99 mmol/L — AB (ref 101–111)
CO2: 20 mmol/L — AB (ref 22–32)
CREATININE: 3.17 mg/dL — AB (ref 0.61–1.24)
GFR, EST AFRICAN AMERICAN: 20 mL/min — AB (ref >60)
GFR, EST NON AFRICAN AMERICAN: 17 mL/min — AB (ref >60)
Glucose, Bld: 227 mg/dL — ABNORMAL HIGH (ref 65–99)
Potassium: 4.5 mmol/L (ref 3.5–5.1)
SODIUM: 133 mmol/L — AB (ref 135–145)
Total Protein: 6.2 g/dL — ABNORMAL LOW (ref 6.5–8.1)

## 2015-11-21 LAB — AMMONIA: AMMONIA: 63 umol/L — AB (ref 9–35)

## 2015-11-21 LAB — CBC WITH DIFFERENTIAL/PLATELET
BASOS PCT: 0 %
Basophils Absolute: 0 10*3/uL (ref 0.0–0.1)
EOS ABS: 0.1 10*3/uL (ref 0.0–0.7)
EOS PCT: 1 %
HCT: 39 % (ref 39.0–52.0)
Hemoglobin: 12.7 g/dL — ABNORMAL LOW (ref 13.0–17.0)
Lymphocytes Relative: 9 %
Lymphs Abs: 0.6 10*3/uL — ABNORMAL LOW (ref 0.7–4.0)
MCH: 31.8 pg (ref 26.0–34.0)
MCHC: 32.6 g/dL (ref 30.0–36.0)
MCV: 97.7 fL (ref 78.0–100.0)
MONO ABS: 0.6 10*3/uL (ref 0.1–1.0)
MONOS PCT: 9 %
Neutro Abs: 5.6 10*3/uL (ref 1.7–7.7)
Neutrophils Relative %: 81 %
PLATELETS: 176 10*3/uL (ref 150–400)
RBC: 3.99 MIL/uL — ABNORMAL LOW (ref 4.22–5.81)
RDW: 17.4 % — AB (ref 11.5–15.5)
WBC: 6.9 10*3/uL (ref 4.0–10.5)

## 2015-11-21 LAB — I-STAT TROPONIN, ED: TROPONIN I, POC: 0.06 ng/mL (ref 0.00–0.08)

## 2015-11-21 LAB — GLUCOSE, CAPILLARY: GLUCOSE-CAPILLARY: 230 mg/dL — AB (ref 65–99)

## 2015-11-21 LAB — PROTIME-INR
INR: 3.44
Prothrombin Time: 35.5 seconds — ABNORMAL HIGH (ref 11.4–15.2)

## 2015-11-21 LAB — MAGNESIUM: Magnesium: 2 mg/dL (ref 1.7–2.4)

## 2015-11-21 LAB — BRAIN NATRIURETIC PEPTIDE: B NATRIURETIC PEPTIDE 5: 2605.8 pg/mL — AB (ref 0.0–100.0)

## 2015-11-21 MED ORDER — NITROGLYCERIN 0.4 MG SL SUBL: 0.4000 mg | SUBLINGUAL_TABLET | SUBLINGUAL | Status: DC | PRN

## 2015-11-21 MED ORDER — SODIUM CHLORIDE 0.9 % IV SOLN: 250.0000 mL | INTRAVENOUS | Status: DC | PRN

## 2015-11-21 MED ORDER — FUROSEMIDE 10 MG/ML IJ SOLN
80.0000 mg | Freq: Two times a day (BID) | INTRAMUSCULAR | Status: DC
  Administered 2015-11-22 – 2015-11-23 (×4): 80 mg via INTRAVENOUS
  Filled 2015-11-21 (×3): qty 8

## 2015-11-21 MED ORDER — FUROSEMIDE 10 MG/ML IJ SOLN
60.0000 mg | Freq: Once | INTRAMUSCULAR | Status: AC
  Administered 2015-11-21: 60 mg via INTRAVENOUS
  Filled 2015-11-21: qty 6

## 2015-11-21 MED ORDER — INSULIN ASPART 100 UNIT/ML ~~LOC~~ SOLN
0.0000 [IU] | Freq: Three times a day (TID) | SUBCUTANEOUS | Status: DC
Start: 2015-11-22 — End: 2015-11-23
  Administered 2015-11-22 – 2015-11-23 (×3): 2 [IU] via SUBCUTANEOUS

## 2015-11-21 MED ORDER — ACETAMINOPHEN 325 MG PO TABS: 650.0000 mg | ORAL_TABLET | ORAL | Status: DC | PRN

## 2015-11-21 MED ORDER — DIPHENHYDRAMINE HCL 25 MG PO CAPS
25.0000 mg | ORAL_CAPSULE | Freq: Every evening | ORAL | Status: DC | PRN
Start: 2015-11-21 — End: 2015-11-23
  Administered 2015-11-22 – 2015-11-23 (×2): 25 mg via ORAL
  Filled 2015-11-21 (×5): qty 1

## 2015-11-21 MED ORDER — TEMAZEPAM 15 MG PO CAPS: 30.0000 mg | ORAL_CAPSULE | Freq: Every evening | ORAL | Status: DC | PRN

## 2015-11-21 MED ORDER — APIXABAN 2.5 MG PO TABS
2.5000 mg | ORAL_TABLET | Freq: Two times a day (BID) | ORAL | Status: DC
  Administered 2015-11-21 – 2015-11-22 (×3): 2.5 mg via ORAL
  Filled 2015-11-21 (×3): qty 1

## 2015-11-21 MED ORDER — INSULIN ASPART 100 UNIT/ML ~~LOC~~ SOLN
0.0000 [IU] | Freq: Every day | SUBCUTANEOUS | Status: DC
  Administered 2015-11-21: 2 [IU] via SUBCUTANEOUS

## 2015-11-21 MED ORDER — PANTOPRAZOLE SODIUM 40 MG PO TBEC
40.0000 mg | DELAYED_RELEASE_TABLET | Freq: Every day | ORAL | Status: DC
  Administered 2015-11-22: 40 mg via ORAL
  Filled 2015-11-21: qty 1

## 2015-11-21 MED ORDER — SODIUM CHLORIDE 0.9% FLUSH
3.0000 mL | Freq: Two times a day (BID) | INTRAVENOUS | Status: DC
  Administered 2015-11-21 – 2015-11-23 (×3): 3 mL via INTRAVENOUS

## 2015-11-21 MED ORDER — ONDANSETRON HCL 4 MG/2ML IJ SOLN
4.0000 mg | Freq: Four times a day (QID) | INTRAMUSCULAR | Status: DC | PRN
  Filled 2015-11-21: qty 2

## 2015-11-21 MED ORDER — AMIODARONE HCL 200 MG PO TABS
200.0000 mg | ORAL_TABLET | Freq: Two times a day (BID) | ORAL | Status: DC
  Administered 2015-11-21 – 2015-11-22 (×4): 200 mg via ORAL
  Filled 2015-11-21 (×4): qty 1

## 2015-11-21 MED ORDER — ONDANSETRON HCL 4 MG/2ML IJ SOLN
4.0000 mg | Freq: Once | INTRAMUSCULAR | Status: AC
  Administered 2015-11-21: 4 mg via INTRAVENOUS
  Filled 2015-11-21: qty 2

## 2015-11-21 MED ORDER — MOMETASONE FURO-FORMOTEROL FUM 200-5 MCG/ACT IN AERO
2.0000 | INHALATION_SPRAY | Freq: Two times a day (BID) | RESPIRATORY_TRACT | Status: DC
  Administered 2015-11-21 – 2015-11-22 (×3): 2 via RESPIRATORY_TRACT
  Filled 2015-11-21: qty 8.8

## 2015-11-21 MED ORDER — HYDROCODONE-ACETAMINOPHEN 5-325 MG PO TABS: 1.0000 | ORAL_TABLET | Freq: Four times a day (QID) | ORAL | Status: DC | PRN

## 2015-11-21 MED ORDER — SODIUM CHLORIDE 0.9% FLUSH: 3.0000 mL | INTRAVENOUS | Status: DC | PRN

## 2015-11-21 MED ORDER — GABAPENTIN 100 MG PO CAPS
200.0000 mg | ORAL_CAPSULE | Freq: Two times a day (BID) | ORAL | Status: DC
  Administered 2015-11-21 – 2015-11-22 (×3): 200 mg via ORAL
  Filled 2015-11-21 (×4): qty 2

## 2015-11-21 MED ORDER — ALBUTEROL SULFATE (2.5 MG/3ML) 0.083% IN NEBU: 2.5000 mg | INHALATION_SOLUTION | Freq: Four times a day (QID) | RESPIRATORY_TRACT | Status: DC | PRN

## 2015-11-21 MED ORDER — POTASSIUM CHLORIDE CRYS ER 20 MEQ PO TBCR
20.0000 meq | EXTENDED_RELEASE_TABLET | Freq: Two times a day (BID) | ORAL | Status: DC
  Administered 2015-11-21: 20 meq via ORAL
  Filled 2015-11-21: qty 1

## 2015-11-21 MED ORDER — LACTULOSE 10 GM/15ML PO SOLN
30.0000 g | Freq: Two times a day (BID) | ORAL | Status: DC
  Administered 2015-11-22 (×2): 30 g via ORAL
  Filled 2015-11-21 (×2): qty 45

## 2015-11-21 NOTE — ED Notes (Signed)
[facility] to [facility]

## 2015-11-21 NOTE — Consult Note (Deleted)
Advanced Heart Failure Team Consult Note  Referring Physician: Rockwell Alexandria MD Primary Care: Shirline Frees, MD Primary Cardiologist: Quay Burow, MD  Reason for Consultation: A/C Systolic HF  HPI:    [name] is a 82 y.o. male with complicated medical history including CAD with CABG 2811, Chronic systolic HF Echo 8/86/77 LVEF 30-35%, Chronic Atrial fibrillation on renally adjusted Eliquis, HLD who has been referred to the HF clinic for further evaluation.   Admitted from clinic 09/08/15 -> 09/12/15 with marked volume overload. Diuresed 21 lbs on IV lasix. Carvedilol also cut back. Lasix switched to torsemide. Echo showed EF down to 20%. He was discharged home with [place]. Discharge weight 181 lbs.  Last seen in Clinic 10/27/15. Was feeling worse. NYHA III-IIIB symptoms. Weight 173 lbs.  Has been struggling with hypotension, weight loss, and decreased appetite at home.  HF medications have been cut back. Had fall last week and overall declining.    Presented to [facility] this am with 8 lb weight gain over the past week, increased SOB, increased heart rate, and N/V over the weekend. Pertinent labs on admission include K 4.5, Creatinine 3.17 (up from 2.75), BUN 128 (up from 81), BNP 2605, WBC 6.9, Hgb 12.7, Ammonia 63.  CXR showed cardiomegaly with suspicion of mild pulmonary venous congestion. EKG with Afib RVR at rate of 125 bpm.   He has noted a large decline over the past month or so.  Now he is in "agony" most days by 4 pm per his wife with significant leg pains and SOB. Over the past few days has also had visual hallucinations (Seeing "boogers" on the ceiling). He gets up and walks as able, but activity has also decreased. Denies fever or chills. Has had Nausea with emesis daily since last Friday.   Review of Systems: [y] = yes, _0  = no   General: Weight gain [y]; Weight loss _1 ; Anorexia [y]; Fatigue [y]; Fever _2 ; Chills _3 ; Weakness y ]  Cardiac: Chest  pain/pressure _4 ; Resting SOB [y]; Exertional SOB [y]; Orthopnea [y]; Pedal Edema [y]; Palpitations _5 ; Syncope _6 ; Presyncope _7 ; Paroxysmal nocturnal dyspnea_8   Pulmonary: Cough [y]; Wheezing_9 ; Hemoptysis_10 ; Sputum _11 ; Snoring _12   GI: Vomiting_13 ; Dysphagia_14 ; Melena_15 ; Hematochezia _16 ; Heartburn_17 ; Abdominal pain [y]; Constipation _18 ; Diarrhea _19 ; BRBPR _20   GU: Hematuria_21 ; Dysuria _22 ; Nocturia_23   Vascular: Pain in legs with walking _24 ; Pain in feet with lying flat _25 ; Non-healing sores _26 ; Stroke _27 ; TIA _28 ; Slurred speech _29 ;  Neuro: Headaches_30 ; Vertigo_31 ; Seizures_32 ; Paresthesias_33 ;Blurred vision _34 ; Diplopia _35 ; Vision changes _36   Ortho/Skin: Arthritis [y]; Joint pain [y]; Muscle pain [y]; Joint swelling _37 ; Back Pain _38 ; Rash _39   Psych: Depression_40 ; Anxiety_41   Heme: Bleeding problems _42 ; Clotting disorders _43 ; Anemia _44   Endocrine: Diabetes _45 ; Thyroid dysfunction_46   Home Medications Prior to Admission medications   Medication Sig Start Date End Date Taking? Authorizing Provider  apixaban (ELIQUIS) 2.5 MG TABS tablet Take 1 tablet (2.5 mg total) by mouth 2 (two) times daily. 09/13/15  Yes Lorretta Harp, MD  carvedilol (COREG) 3.125 MG tablet Take 1 tablet (3.125 mg total) by mouth 2 (two) times daily with a meal. 11/08/15  Yes Shirley Friar, PA-C  cholecalciferol (VITAMIN D)  1000 UNITS tablet Take 1,000 Units by mouth daily.   Yes Historical Provider, MD  diphenhydrAMINE (BENADRYL) 25 MG tablet Take 25 mg by mouth daily.   Yes Historical Provider, MD  ferrous sulfate 325 (65 FE) MG tablet Take 325 mg by mouth daily with breakfast.   Yes Historical Provider, MD  gabapentin (NEURONTIN) 100 MG capsule Take 2 capsules (200 mg total) by mouth 2 (two) times daily. 11/16/15  Yes Shirley Friar, PA-C  hydrALAZINE (APRESOLINE) 50 MG tablet Take 50 mg by mouth See admin instructions. Take 50 mg by mouth 2 times daily. HOLD FOR SYSTOLIC  UNDER 462   Yes Historical Provider, MD  HYDROcodone-acetaminophen (NORCO/VICODIN) 5-325 MG tablet Take 1 tablet by mouth every 6 (six) hours as needed for moderate pain.   Yes Historical Provider, MD  metolazone (ZAROXOLYN) 2.5 MG tablet Take 1 tablet (2.5 mg total) by mouth as needed (5 pound weight gain in 24 hours). 09/12/15  Yes Amy D Clegg, NP  mometasone-formoterol (DULERA) 200-5 MCG/ACT AERO Inhale 2 puffs into the lungs 2 (two) times daily. 09/13/15  Yes Lorretta Harp, MD  omeprazole (PRILOSEC) 20 MG capsule Take 20 mg by mouth 2 (two) times daily before a meal.   Yes Historical Provider, MD  potassium chloride SA (K-DUR,KLOR-CON) 20 MEQ tablet Take 20 mEq by mouth daily.   Yes Historical Provider, MD  pravastatin (PRAVACHOL) 40 MG tablet Take 1 tablet (40 mg total) by mouth at bedtime. 09/13/15  Yes Lorretta Harp, MD  temazepam (RESTORIL) 30 MG capsule Take 30 mg by mouth at bedtime as needed for sleep.   Yes Historical Provider, MD  torsemide (DEMADEX) 20 MG tablet Take 2 tablets (40 mg total) by mouth 2 (two) times daily. 09/12/15  Yes Amy D Clegg, NP  albuterol (PROVENTIL HFA;VENTOLIN HFA) 108 (90 Base) MCG/ACT inhaler Inhale 2 puffs into the lungs every 6 (six) hours as needed for wheezing or shortness of breath. 06/16/15   [place], MD  amLODipine (NORVASC) 5 MG tablet Take 1 tablet (5 mg total) by mouth daily. Patient not taking: Reported on 11/09/2015 09/13/15   Lorretta Harp, MD  glucose monitoring kit (FREESTYLE) monitoring kit 1 each by Does not apply route as needed for other. Patient not taking: Reported on 10/31/2015 08/14/15   Drenda Freeze, MD  nitroGLYCERIN (NITROSTAT) 0.4 MG SL tablet Place 0.4 mg under the tongue every 5 (five) minutes as needed for chest pain. Reported on 09/08/2015    Historical Provider, MD    Past Medical History: Past Medical History:  Diagnosis Date  . Angina at rest [facility]) 02/27/2011  . Arthritis   . Blood transfusion    no  reaction to transfusion per patient  . CAD (coronary artery disease) 02/27/2011   Catheterized on 02/28/11, graft supplying marginal vessel was occluded, after 48 hours of hydration, a repeat cath was performed in an attempt at PCI 03/02/11 cath had an unsuccessful attempt at PCI to an occluded calcified circumflex vessel, Residual distal tip of the ChoICE PT moderate support wire at the subtotal occlusion. No change in the subtotal occlusion,improved retrograde collateralization  . CAD (coronary artery disease)    PTCA of the right coronary artery in Feb. 1987, July 1987, and June 1990. Circumflex PTCA in July 1993, followed by a RCA, PTCA in Sept. 1994. CABG in 1994x5 with a LIMA to the LAD, vein graft to the first diagonal, vein graft to the marginal and left circumflex  coronary artery, and sequential graft to the PDA and PLA branches of the RCA  . carotid doppler    Left ECA : The proximal most aspect of the left external carotid artery demonstrates a peak systolic velocity of 144.8 cm/s. This is consistent with a 70-99% significant stenosis correlated with a 2007 angiogram.  . CHF (congestive heart failure) ([place])   . CKD (chronic kidney disease) stage 3, GFR 30-59 ml/min 02/27/2011  . Deficiency anemia 08/05/2015  . Diabetes mellitus 02/27/2011   insulin dependent  . GERD (gastroesophageal reflux disease)   . HTN (hypertension)    Renal dopplers have been performed on a semi annual basis. Left renal artery demonstrated vessel narrowing with elevated velocities consistent with a 1-59% diameter reduction.   . Hyperlipemia 02/27/2011  . Hypertension   . LBBB (left bundle branch block) 02/27/2011  . LV dysfunction 02/27/2011  . Myocardial infarction ([place])   . Paroxysmal atrial fibrillation (H[facility])   . PVD (peripheral vascular disease) ([place])    Carotid Angiography was performed in 2007, no treatment delivered at that time. Right carotid endarterectomy in 2009, being followed by duplex ultrasound. Doppler  remains stable.    Past Surgical History: Past Surgical History:  Procedure Laterality Date  . BACK SURGERY    . CAROTID ENDARTERECTOMY  2009  . CORONARY ARTERY BYPASS GRAFT  1994  . ESOPHAGOGASTRODUODENOSCOPY (EGD) WITH PROPOFOL N/A 06/30/2015   Procedure: ESOPHAGOGASTRODUODENOSCOPY (EGD) WITH PROPOFOL;  Surgeon: Laurence Spates, MD;  Location: [facility] ENDOSCOPY;  Service: Endoscopy;  Laterality: N/A;  . LEFT HEART CATHETERIZATION WITH CORONARY/GRAFT ANGIOGRAM N/A 02/28/2011   Procedure: LEFT HEART CATHETERIZATION WITH Beatrix Fetters;  Surgeon: Troy Sine, MD;  Location: [facility] CATH LAB;  Service: Cardiovascular;  Laterality: N/A;  . Myoview scan  03/30/2011  . PERCUTANEOUS CORONARY STENT INTERVENTION (PCI-S) N/A 03/02/2011   Procedure: PERCUTANEOUS CORONARY STENT INTERVENTION (PCI-S);  Surgeon: Troy Sine, MD;  Location: [facility] CATH LAB;  Service: Cardiovascular;  Laterality: N/A;  . SPINAL FUSION      Family History: Family History  Problem Relation Age of Onset  . Cancer Mother     unknown to pt  . Cancer Father     pancreas  . Cancer Paternal Grandfather     prostate    Social History: Social History   Social History  . Marital status: Married    Spouse name: N/A  . Number of children: N/A  . Years of education: N/A   Social History Main Topics  . Smoking status: Former Smoker    Years: 22.00    Quit date: 03/26/1958  . Smokeless tobacco: Never Used     Comment: quit smoking in the 1960's  . Alcohol use No  . Drug use: No  . Sexual activity: Yes   Other Topics Concern  . None   Social History Narrative  . None    Allergies:  Allergies  Allergen Reactions  . Lisinopril Cough    Objective:    Vital Signs:   Pulse Rate:  [110] 110 (08/28 1115) Resp:  [14] 14 (08/28 1115) BP: (104)/(88) 104/88 (08/28 1115) SpO2:  [100 %] 100 % (08/28 1115)    Weight change: There were no vitals filed for this visit.  Intake/Output:  No intake or output data in  the 24 hours ending 11/16/2015 1342   Physical Exam: General:  Elderly and Chronically ill appearing.  HEENT: normal Neck: supple. JVP to jaw. Carotids 2+ bilat; no bruits. No thyromegaly or nodule noted.  Cor: PMI nondisplaced. Regular rate & rhythm. No M/G/R. Lungs: Diminished basilar sounds with scant crackles.  Abdomen: soft, NT, ND, no HSM. No bruits or masses. +BS  Extremities: no cyanosis, clubbing, rash. Trace-1+ ankle edema L>R Neuro: alert & orientedx3, cranial nerves grossly intact. moves all 4 extremities w/o difficulty. Affect pleasant  Telemetry: Reviewed personally, Afib 120s  Labs: Basic Metabolic Panel:  Recent Labs Lab 11/08/2015 1102  NA 133*  K 4.5  CL 99*  CO2 20*  GLUCOSE 227*  BUN 128*  CREATININE 3.17*  CALCIUM 9.1    Liver Function Tests:  Recent Labs Lab 11/05/2015 1102  AST 100*  ALT 65*  ALKPHOS 118  BILITOT 2.1*  PROT 6.2*  ALBUMIN 3.3*   No results for input(s): LIPASE, AMYLASE in the last 168 hours.  Recent Labs Lab 11/05/2015 1102  AMMONIA 63*    CBC:  Recent Labs Lab 11/13/2015 1102  WBC 6.9  NEUTROABS 5.6  HGB 12.7*  HCT 39.0  MCV 97.7  PLT 176    Cardiac Enzymes: No results for input(s): CKTOTAL, CKMB, CKMBINDEX, TROPONINI in the last 168 hours.  BNP: BNP (last 3 results)  Recent Labs  09/26/15 1155 10/27/15 1145 10/28/2015 1102  BNP 2,500.3* 3,644.3* 2,605.8*    ProBNP (last 3 results) No results for input(s): PROBNP in the last 8760 hours.   CBG: No results for input(s): GLUCAP in the last 168 hours.  Coagulation Studies:  Recent Labs  11/07/2015 1102  LABPROT 35.5*  INR 3.44    Other results: EKG: Afib RVR at rate of 125 bpm.   Imaging: Dg Chest 2 View  Result Date: 11/22/2015 CLINICAL DATA:  Short of breath. Dizziness. Disoriented. Recent waking. Hospice patient. EXAM: CHEST  2 VIEW COMPARISON:  06/16/2015 FINDINGS: Lateral view degraded by patient arm position. Moderate thoracic spondylosis.  Midline trachea. Moderate cardiomegaly. Prior median sternotomy. No pleural effusion or pneumothorax. Mild pulmonary interstitial thickening is not significantly changed. No lobar consolidation. IMPRESSION: Cardiomegaly with suspicion of mild pulmonary venous congestion. Electronically Signed   By: Abigail Miyamoto M.D.   On: 11/13/2015 12:02   Dg Abd 1 View  Result Date: 11/19/2015 CLINICAL DATA:  Nausea, vomiting, dizziness since Friday. EXAM: ABDOMEN - 1 VIEW COMPARISON:  PET CT 05/17/2015 FINDINGS: Postoperative changes in the lumbar spine. Nonobstructive bowel gas pattern. No free air organomegaly. No suspicious calcification. IMPRESSION: No acute findings. Electronically Signed   By: Rolm Baptise M.D.   On: 11/04/2015 12:02      Medications:     Current Medications:    Infusions:      Assessment   1. A/C combined systolic HF - Echo 09/6732 EF 20% 2. Chronic AF with Acute RVR 3. ARF on CKD stage IV 4. CAD s/p CABG 1994 5. Hypotension 6. Neuropathy 7. Elevated Ammonia 8. Transaminitis  Plan    Pt is volume overloaded and in ARF with concomitant transminitis in the setting of end stage Heart Failure. Suspect that patient prognosis is very poor, and while he has a small chance of recovery from this instance, suspect that he will continue to decline over the next several weeks to months.   Will give 60 mg IV lasix now and gauge response.   Consider lactulose for elevated ammonia, but concerned with patients already poor fluid balance with hypotension.   Will discuss with MD.  Pt and family state they prefer not to do residential hospice, but have full hospice in the home instead. Will give family time  to think and talk about and return with MD shortly.   Length of Stay: 0  Shirley Friar PA-C 11/15/2015, 1:42 PM  Advanced Heart Failure Team Pager [phone] (M-F; [place])  Please contact [facility] for night-coverage after hours (4p -7a ) and weekends on  [email or web address]

## 2015-11-21 NOTE — H&P (Addendum)
Advanced Heart Failure Team History and Physical Note   Referring Physician: Rockwell Alexandria MD Primary Care: Shirline Frees, MD Primary Cardiologist: Quay Burow, MD  Reason for Consultation:  A/C Systolic HF - End Stage   HPI:    [name] a 82 y.o.male with complicated medical history including CAD with CABG 9381, Chronic systolic HF Echo 0/17/51 LVEF 30-35%, Chronic Atrial fibrillation on renally adjusted Eliquis, HLD who has been referred to the HF clinic for further evaluation.   Admitted from clinic 09/08/15 ->09/12/15 with marked volume overload. Diuresed 21 lbs on IV lasix. Carvedilol also cut back. Lasix switched to torsemide. Echo showed EF down to 20%. He was discharged home with [place]. Discharge weight 181 lbs.  Last seen in Clinic 10/27/15. Was feeling worse. NYHA III-IIIB symptoms. Weight 173 lbs.  Has been struggling with hypotension, weight loss, and decreased appetite at home.  HF medications have been cut back. Had fall last week and overall declining.    Presented to [facility] this am with 8 lb weight gain over the past week, increased SOB, increased heart rate, and N/V over the weekend. Pertinent labs on admission include K 4.5, Creatinine 3.17 (up from 2.75), BUN 128 (up from 81), BNP 2605, WBC 6.9, Hgb 12.7, Ammonia 63.  CXR showed cardiomegaly with suspicion of mild pulmonary venous congestion. EKG with Afib RVR at rate of 125 bpm.   He has noted a large decline over the past month or so.  Now he is in "agony" most days by 4 pm per his wife with significant leg pains and SOB. Over the past few days has also had visual hallucinations (Seeing "boogers" on the ceiling). He gets up and walks as able, but activity has also decreased. Denies fever or chills. Has had Nausea with emesis daily since last Friday.   Review of Systems: [y] = yes, _0  = no    General: Weight gain [y]; Weight loss _1 ; Anorexia [y]; Fatigue [y]; Fever _2 ; Chills [  ]; Weakness y ]   Cardiac: Chest pain/pressure _3 ; Resting SOB [y]; Exertional SOB [y]; Orthopnea [y]; Pedal Edema [y]; Palpitations _4 ; Syncope _5 ; Presyncope _6 ; Paroxysmal nocturnal dyspnea_7    Pulmonary: Cough [y]; Wheezing_8 ; Hemoptysis_9 ; Sputum _10 ; Snoring _11    GI: Vomiting_12 ; Dysphagia_13 ; Melena_14 ; Hematochezia _15 ; Heartburn_16 ; Abdominal pain [y]; Constipation _17 ; Diarrhea _18 ; BRBPR _19    GU: Hematuria_20 ; Dysuria _21 ; Nocturia_22    Vascular: Pain in legs with walking _23 ; Pain in feet with lying flat _24 ; Non-healing sores _25 ; Stroke _26 ; TIA _27 ; Slurred speech _28 ;   Neuro: Headaches_29 ; Vertigo_30 ; Seizures_31 ; Paresthesias_32 ;Blurred vision _33 ; Diplopia _34 ; Vision changes _35    Ortho/Skin: Arthritis [y]; Joint pain [y]; Muscle pain [y]; Joint swelling _36 ; Back Pain _37 ; Rash _38    Psych: Depression_39 ; Anxiety_40    Heme: Bleeding problems _41 ; Clotting disorders _42 ; Anemia _43    Endocrine: Diabetes _44 ; Thyroid dysfunction_45    Home Medications Prior to Admission medications   Medication Sig Start Date End Date Taking? Authorizing Provider  apixaban (ELIQUIS) 2.5 MG TABS tablet Take 1 tablet (2.5 mg total) by mouth 2 (two) times daily. 09/13/15  Yes Lorretta Harp, MD  carvedilol (COREG) 3.125 MG tablet Take 1 tablet (3.125 mg total) by mouth 2 (two)  times daily with a meal. 11/08/15  Yes Shirley Friar, PA-C  cholecalciferol (VITAMIN D) 1000 UNITS tablet Take 1,000 Units by mouth daily.   Yes Historical Provider, MD  diphenhydrAMINE (BENADRYL) 25 MG tablet Take 25 mg by mouth daily.   Yes Historical Provider, MD  ferrous sulfate 325 (65 FE) MG tablet Take 325 mg by mouth daily with breakfast.   Yes Historical Provider, MD  gabapentin (NEURONTIN) 100 MG capsule Take 2 capsules (200 mg total) by mouth 2 (two) times daily. 11/16/15  Yes Shirley Friar, PA-C  hydrALAZINE (APRESOLINE) 50 MG tablet Take 50 mg by mouth See admin instructions.  Take 50 mg by mouth 2 times daily. HOLD FOR SYSTOLIC UNDER 662   Yes Historical Provider, MD  HYDROcodone-acetaminophen (NORCO/VICODIN) 5-325 MG tablet Take 1 tablet by mouth every 6 (six) hours as needed for moderate pain.   Yes Historical Provider, MD  metolazone (ZAROXOLYN) 2.5 MG tablet Take 1 tablet (2.5 mg total) by mouth as needed (5 pound weight gain in 24 hours). 09/12/15  Yes Amy D Clegg, NP  mometasone-formoterol (DULERA) 200-5 MCG/ACT AERO Inhale 2 puffs into the lungs 2 (two) times daily. 09/13/15  Yes Lorretta Harp, MD  omeprazole (PRILOSEC) 20 MG capsule Take 20 mg by mouth 2 (two) times daily before a meal.   Yes Historical Provider, MD  potassium chloride SA (K-DUR,KLOR-CON) 20 MEQ tablet Take 20 mEq by mouth daily.   Yes Historical Provider, MD  pravastatin (PRAVACHOL) 40 MG tablet Take 1 tablet (40 mg total) by mouth at bedtime. 09/13/15  Yes Lorretta Harp, MD  temazepam (RESTORIL) 30 MG capsule Take 30 mg by mouth at bedtime as needed for sleep.   Yes Historical Provider, MD  torsemide (DEMADEX) 20 MG tablet Take 2 tablets (40 mg total) by mouth 2 (two) times daily. 09/12/15  Yes Amy D Clegg, NP  albuterol (PROVENTIL HFA;VENTOLIN HFA) 108 (90 Base) MCG/ACT inhaler Inhale 2 puffs into the lungs every 6 (six) hours as needed for wheezing or shortness of breath. 06/16/15   [place], MD  amLODipine (NORVASC) 5 MG tablet Take 1 tablet (5 mg total) by mouth daily. Patient not taking: Reported on 11/04/2015 09/13/15   Lorretta Harp, MD  glucose monitoring kit (FREESTYLE) monitoring kit 1 each by Does not apply route as needed for other. Patient not taking: Reported on 11/09/2015 08/14/15   Drenda Freeze, MD  nitroGLYCERIN (NITROSTAT) 0.4 MG SL tablet Place 0.4 mg under the tongue every 5 (five) minutes as needed for chest pain. Reported on 09/08/2015    Historical Provider, MD    Past Medical History: Past Medical History:  Diagnosis Date  . Angina at rest [facility])  02/27/2011  . Arthritis   . Blood transfusion    no reaction to transfusion per patient  . CAD (coronary artery disease) 02/27/2011   Catheterized on 02/28/11, graft supplying marginal vessel was occluded, after 48 hours of hydration, a repeat cath was performed in an attempt at PCI 03/02/11 cath had an unsuccessful attempt at PCI to an occluded calcified circumflex vessel, Residual distal tip of the ChoICE PT moderate support wire at the subtotal occlusion. No change in the subtotal occlusion,improved retrograde collateralization  . CAD (coronary artery disease)    PTCA of the right coronary artery in Feb. 1987, July 1987, and June 1990. Circumflex PTCA in July 1993, followed by a RCA, PTCA in Sept. 1994. CABG in 1994x5 with a LIMA to  the LAD, vein graft to the first diagonal, vein graft to the marginal and left circumflex coronary artery, and sequential graft to the PDA and PLA branches of the RCA  . carotid doppler    Left ECA : The proximal most aspect of the left external carotid artery demonstrates a peak systolic velocity of 493.9 cm/s. This is consistent with a 70-99% significant stenosis correlated with a 2007 angiogram.  . CHF (congestive heart failure) (HCC)   . CKD (chronic kidney disease) stage 3, GFR 30-59 ml/min 02/27/2011  . Deficiency anemia 08/05/2015  . Diabetes mellitus 02/27/2011   insulin dependent  . GERD (gastroesophageal reflux disease)   . HTN (hypertension)    Renal dopplers have been performed on a semi annual basis. Left renal artery demonstrated vessel narrowing with elevated velocities consistent with a 1-59% diameter reduction.   . Hyperlipemia 02/27/2011  . Hypertension   . LBBB (left bundle branch block) 02/27/2011  . LV dysfunction 02/27/2011  . Myocardial infarction (HCC)   . On home oxygen therapy    "3L; 24/7" (11/18/2015)  . Paroxysmal atrial fibrillation (HCC)   . PVD (peripheral vascular disease) (HCC)    Carotid Angiography was performed in 2007, no treatment  delivered at that time. Right carotid endarterectomy in 2009, being followed by duplex ultrasound. Doppler remains stable.    Past Surgical History: Past Surgical History:  Procedure Laterality Date  . BACK SURGERY    . CAROTID ENDARTERECTOMY  2009  . CORONARY ARTERY BYPASS GRAFT  1994  . ESOPHAGOGASTRODUODENOSCOPY (EGD) WITH PROPOFOL N/A 06/30/2015   Procedure: ESOPHAGOGASTRODUODENOSCOPY (EGD) WITH PROPOFOL;  Surgeon: Carman Ching, MD;  Location: [facility] ENDOSCOPY;  Service: Endoscopy;  Laterality: N/A;  . LEFT HEART CATHETERIZATION WITH CORONARY/GRAFT ANGIOGRAM N/A 02/28/2011   Procedure: LEFT HEART CATHETERIZATION WITH Isabel Caprice;  Surgeon: Lennette Bihari, MD;  Location: [facility] CATH LAB;  Service: Cardiovascular;  Laterality: N/A;  . Myoview scan  03/30/2011  . PERCUTANEOUS CORONARY STENT INTERVENTION (PCI-S) N/A 03/02/2011   Procedure: PERCUTANEOUS CORONARY STENT INTERVENTION (PCI-S);  Surgeon: Lennette Bihari, MD;  Location: [facility] CATH LAB;  Service: Cardiovascular;  Laterality: N/A;  . SPINAL FUSION      Family History:  Family History  Problem Relation Age of Onset  . Cancer Mother     unknown to pt  . Cancer Father     pancreas  . Cancer Paternal Grandfather     prostate    Social History: Social History   Social History  . Marital status: Married    Spouse name: N/A  . Number of children: N/A  . Years of education: N/A   Social History Main Topics  . Smoking status: Former Smoker    Years: 22.00    Quit date: 03/26/1958  . Smokeless tobacco: Never Used     Comment: quit smoking in the 1960's  . Alcohol use No  . Drug use: No  . Sexual activity: Yes   Other Topics Concern  . None   Social History Narrative  . None    Allergies:  Allergies  Allergen Reactions  . Lisinopril Cough    Objective:    Vital Signs:   Temp:  [98.7 F (37.1 C)] 98.7 F (37.1 C) (08/29 0617) Pulse Rate:  [106-116] 116 (08/29 2053) Resp:  [18] 18 (08/29 2053) BP:  (91-95)/(66-67) 92/66 (08/29 2053) SpO2:  [98 %-100 %] 100 % (08/29 2053) Weight:  [77.9 kg (171 lb 11.2 oz)] 77.9 kg (171 lb 11.2 oz) (08/29 0617)  Last BM Date: 11/19/15 Filed Weights   11/17/2015 1813 11/22/15 0617  Weight: 77.5 kg (170 lb 14.4 oz) 77.9 kg (171 lb 11.2 oz)    Physical Exam: General:  Elderly and Chronically ill appearing.  HEENT: normal Neck: supple. JVP to jaw. Carotids 2+ bilat; no bruits. No thyromegaly or nodule noted.  Cor: PMI nondisplaced. Irregular rate & rhythm. No M/G/R. Lungs: Diminished basilar sounds with scant crackles.  Abdomen: soft, NT, ND, no HSM. No bruits or masses. +BS  Extremities: no cyanosis, clubbing, rash. Trace-1+ ankle edema L>R Neuro: alert. Mildly confused at times, cranial nerves grossly intact. moves all 4 extremities w/o difficulty. Affect pleasant  Telemetry: Reviewed personally, Afib 120s  Labs: Basic Metabolic Panel:  Recent Labs Lab 11/10/2015 1102 11/22/15 0615  NA 133* 133*  K 4.5 5.2*  CL 99* 100*  CO2 20* 20*  GLUCOSE 227* 185*  BUN 128* 130*  CREATININE 3.17* 3.29*  CALCIUM 9.1 9.1  MG 2.0  --     Liver Function Tests:  Recent Labs Lab 11/08/2015 1102 11/22/15 0615  AST 100* 93*  ALT 65* 67*  ALKPHOS 118 98  BILITOT 2.1* 2.1*  PROT 6.2* 5.9*  ALBUMIN 3.3* 2.9*   No results for input(s): LIPASE, AMYLASE in the last 168 hours.  Recent Labs Lab 11/09/2015 1102 11/22/15 0615  AMMONIA 63* 81*    CBC:  Recent Labs Lab 11/17/2015 1102  WBC 6.9  NEUTROABS 5.6  HGB 12.7*  HCT 39.0  MCV 97.7  PLT 176    Cardiac Enzymes: No results for input(s): CKTOTAL, CKMB, CKMBINDEX, TROPONINI in the last 168 hours.  BNP: BNP (last 3 results)  Recent Labs  09/26/15 1155 10/27/15 1145 11/09/2015 1102  BNP 2,500.3* 3,644.3* 2,605.8*    ProBNP (last 3 results) No results for input(s): PROBNP in the last 8760 hours.   CBG:  Recent Labs Lab 11/14/2015 2022 11/22/15 0647 11/22/15 1204 11/22/15 1659  11/22/15 2051  GLUCAP 230* 180* 153* 184* 176*    Coagulation Studies:  Recent Labs  10/25/2015 1102  LABPROT 35.5*  INR 3.44    Other results: EKG: Afib with RVR  Imaging: Dg Chest 2 View  Result Date: 11/08/2015 CLINICAL DATA:  Short of breath. Dizziness. Disoriented. Recent waking. Hospice patient. EXAM: CHEST  2 VIEW COMPARISON:  06/16/2015 FINDINGS: Lateral view degraded by patient arm position. Moderate thoracic spondylosis. Midline trachea. Moderate cardiomegaly. Prior median sternotomy. No pleural effusion or pneumothorax. Mild pulmonary interstitial thickening is not significantly changed. No lobar consolidation. IMPRESSION: Cardiomegaly with suspicion of mild pulmonary venous congestion. Electronically Signed   By: Abigail Miyamoto M.D.   On: 11/18/2015 12:02   Dg Abd 1 View  Result Date: 11/01/2015 CLINICAL DATA:  Nausea, vomiting, dizziness since Friday. EXAM: ABDOMEN - 1 VIEW COMPARISON:  PET CT 05/17/2015 FINDINGS: Postoperative changes in the lumbar spine. Nonobstructive bowel gas pattern. No free air organomegaly. No suspicious calcification. IMPRESSION: No acute findings. Electronically Signed   By: Rolm Baptise M.D.   On: 11/18/2015 12:02     Assessment:   1. A/C combined systolic HF - Echo 05/3352 EF 20% 2. Chronic AF with Acute RVR 3. ARF on CKD stage IV 4. CAD s/p CABG 1994 5. Hypotension 6. Neuropathy 7. Elevated Ammonia -> hepatic encephalopathy  8. Transaminitis  Plan/Discussion:    Pt is volume overloaded and in ARF with concomitant transminitis in the setting of end stage Heart Failure. Suspect that patient prognosis is very poor, and while he has  a small chance of recovery from this instance, suspect that he will continue to decline over the next several weeks to months.   Will give 60 mg IV lasix now to gauge BP response, and then give 80 mg BID as long as pressure tolerates.   Start amio 200 mg BID for rate control.  With short term prognosis, side  effect profile of amiodarone will likely not be an issue.   Start lactulose 30 mg BID for elevated ammonia.  We will admit for short term diuresis and optimization, with family aware that patient likely has weeks to live.  We will continue to recommend [facility] as best option for pt moving forward.  If he does end up going back home, we will recommend aggressive symptom management with no further hospitalization.   Length of Stay: 2   Oda Kilts, PA-C 11/23/2015, 12:04 AM  Advanced Heart Failure Team Pager [phone] (M-F; 7a - 4p)  Please contact [facility] for night-coverage after hours (4p -7a ) and weekends on [email or web address]  Patient seen and examined with Oda Kilts, PA-C. We discussed all aspects of the encounter. I agree with the assessment and plan as stated above.   He has end-stage HF now complicated by liver failure and renal failure with apparent hepatic encephalopathy. He is followed by Hospice who has recommended he come to the ER due to his AF with RVR.I spoke with family at length and they have another family member who is also currently on Hospice and pending placement at [facility].   We will admit Mr. Betzold for IV diuresis for his HF and also to help with comfort measures. Start low-dose lactulose to see if this helps with his mental status. Will also start po amio to see if this will help with rate control for his AF. I spoke with the Hospice team by phone as well to inform them of his admission and to establish goals.    Kruz Chiu,MD 12:04 AM

## 2015-11-21 NOTE — ED Notes (Signed)
Report attempted 

## 2015-11-21 NOTE — Telephone Encounter (Signed)
Megan, RN [facility] called with updates: patient has been managing a fever over the weekend, weight elevated at 170 lbs, increased SOB, irregular/tachy HR (110-117), pt's wife reports decreased appetite over the weekend with N/V, blood pressure 110/70   Please advise regarding HR

## 2015-11-21 NOTE — ED Triage Notes (Signed)
Since Friday, pt. Has been dizzy and disoriented according to wife with nausea and vomiting. An 8 lb weight gain in 7 days and was sent over by his hospice nurse. Pt. Is in heart failure with EF of around 20 and end stage renal disease. HR is in the 120's and wears 3L O2 at home and complaining of SOB

## 2015-11-21 NOTE — ED Provider Notes (Signed)
[place] DEPT Provider Note   CSN: [id number] Arrival date & time: 11/09/2015  1025     History   Chief Complaint Chief Complaint  Patient presents with  . Irregular Heart Beat    HPI [name] is a 82 y.o. male.  HPI Since Friday, pt. Has been dizzy and disoriented according to wife with nausea and vomiting. An 8 lb weight gain in 7 days and was sent over by his hospice nurse. Pt. Is in heart failure with EF of around 20 and end stage renal disease. HR is in the 120's and wears 3L O2 at home and complaining of SOB  Past Medical History:  Diagnosis Date  . Angina at rest [facility]) 02/27/2011  . Arthritis   . Blood transfusion    no reaction to transfusion per patient  . CAD (coronary artery disease) 02/27/2011   Catheterized on 02/28/11, graft supplying marginal vessel was occluded, after 48 hours of hydration, a repeat cath was performed in an attempt at PCI 03/02/11 cath had an unsuccessful attempt at PCI to an occluded calcified circumflex vessel, Residual distal tip of the ChoICE PT moderate support wire at the subtotal occlusion. No change in the subtotal occlusion,improved retrograde collateralization  . CAD (coronary artery disease)    PTCA of the right coronary artery in Feb. 1987, July 1987, and June 1990. Circumflex PTCA in July 1993, followed by a RCA, PTCA in Sept. 1994. CABG in 1994x5 with a LIMA to the LAD, vein graft to the first diagonal, vein graft to the marginal and left circumflex coronary artery, and sequential graft to the PDA and PLA branches of the RCA  . carotid doppler    Left ECA : The proximal most aspect of the left external carotid artery demonstrates a peak systolic velocity of 185.6 cm/s. This is consistent with a 70-99% significant stenosis correlated with a 2007 angiogram.  . CHF (congestive heart failure) ([place])   . CKD (chronic kidney disease) stage 3, GFR 30-59 ml/min 02/27/2011  . Deficiency anemia 08/05/2015  . Diabetes mellitus 02/27/2011     insulin dependent  . GERD (gastroesophageal reflux disease)   . HTN (hypertension)    Renal dopplers have been performed on a semi annual basis. Left renal artery demonstrated vessel narrowing with elevated velocities consistent with a 1-59% diameter reduction.   . Hyperlipemia 02/27/2011  . Hypertension   . LBBB (left bundle branch block) 02/27/2011  . LV dysfunction 02/27/2011  . Myocardial infarction ([place])   . On home oxygen therapy    "3L; 24/7" (11/12/2015)  . Paroxysmal atrial fibrillation (HCC)   . PVD (peripheral vascular disease) ([place])    Carotid Angiography was performed in 2007, no treatment delivered at that time. Right carotid endarterectomy in 2009, being followed by duplex ultrasound. Doppler remains stable.    Patient Active Problem List   Diagnosis Date Noted  . Acute on chronic systolic (congestive) heart failure ([place]) 11/03/2015  . Acute on chronic combined systolic and diastolic heart failure, NYHA class 3 ([place]) 09/08/2015  . Slurred speech 08/14/2015  . Deficiency anemia 08/05/2015  . Anemia of chronic renal failure, stage 4 (severe) ([place]) 08/05/2015  . Congestive heart failure with cardiomyopathy ([place]) 08/05/2015  . Hypoxemia 06/16/2015  . Lymphadenopathy, mediastinal 05/07/2015  . COPD (chronic obstructive pulmonary disease) with emphysema ([place]) 05/04/2015  . Pneumonia 05/04/2015  . Abnormal chest x-ray 05/04/2015  . Dyspnea on exertion 05/03/2015  . Anemia of chronic disease 05/03/2015  . Coronary artery disease with  angina pectoris (HCC) 03/13/2015  . Chronic anticoagulation 12/14/2014  . PAF (paroxysmal atrial fibrillation) (HCC) 08/27/2014  . Hyperkalemia 01/13/2014  . SBO (small bowel obstruction)- Oct 2014- conservative Rx 12/22/2012  . PVD - RCE '09 12/04/2012  . Chronic renal insufficiency, stage IV (severe) 12/04/2012  . Angina at rest [facility]) 02/27/2011  . CAD - CABG '94. PCI attempt 2012, Myoview low risk Jan 2013 02/27/2011  . Cardiomyopathy,  ischemic- last EF 35-40% by echo May 2016 02/27/2011  . LBBB (left bundle branch block) 02/27/2011  . Hyperlipemia 02/27/2011  . Essential hypertension 12/06/2008  . Cough 12/06/2008    Past Surgical History:  Procedure Laterality Date  . BACK SURGERY    . CAROTID ENDARTERECTOMY  2009  . CORONARY ARTERY BYPASS GRAFT  1994  . ESOPHAGOGASTRODUODENOSCOPY (EGD) WITH PROPOFOL N/A 06/30/2015   Procedure: ESOPHAGOGASTRODUODENOSCOPY (EGD) WITH PROPOFOL;  Surgeon: Carman Ching, MD;  Location: [facility] ENDOSCOPY;  Service: Endoscopy;  Laterality: N/A;  . LEFT HEART CATHETERIZATION WITH CORONARY/GRAFT ANGIOGRAM N/A 02/28/2011   Procedure: LEFT HEART CATHETERIZATION WITH Isabel Caprice;  Surgeon: Lennette Bihari, MD;  Location: [facility] CATH LAB;  Service: Cardiovascular;  Laterality: N/A;  . Myoview scan  03/30/2011  . PERCUTANEOUS CORONARY STENT INTERVENTION (PCI-S) N/A 03/02/2011   Procedure: PERCUTANEOUS CORONARY STENT INTERVENTION (PCI-S);  Surgeon: Lennette Bihari, MD;  Location: [facility] CATH LAB;  Service: Cardiovascular;  Laterality: N/A;  . SPINAL FUSION         Home Medications    Prior to Admission medications   Medication Sig Start Date End Date Taking? Authorizing Provider  apixaban (ELIQUIS) 2.5 MG TABS tablet Take 1 tablet (2.5 mg total) by mouth 2 (two) times daily. 09/13/15  Yes [name], MD  carvedilol (COREG) 3.125 MG tablet Take 1 tablet (3.125 mg total) by mouth 2 (two) times daily with a meal. 11/08/15  Yes Graciella Freer, PA-C  diphenhydrAMINE (BENADRYL) 25 MG tablet Take 25 mg by mouth daily.   Yes Historical Provider, MD  gabapentin (NEURONTIN) 100 MG capsule Take 2 capsules (200 mg total) by mouth 2 (two) times daily. 11/16/15  Yes Graciella Freer, PA-C  HYDROcodone-acetaminophen (NORCO/VICODIN) 5-325 MG tablet Take 1 tablet by mouth every 6 (six) hours as needed for moderate pain.   Yes Historical Provider, MD  metolazone (ZAROXOLYN) 2.5 MG tablet Take 1 tablet  (2.5 mg total) by mouth as needed (5 pound weight gain in 24 hours). 09/12/15  Yes Amy D Clegg, NP  mometasone-formoterol (DULERA) 200-5 MCG/ACT AERO Inhale 2 puffs into the lungs 2 (two) times daily. 09/13/15  Yes [name], MD  omeprazole (PRILOSEC) 20 MG capsule Take 20 mg by mouth 2 (two) times daily before a meal.   Yes Historical Provider, MD  potassium chloride SA (K-DUR,KLOR-CON) 20 MEQ tablet Take 20 mEq by mouth daily.   Yes Historical Provider, MD  temazepam (RESTORIL) 30 MG capsule Take 30 mg by mouth at bedtime as needed for sleep.   Yes Historical Provider, MD  torsemide (DEMADEX) 20 MG tablet Take 2 tablets (40 mg total) by mouth 2 (two) times daily. 09/12/15  Yes Amy D Clegg, NP  albuterol (PROVENTIL HFA;VENTOLIN HFA) 108 (90 Base) MCG/ACT inhaler Inhale 2 puffs into the lungs every 6 (six) hours as needed for wheezing or shortness of breath. 06/16/15   Jose Alexis Frock, MD  glucose monitoring kit (FREESTYLE) monitoring kit 1 each by Does not apply route as needed for other. Patient not taking: Reported  on 11/02/2015 08/14/15   Drenda Freeze, MD  nitroGLYCERIN (NITROSTAT) 0.4 MG SL tablet Place 0.4 mg under the tongue every 5 (five) minutes as needed for chest pain. Reported on 09/08/2015    Historical Provider, MD    Family History Family History  Problem Relation Age of Onset  . Cancer Mother     unknown to pt  . Cancer Father     pancreas  . Cancer Paternal Grandfather     prostate    Social History Social History  Substance Use Topics  . Smoking status: Former Smoker    Years: 22.00    Quit date: 03/26/1958  . Smokeless tobacco: Never Used     Comment: quit smoking in the 1960's  . Alcohol use No     Allergies   Lisinopril   Review of Systems Review of Systems  All other systems reviewed and are negative Physical Exam Updated Vital Signs BP 91/67 (BP Location: Left Arm)   Pulse (!) 115   Temp 98.7 F (37.1 C) (Oral)   Resp 18   Ht '5\' 10"'$   (1.778 m)   Wt 171 lb 11.2 oz (77.9 kg) Comment: scale b  SpO2 100%   BMI 24.64 kg/m   Physical Exam Physical Exam  Nursing note and vitals reviewed. Constitutional: He is oriented to person, place, and time. He appears well-developed and well-nourished. No distress.  patient wearing nasal option when she does it on. HENT:  Head: Normocephalic and atraumatic.  Eyes: Pupils are equal, round, and reactive to light.  Neck: Normal range of motion.  JVD present.   Cardiovascular: Normal rate and intact distal pulses.  Crackles noted to auscultation at bases.   Pulmonary/Chest: No respiratory distress.  Abdominal: Normal appearance. He exhibits slight distention.  No focal tenderness.  Active bowel sounds..  Musculoskeletal: Normal range of motion.  Neurological: He is alert and oriented to person, place, and time. No cranial nerve deficit.  Skin: Skin is warm and dry. No rash noted.  Psychiatric: He has a normal mood and affect. His behavior is normal.    ED Treatments / Results  Labs (all labs ordered are listed, but only abnormal results are displayed) Labs Reviewed  CBC WITH DIFFERENTIAL/PLATELET - Abnormal; Notable for the following:       Result Value   RBC 3.99 (*)    Hemoglobin 12.7 (*)    RDW 17.4 (*)    Lymphs Abs 0.6 (*)    All other components within normal limits  COMPREHENSIVE METABOLIC PANEL - Abnormal; Notable for the following:    Sodium 133 (*)    Chloride 99 (*)    CO2 20 (*)    Glucose, Bld 227 (*)    BUN 128 (*)    Creatinine, Ser 3.17 (*)    Total Protein 6.2 (*)    Albumin 3.3 (*)    AST 100 (*)    ALT 65 (*)    Total Bilirubin 2.1 (*)    GFR calc non Af Amer 17 (*)    GFR calc Af Amer 20 (*)    All other components within normal limits  PROTIME-INR - Abnormal; Notable for the following:    Prothrombin Time 35.5 (*)    All other components within normal limits  AMMONIA - Abnormal; Notable for the following:    Ammonia 63 (*)    All other  components within normal limits  BRAIN NATRIURETIC PEPTIDE - Abnormal; Notable for the following:  B Natriuretic Peptide 2,605.8 (*)    All other components within normal limits  BASIC METABOLIC PANEL - Abnormal; Notable for the following:    Sodium 133 (*)    Potassium 5.2 (*)    Chloride 100 (*)    CO2 20 (*)    Glucose, Bld 185 (*)    BUN 130 (*)    Creatinine, Ser 3.29 (*)    GFR calc non Af Amer 16 (*)    GFR calc Af Amer 19 (*)    All other components within normal limits  AMMONIA - Abnormal; Notable for the following:    Ammonia 81 (*)    All other components within normal limits  HEPATIC FUNCTION PANEL - Abnormal; Notable for the following:    Total Protein 5.9 (*)    Albumin 2.9 (*)    AST 93 (*)    ALT 67 (*)    Total Bilirubin 2.1 (*)    Bilirubin, Direct 0.9 (*)    Indirect Bilirubin 1.2 (*)    All other components within normal limits  GLUCOSE, CAPILLARY - Abnormal; Notable for the following:    Glucose-Capillary 230 (*)    All other components within normal limits  GLUCOSE, CAPILLARY - Abnormal; Notable for the following:    Glucose-Capillary 180 (*)    All other components within normal limits  MAGNESIUM  I-STAT TROPOININ, ED  I-STAT TROPOININ, ED    EKG  EKG Interpretation  Date/Time:  Monday November 21 2015 10:39:43 EDT Ventricular Rate:  125 PR Interval:    QRS Duration: 214 QT Interval:  357 QTC Calculation: 539 R Axis:   0 Text Interpretation:  Atrial fibrillation LVH with secondary repolarization abnormality Anterior infarct, acute (LAD) Prolonged QT interval Abnormal ekg Confirmed by Audie Pinto  MD, Laureen Frederic ([id number]) on 10/28/2015 10:52:20 AM       Radiology Dg Chest 2 View  Result Date: 10/29/2015 CLINICAL DATA:  Short of breath. Dizziness. Disoriented. Recent waking. Hospice patient. EXAM: CHEST  2 VIEW COMPARISON:  06/16/2015 FINDINGS: Lateral view degraded by patient arm position. Moderate thoracic spondylosis. Midline trachea. Moderate  cardiomegaly. Prior median sternotomy. No pleural effusion or pneumothorax. Mild pulmonary interstitial thickening is not significantly changed. No lobar consolidation. IMPRESSION: Cardiomegaly with suspicion of mild pulmonary venous congestion. Electronically Signed   By: Abigail Miyamoto M.D.   On: 11/13/2015 12:02   Dg Abd 1 View  Result Date: 11/08/2015 CLINICAL DATA:  Nausea, vomiting, dizziness since Friday. EXAM: ABDOMEN - 1 VIEW COMPARISON:  PET CT 05/17/2015 FINDINGS: Postoperative changes in the lumbar spine. Nonobstructive bowel gas pattern. No free air organomegaly. No suspicious calcification. IMPRESSION: No acute findings. Electronically Signed   By: Rolm Baptise M.D.   On: 11/14/2015 12:02    Procedures Procedures (including critical care time)  Medications Ordered in ED Medications  lactulose (CHRONULAC) 10 GM/15ML solution 30 g (not administered)  furosemide (LASIX) injection 80 mg (80 mg Intravenous Given 11/22/15 0752)  amiodarone (PACERONE) tablet 200 mg (200 mg Oral Given 11/13/2015 2208)  HYDROcodone-acetaminophen (NORCO/VICODIN) 5-325 MG per tablet 1 tablet (not administered)  pantoprazole (PROTONIX) EC tablet 40 mg (not administered)  gabapentin (NEURONTIN) capsule 200 mg (200 mg Oral Given 10/28/2015 2208)  mometasone-formoterol (DULERA) 200-5 MCG/ACT inhaler 2 puff (2 puffs Inhalation Not Given 11/22/15 0836)  apixaban (ELIQUIS) tablet 2.5 mg (2.5 mg Oral Given 11/20/2015 2209)  albuterol (PROVENTIL) (2.5 MG/3ML) 0.083% nebulizer solution 2.5 mg (not administered)  nitroGLYCERIN (NITROSTAT) SL tablet 0.4 mg (not administered)  diphenhydrAMINE (  BENADRYL) tablet 25 mg (25 mg Oral Given 11/22/15 0137)  sodium chloride flush (NS) 0.9 % injection 3 mL (3 mLs Intravenous Not Given 11/22/15 0813)  sodium chloride flush (NS) 0.9 % injection 3 mL (not administered)  0.9 %  sodium chloride infusion (not administered)  acetaminophen (TYLENOL) tablet 650 mg (not administered)  ondansetron  (ZOFRAN) injection 4 mg (not administered)  insulin aspart (novoLOG) injection 0-9 Units (2 Units Subcutaneous Given 11/22/15 0753)  insulin aspart (novoLOG) injection 0-5 Units (2 Units Subcutaneous Given 11/08/2015 2209)  temazepam (RESTORIL) capsule 15 mg (not administered)  ondansetron (ZOFRAN) injection 4 mg (4 mg Intravenous Given 10/28/2015 1233)  furosemide (LASIX) injection 60 mg (60 mg Intravenous Given 11/09/2015 1440)     Initial Impression / Assessment and Plan / ED Course  I have reviewed the triage vital signs and the nursing notes.  Pertinent labs & imaging results that were available during my care of the patient were reviewed by me and considered in my medical decision making (see chart for details).  Clinical Course      Final Clinical Impressions(s) / ED Diagnoses   Final diagnoses:  Vomiting    New Prescriptions Current Discharge Medication List       Leonard Schwartz, MD 11/22/15 1013

## 2015-11-21 NOTE — ED Notes (Signed)
Pt. sts that he is hallucinating. No dangerous ideations but family sts that this has been happening since Friday

## 2015-11-21 NOTE — Progress Notes (Signed)
[facility]RN Visit  This is an active hospice patient of [facility].  Patient presented to the ED, after c/o of dizziness, confusion and N/V, since Friday.  He also has a reported 8 lb weight gain over the past week.  Visited patient in room, with wife at bedside.  Patient lethargic and confused.  His HR is irregular and ranging from 120s-160s on the monitor.  Patient's wife reports the MD will be obtaining blood work and run some tests to see if he will need to be admitted.    Updated [facility] medication list given to pharmacy tech to review and update chart appropriately.  Left contact information if any needs arise.  Patient and family denied any hospice needs.  Please contact with any questions.  Thank you, Hessie KnowsStacie Wilkinson RN, BSN [facility] Hospital Liaison [phone]

## 2015-11-22 LAB — GLUCOSE, CAPILLARY
Glucose-Capillary: 180 mg/dL — ABNORMAL HIGH (ref 65–99)
Glucose-Capillary: 153 mg/dL — ABNORMAL HIGH (ref 65–99)
Glucose-Capillary: 184 mg/dL — ABNORMAL HIGH (ref 65–99)
Glucose-Capillary: 176 mg/dL — ABNORMAL HIGH (ref 65–99)

## 2015-11-22 LAB — HEPATIC FUNCTION PANEL
ALT: 67 U/L — ABNORMAL HIGH (ref 17–63)
AST: 93 U/L — AB (ref 15–41)
Albumin: 2.9 g/dL — ABNORMAL LOW (ref 3.5–5.0)
Alkaline Phosphatase: 98 U/L (ref 38–126)
BILIRUBIN DIRECT: 0.9 mg/dL — AB (ref 0.1–0.5)
BILIRUBIN INDIRECT: 1.2 mg/dL — AB (ref 0.3–0.9)
Total Bilirubin: 2.1 mg/dL — ABNORMAL HIGH (ref 0.3–1.2)
Total Protein: 5.9 g/dL — ABNORMAL LOW (ref 6.5–8.1)

## 2015-11-22 LAB — BASIC METABOLIC PANEL
ANION GAP: 13 (ref 5–15)
BUN: 130 mg/dL — ABNORMAL HIGH (ref 6–20)
CALCIUM: 9.1 mg/dL (ref 8.9–10.3)
CO2: 20 mmol/L — AB (ref 22–32)
CREATININE: 3.29 mg/dL — AB (ref 0.61–1.24)
Chloride: 100 mmol/L — ABNORMAL LOW (ref 101–111)
GFR calc Af Amer: 19 mL/min — ABNORMAL LOW (ref >60)
GFR calc non Af Amer: 16 mL/min — ABNORMAL LOW (ref >60)
GLUCOSE: 185 mg/dL — AB (ref 65–99)
Potassium: 5.2 mmol/L — ABNORMAL HIGH (ref 3.5–5.1)
Sodium: 133 mmol/L — ABNORMAL LOW (ref 135–145)

## 2015-11-22 LAB — AMMONIA: AMMONIA: 81 umol/L — AB (ref 9–35)

## 2015-11-22 MED ORDER — TEMAZEPAM 15 MG PO CAPS
15.0000 mg | ORAL_CAPSULE | Freq: Every evening | ORAL | Status: DC | PRN
  Administered 2015-11-22: 15 mg via ORAL
  Filled 2015-11-22: qty 1

## 2015-11-22 MED ORDER — ONDANSETRON HCL 4 MG/2ML IJ SOLN
4.0000 mg | INTRAMUSCULAR | Status: AC
  Administered 2015-11-22: 4 mg via INTRAVENOUS

## 2015-11-22 NOTE — Care Management Note (Signed)
Case Management Note  Patient Details  Name: [name] MRN: [id number] Date of Birth: 29-Mar-1932  Subjective/Objective:    Admitted with CHF                Action/Plan: [facility]. CM following for possible transition to Inpt Hospice Facility  Expected Discharge Date:    possible 09-01-2015              Expected Discharge Plan:  IP Rehab Facility  In-House Referral:  Clinical Social Work  Discharge planning Services  CM Consult  Status of Service:  In process, will continue to follow  Reola MosherChandler, Sasuke Yaffe L, RN,MHA,BSN [phone] 11/22/2015, 10:16 AM

## 2015-11-22 NOTE — Consult Note (Signed)
   [facility]   11/22/2015  [name] [birth year] [id number]  Patient screened for potential [facility] Care Management services for post hospital follow up for HF exacerbation. [facility] is [facility] services under patient's Medicare  Plan and primary care provider listed as Dr. Johny BlamerWilliam Harris. Chart review reveals the patient is a 82 y.o.male with complicated medical history including CAD with CABG 1994, Chronic systolic HF Echo 4/09/812/22/17 LVEF 19-14%30-35%, Chronic Atrial fibrillation.  The chart also reveals that the patient is active [facility] in which he is receiving full services.  [facility] will not be appropriate at this time for community follow up.    Please contact for questions:   Charlesetta ShanksVictoria Demarko Zeimet, RN BSN CCM [facility] Hospital Liaison  [phone] business mobile phone Toll free office [phone]

## 2015-11-22 NOTE — Progress Notes (Signed)
Advanced Heart Failure Rounding Note  Referring Physician:R. Radford Pax MD Primary Care: [name], MD Primary Cardiologist: Nanetta Batty, MD HF: Dr. Gala Romney   Reason for Consultation:  A/C Systolic HF - End Stage  Subjective:    Continues with ARF and Hepatic encephalopathy. Poor urine output.  Initially very somnolent/lethargic this am, would rouse to loud stimuli and then fall back asleep. But now alert and oriented in bed.  Got up to pee but was very "wobbly".   Denies dyspnea.  Still having lots of leg pain.   K 5.2. Creatinine 3.29. BUN 130. Ammonia 81  Objective:   Weight Range: 171 lb 11.2 oz (77.9 kg) Body mass index is 24.64 kg/m.   Vital Signs:   Temp:  [97.7 F (36.5 C)-98.7 F (37.1 C)] 98.7 F (37.1 C) (08/29 0617) Pulse Rate:  [110-132] 115 (08/29 0617) Resp:  [14-18] 18 (08/29 0617) BP: (90-107)/(65-85) 91/67 (08/29 0617) SpO2:  [96 %-100 %] 100 % (08/29 0617) Weight:  [170 lb 14.4 oz (77.5 kg)-171 lb 11.2 oz (77.9 kg)] 171 lb 11.2 oz (77.9 kg) (08/29 0617) Last BM Date: 11/19/15  Weight change: Filed Weights   11/09/2015 1813 11/22/15 0617  Weight: 170 lb 14.4 oz (77.5 kg) 171 lb 11.2 oz (77.9 kg)    Intake/Output:   Intake/Output Summary (Last 24 hours) at 11/22/15 1117 Last data filed at 11/22/15 1053  Gross per 24 hour  Intake              522 ml  Output              550 ml  Net              -28 ml     Physical Exam: General:  Elderly and chronically ill appearing. Somnolent at times. HEENT: normal Neck: supple. JVP elevated to jaw. Carotids 2+ bilat; no bruits. No lymphadenopathy or thyromegaly appreciated. Cor: PMI nondisplaced. Irregularly irregular. No M/G/R Lungs: Diminished basilar sounds.  Abdomen: soft, NT, mildly distended, no HSM. No bruits or masses. +BS  Extremities: no cyanosis, clubbing, rash, edema Neuro: alert & orientedx3, cranial nerves grossly intact. moves all 4 extremities w/o difficulty. Affect  pleasant   Telemetry: Reviewed personally, Afib 110-120s  Labs: CBC  Recent Labs  10/25/2015 1102  WBC 6.9  NEUTROABS 5.6  HGB 12.7*  HCT 39.0  MCV 97.7  PLT 176   Basic Metabolic Panel  Recent Labs  11/22/2015 1102 11/22/15 0615  NA 133* 133*  K 4.5 5.2*  CL 99* 100*  CO2 20* 20*  GLUCOSE 227* 185*  BUN 128* 130*  CREATININE 3.17* 3.29*  CALCIUM 9.1 9.1  MG 2.0  --    Liver Function Tests  Recent Labs  11/13/2015 1102 11/22/15 0615  AST 100* 93*  ALT 65* 67*  ALKPHOS 118 98  BILITOT 2.1* 2.1*  PROT 6.2* 5.9*  ALBUMIN 3.3* 2.9*   No results for input(s): LIPASE, AMYLASE in the last 72 hours. Cardiac Enzymes No results for input(s): CKTOTAL, CKMB, CKMBINDEX, TROPONINI in the last 72 hours.  BNP: BNP (last 3 results)  Recent Labs  09/26/15 1155 10/27/15 1145 11/04/2015 1102  BNP 2,500.3* 3,644.3* 2,605.8*    ProBNP (last 3 results) No results for input(s): PROBNP in the last 8760 hours.   D-Dimer No results for input(s): DDIMER in the last 72 hours. Hemoglobin A1C No results for input(s): HGBA1C in the last 72 hours. Fasting Lipid Panel No results for input(s): CHOL, HDL,  LDLCALC, TRIG, CHOLHDL, LDLDIRECT in the last 72 hours. Thyroid Function Tests No results for input(s): TSH, T4TOTAL, T3FREE, THYROIDAB in the last 72 hours.  Invalid input(s): FREET3  Other results:     Imaging/Studies:  Dg Chest 2 View  Result Date: 2016-02-07 CLINICAL DATA:  Short of breath. Dizziness. Disoriented. Recent waking. Hospice patient. EXAM: CHEST  2 VIEW COMPARISON:  06/16/2015 FINDINGS: Lateral view degraded by patient arm position. Moderate thoracic spondylosis. Midline trachea. Moderate cardiomegaly. Prior median sternotomy. No pleural effusion or pneumothorax. Mild pulmonary interstitial thickening is not significantly changed. No lobar consolidation. IMPRESSION: Cardiomegaly with suspicion of mild pulmonary venous congestion. Electronically Signed   By:  Jeronimo GreavesKyle  Talbot M.D.   On: 02017-11-14 12:02   Dg Abd 1 View  Result Date: 2016-02-07 CLINICAL DATA:  Nausea, vomiting, dizziness since Friday. EXAM: ABDOMEN - 1 VIEW COMPARISON:  PET CT 05/17/2015 FINDINGS: Postoperative changes in the lumbar spine. Nonobstructive bowel gas pattern. No free air organomegaly. No suspicious calcification. IMPRESSION: No acute findings. Electronically Signed   By: Charlett NoseKevin  Dover M.D.   On: 02017-11-14 12:02     Latest Echo  Latest Cath   Medications:     Scheduled Medications: . amiodarone  200 mg Oral BID  . apixaban  2.5 mg Oral BID  . furosemide  80 mg Intravenous BID  . gabapentin  200 mg Oral BID  . insulin aspart  0-5 Units Subcutaneous QHS  . insulin aspart  0-9 Units Subcutaneous TID WC  . lactulose  30 g Oral BID  . mometasone-formoterol  2 puff Inhalation BID  . pantoprazole  40 mg Oral Daily  . sodium chloride flush  3 mL Intravenous Q12H     Infusions:     PRN Medications:  sodium chloride, acetaminophen, albuterol, diphenhydrAMINE, HYDROcodone-acetaminophen, nitroGLYCERIN, ondansetron (ZOFRAN) IV, sodium chloride flush, temazepam   Assessment   1. A/C combined systolic HF - Echo 08/2015 EF 20% 2. Chronic AF with Acute RVR 3. ARF on CKD stage IV 4. Hepatic Encephalopathy 5. CAD s/p CABG 1994 6. Hypotension 7. Neuropathy 8. Transaminitis    Plan    Stable symptomatically from yesterday, although with lethargy this morning.   ARF and hepatic encephalopathy continue to worsen.    Continue IV lasix 80 mg BID and lactulose 30 BID for now.  Off all non-essential meds.   His prognosis remains very poor. We recommend [facility] as likely prognosis is days to weeks at current trend.  Palliative care to see.   Length of Stay: 1  Graciella FreerMichael Andrew Tillery PA-C 11/22/2015, 11:17 AM  Advanced Heart Failure Team Pager [phone] (M-F; 7a - 4p)  Please contact [facility] for night-coverage after hours (4p -7a ) and weekends  on [email or web address]  Patient seen and examined with Otilio SaberAndy Tillery, PA-C. We discussed all aspects of the encounter. I agree with the assessment and plan as stated above.   He has grown increasingly weak. Poor response to IV lasix and renal function worsening. Agree with continuing attempts at IV diuresis. Continue low-dose lactulose as tolerated. Palliative Care team to follow. Prognosis could be days to weeks.   Bensimhon, Daniel,MD 12:08 AM

## 2015-11-22 NOTE — Progress Notes (Signed)
MC-3E-21  [facility]  This is a GIP, related and covered hospital admission of 2015/04/12 per Dr. Barbee ShropshireHertweck with a [facility] dx of Heart failure.  [facility] with dx of acute on chronic CHF.  Patient went to ED yesterday due to sx of increased lethargy, disorientation, 8 lb weight gain and nausea and vomiting over the past weekend.  Wife had not contacted [facility] over the weekend, however when RN made visit yesterday decision was made for patient to go the ED for evaluation.  Code status is DNR   Visit made to patient's room.  He is awake, but does appear weak/lethargic.  Wife, son and daughter in law are present.   Wife spoke very openly stating that the patient's condition is deteriorating rapidly and that she was told he is likely approaching end of life in less than two weeks.  We did discuss if she wanted him transferred to [facility]; however she states that if the patient has only days to live her preference  is for him to remain hospitalized; however if he has a few weeks she would then consider [facility].    Per chart review the patient has both elevated potassium and ammonia levels.  He has orders for Lasix 80 MG IV BID  - has received two doses so far. He is also on Lactulose -  Having received one dose.   Wife reports patient has had minimal results so far and only minimal urine output.  Heart rate 109 and irregular per chart review.  Patient dozing off during visit.    [facility] will continue to follow and assist with any discharge needs.   Please call with any hospice related questions or concerns.    Lavone NeriSusan Thomas, RN  [facility] Liaison  [phone]

## 2015-11-22 NOTE — Progress Notes (Signed)
Patient drowsy with family at bedside. Spoke with Md about elevated potassium and ammonia levels. Hold potassium and will meet with family to discuss prognosis.

## 2015-11-22 NOTE — Progress Notes (Signed)
HR increased during the night in the 110s, sustained, pt asleep at this time, easily aroused, stated he was dreaming, assisted pt to the bathroom with RW, will continue to monitor.

## 2015-11-23 DIAGNOSIS — R451 Restlessness and agitation: Secondary | ICD-10-CM

## 2015-11-23 DIAGNOSIS — Z515 Encounter for palliative care: Secondary | ICD-10-CM

## 2015-11-23 DIAGNOSIS — Z789 Other specified health status: Secondary | ICD-10-CM

## 2015-11-23 LAB — GLUCOSE, CAPILLARY: Glucose-Capillary: 155 mg/dL — ABNORMAL HIGH (ref 65–99)

## 2015-11-23 LAB — BASIC METABOLIC PANEL
Anion gap: 14 (ref 5–15)
BUN: 135 mg/dL — AB (ref 6–20)
CHLORIDE: 99 mmol/L — AB (ref 101–111)
CO2: 18 mmol/L — ABNORMAL LOW (ref 22–32)
Calcium: 9.1 mg/dL (ref 8.9–10.3)
Creatinine, Ser: 3.75 mg/dL — ABNORMAL HIGH (ref 0.61–1.24)
GFR, EST AFRICAN AMERICAN: 16 mL/min — AB (ref >60)
GFR, EST NON AFRICAN AMERICAN: 14 mL/min — AB (ref >60)
Glucose, Bld: 165 mg/dL — ABNORMAL HIGH (ref 65–99)
POTASSIUM: 5.1 mmol/L (ref 3.5–5.1)
SODIUM: 131 mmol/L — AB (ref 135–145)

## 2015-11-23 LAB — PROTIME-INR
INR: 4.31 — AB
PROTHROMBIN TIME: 42.5 s — AB (ref 11.4–15.2)

## 2015-11-23 MED ORDER — LORAZEPAM 2 MG/ML IJ SOLN
1.0000 mg | INTRAMUSCULAR | Status: DC | PRN
  Administered 2015-11-23: 1 mg via INTRAVENOUS
  Filled 2015-11-23: qty 1

## 2015-11-23 MED ORDER — LORAZEPAM 1 MG PO TABS: 1.0000 mg | ORAL_TABLET | ORAL | Status: DC | PRN

## 2015-11-23 MED ORDER — MORPHINE SULFATE (PF) 2 MG/ML IV SOLN: 1.0000 mg | INTRAVENOUS | Status: DC | PRN

## 2015-11-23 MED ORDER — DEXTROSE 5 % IV SOLN
5.0000 mg/h | INTRAVENOUS | Status: DC
  Administered 2015-11-23: 5 mg/h via INTRAVENOUS
  Filled 2015-11-23: qty 10

## 2015-11-23 MED ORDER — ONDANSETRON 4 MG PO TBDP: 4.0000 mg | ORAL_TABLET | Freq: Four times a day (QID) | ORAL | Status: DC | PRN

## 2015-11-23 MED ORDER — MORPHINE SULFATE (PF) 4 MG/ML IV SOLN
4.0000 mg | Freq: Once | INTRAVENOUS | Status: DC
  Filled 2015-11-23: qty 1

## 2015-11-23 MED ORDER — HALOPERIDOL 0.5 MG PO TABS
0.5000 mg | ORAL_TABLET | ORAL | Status: DC | PRN
  Filled 2015-11-23: qty 1

## 2015-11-23 MED ORDER — ACETAMINOPHEN 325 MG PO TABS: 650.0000 mg | ORAL_TABLET | Freq: Four times a day (QID) | ORAL | Status: DC | PRN

## 2015-11-23 MED ORDER — GLYCOPYRROLATE 0.2 MG/ML IJ SOLN
0.2000 mg | INTRAMUSCULAR | Status: DC | PRN
  Filled 2015-11-23: qty 1

## 2015-11-23 MED ORDER — ONDANSETRON HCL 4 MG/2ML IJ SOLN: 4.0000 mg | Freq: Four times a day (QID) | INTRAMUSCULAR | Status: DC | PRN

## 2015-11-23 MED ORDER — LORAZEPAM 1 MG PO TABS
1.0000 mg | ORAL_TABLET | ORAL | Status: DC | PRN
Start: 2015-11-23 — End: 2015-11-23

## 2015-11-23 MED ORDER — SODIUM CHLORIDE 0.9% FLUSH
3.0000 mL | Freq: Two times a day (BID) | INTRAVENOUS | Status: DC
  Administered 2015-11-23: 3 mL via INTRAVENOUS

## 2015-11-23 MED ORDER — LORAZEPAM 2 MG/ML PO CONC: 1.0000 mg | ORAL | Status: DC | PRN

## 2015-11-23 MED ORDER — HALOPERIDOL LACTATE 5 MG/ML IJ SOLN: 0.5000 mg | INTRAMUSCULAR | Status: DC | PRN

## 2015-11-23 MED ORDER — SODIUM CHLORIDE 0.9% FLUSH: 3.0000 mL | INTRAVENOUS | Status: DC | PRN

## 2015-11-23 MED ORDER — DIPHENHYDRAMINE HCL 50 MG/ML IJ SOLN: 12.5000 mg | INTRAMUSCULAR | Status: DC | PRN

## 2015-11-23 MED ORDER — SODIUM CHLORIDE 0.9 % IV SOLN: 250.0000 mL | INTRAVENOUS | Status: DC | PRN

## 2015-11-23 MED ORDER — LORAZEPAM 2 MG/ML IJ SOLN
1.0000 mg | INTRAMUSCULAR | Status: DC | PRN
Start: 2015-11-23 — End: 2015-11-24
  Administered 2015-11-23: 1 mg via INTRAVENOUS
  Filled 2015-11-23: qty 1

## 2015-11-23 MED ORDER — ACETAMINOPHEN 650 MG RE SUPP: 650.0000 mg | Freq: Four times a day (QID) | RECTAL | Status: DC | PRN

## 2015-11-23 MED ORDER — GLYCOPYRROLATE 1 MG PO TABS
1.0000 mg | ORAL_TABLET | ORAL | Status: DC | PRN
  Filled 2015-11-23: qty 1

## 2015-11-23 MED ORDER — HALOPERIDOL LACTATE 2 MG/ML PO CONC
0.5000 mg | ORAL | Status: DC | PRN
  Filled 2015-11-23: qty 0.3

## 2015-11-23 NOTE — Progress Notes (Signed)
MC 3E-21-[facility] [place] Visit  This is a GIP, related and covered hospital admission of 10/26/2015 per Dr. Tomasa Hosteller with a [facility] dx of Heart failure.  Patient is admitted to [facility] with dx of acute on chronic CHF.  Patient went to ED yesterday due to sx of increased lethargy, disorientation, 8 lb weight gain and nausea and vomiting over the past weekend.  Wife had not contacted [facility] over the weekend, however when RN made visit yesterday decision was made for patient to go the ED for evaluation.  Code status is DNR.  Visited patient in room, with family at bedside.  Patient sleeping soundly, and did not arouse to verbal stimuli.  Family reports patient has been somnolent this morning, however, has experienced intermittent episodes of agitation.  Patient has not had any po intake x days. He is currently on 3L [place] with normal respirations noted, and appears in no acute distress. Per chart review, patient received 12.5 mg of Benadryl last night for sleep. Family verbalized they met with PMT this morning and have agreed to comfort measures only.  The plan at this time is to transfer to [facility] for comfort care.  Family wishes for patient to stay in hospital if he has limited days.  [facility] will continue to follow daily.  Appreciate PMT support with this case.  Please contact with any hospice-related questions or concerns.  Thank you, Freddi Starr RN, [facility] Liaison [phone]

## 2015-11-23 NOTE — Progress Notes (Signed)
Pt resting in bed comfortably, family at bedside.

## 2015-11-23 NOTE — Progress Notes (Signed)
Advanced Heart Failure Rounding Note  Referring Physician:R. Audie Pinto MD Primary Care: Shirline Frees, MD Primary Cardiologist: Quay Burow, MD HF: Dr. Haroldine Laws   Reason for Consultation:  A/C Systolic HF - End Stage  Subjective:    Continues with ARF and Hepatic encephalopathy. Continues with poor urine output.   Pt is uremic and encephalopathic this am with restlessness and confusion.   Family has met with palliative and pt is now comfort care.   K 5.1. Creatinine 3.75. BUN 135.  Objective:   Weight Range: 173 lb 6.4 oz (78.7 kg) Body mass index is 24.88 kg/m.   Vital Signs:   Pulse Rate:  [99-116] 99 (08/30 0500) Resp:  [18] 18 (08/30 0500) BP: (92-95)/(64-67) 95/64 (08/30 0500) SpO2:  [98 %-100 %] 99 % (08/30 0500) Weight:  [173 lb 6.4 oz (78.7 kg)] 173 lb 6.4 oz (78.7 kg) (08/30 0633) Last BM Date: 11/19/15  Weight change: Filed Weights   11/04/2015 1813 11/22/15 0617 11/23/15 4970  Weight: 170 lb 14.4 oz (77.5 kg) 171 lb 11.2 oz (77.9 kg) 173 lb 6.4 oz (78.7 kg)    Intake/Output:   Intake/Output Summary (Last 24 hours) at 11/23/15 1027 Last data filed at 11/23/15 0900  Gross per 24 hour  Intake              720 ml  Output              430 ml  Net              290 ml     Physical Exam: General:  Lethargic Some agitation. Arouses slightly  No resp distress HEENT: normal Neck: supple. JVP 14 cm + Carotids 2+ bilat; no bruits. No lymphadenopathy or thyromegaly appreciated. Cor: PMI nondisplaced. Irregularly irregular. No M/G/R Lungs: nearly agonal  Abdomen: soft, NT, mildly distended, no HSM. No bruits or masses. +BS  Extremities: no cyanosis, clubbing, rash, edema Neuro: lethargic and somnolent with some agitation   Telemetry: Reviewed, Afib 110s  Labs: CBC  Recent Labs  10/30/2015 1102  WBC 6.9  NEUTROABS 5.6  HGB 12.7*  HCT 39.0  MCV 97.7  PLT 263   Basic Metabolic Panel  Recent Labs  11/04/2015 1102 11/22/15 0615 11/23/15 0259    NA 133* 133* 131*  K 4.5 5.2* 5.1  CL 99* 100* 99*  CO2 20* 20* 18*  GLUCOSE 227* 185* 165*  BUN 128* 130* 135*  CREATININE 3.17* 3.29* 3.75*  CALCIUM 9.1 9.1 9.1  MG 2.0  --   --    Liver Function Tests  Recent Labs  11/23/2015 1102 11/22/15 0615  AST 100* 93*  ALT 65* 67*  ALKPHOS 118 98  BILITOT 2.1* 2.1*  PROT 6.2* 5.9*  ALBUMIN 3.3* 2.9*   No results for input(s): LIPASE, AMYLASE in the last 72 hours. Cardiac Enzymes No results for input(s): CKTOTAL, CKMB, CKMBINDEX, TROPONINI in the last 72 hours.  BNP: BNP (last 3 results)  Recent Labs  09/26/15 1155 10/27/15 1145 11/14/2015 1102  BNP 2,500.3* 3,644.3* 2,605.8*    ProBNP (last 3 results) No results for input(s): PROBNP in the last 8760 hours.   D-Dimer No results for input(s): DDIMER in the last 72 hours. Hemoglobin A1C No results for input(s): HGBA1C in the last 72 hours. Fasting Lipid Panel No results for input(s): CHOL, HDL, LDLCALC, TRIG, CHOLHDL, LDLDIRECT in the last 72 hours. Thyroid Function Tests No results for input(s): TSH, T4TOTAL, T3FREE, THYROIDAB in the last 72 hours.  Invalid input(s): FREET3  Other results:     Imaging/Studies:  Dg Chest 2 View  Result Date: 11/01/2015 CLINICAL DATA:  Short of breath. Dizziness. Disoriented. Recent waking. Hospice patient. EXAM: CHEST  2 VIEW COMPARISON:  06/16/2015 FINDINGS: Lateral view degraded by patient arm position. Moderate thoracic spondylosis. Midline trachea. Moderate cardiomegaly. Prior median sternotomy. No pleural effusion or pneumothorax. Mild pulmonary interstitial thickening is not significantly changed. No lobar consolidation. IMPRESSION: Cardiomegaly with suspicion of mild pulmonary venous congestion. Electronically Signed   By: Abigail Miyamoto M.D.   On: 11/08/2015 12:02   Dg Abd 1 View  Result Date: 11/03/2015 CLINICAL DATA:  Nausea, vomiting, dizziness since Friday. EXAM: ABDOMEN - 1 VIEW COMPARISON:  PET CT 05/17/2015 FINDINGS:  Postoperative changes in the lumbar spine. Nonobstructive bowel gas pattern. No free air organomegaly. No suspicious calcification. IMPRESSION: No acute findings. Electronically Signed   By: Rolm Baptise M.D.   On: 11/01/2015 12:02    Latest Echo  Latest Cath   Medications:     Scheduled Medications: . furosemide  80 mg Intravenous BID  . gabapentin  200 mg Oral BID  . sodium chloride flush  3 mL Intravenous Q12H    Infusions:    PRN Medications: sodium chloride, acetaminophen **OR** acetaminophen, albuterol, diphenhydrAMINE, glycopyrrolate **OR** glycopyrrolate **OR** glycopyrrolate, haloperidol **OR** haloperidol **OR** haloperidol lactate, HYDROcodone-acetaminophen, LORazepam **OR** LORazepam **OR** LORazepam, morphine injection, nitroGLYCERIN, ondansetron **OR** ondansetron (ZOFRAN) IV, sodium chloride flush, temazepam   Assessment   1. A/C combined systolic HF - Echo 06/8590 EF 20% 2. Chronic AF with Acute RVR 3. ARF on CKD stage IV 4. Hepatic Encephalopathy 5. CAD s/p CABG 1994 6. Hypotension 7. Neuropathy 8. Transaminitis    Plan    Pt has worsening uremia and hepatic encephalopathy. Now comfort care.   Continue IV lasix for palliation.  Stop lactulose. Stop all non-essentials.   His prognosis continues to worsen.  Now anticipating hospital death, possible as soon as 24-48 hours.   Appreciate palliative input.    Now on intermittent comfort meds.  Would continue to up titrate as needed.  Length of Stay: 2  Shirley Friar PA-C 11/23/2015, 10:27 AM  Advanced Heart Failure Team Pager [phone] (M-F; 7a - 4p)  Please contact [facility] for night-coverage after hours (4p -7a ) and weekends on [email or web address]     Patient seen and examined with Oda Kilts, PA-C. We discussed all aspects of the encounter. I agree with the assessment and plan as stated above.   He is now terminally ill and being switched to comfort care. Will start morphine gtt for  comfort. D/w family and Dr. Gwenlyn Found. In-hospital death is imminent.   Seri Kimmer,MD 4:11 PM

## 2015-11-23 NOTE — Consult Note (Signed)
Consultation Note Date: 11/23/2015   Patient Name: [name]  DOB: 1932-11-21  MRN: [id number]  Age / Sex: 82 y.o., male  PCP: Shirline Frees, MD Referring Physician: Jolaine Artist, MD  Reason for Consultation: Disposition, Inpatient hospice referral, Non pain symptom management, Terminal Care and Withdrawal of life-sustaining treatment  HPI/Patient Profile: 82 y.o. male  with past medical history of CAD (s/p CABG 1994), chronic sHF (EF 30-35%), chronic Afib, presented to ED w/dizziness, confusion, N/V admitted on 11/09/2015 as GIP Hospice admission with end stage CHF, ARF and encephalopathy.   Clinical Assessment and Goals of Care: Met with patient and family at bedside. Patient being followed by [facility]. Wife was surprised at quick decline, however, chart review reveals that patient has had recent signs of decline including: falls, hallucinations, decreased appetite. Discussed with family that patient is actively dying. They report he has been somnolent with intermittent periods of agitation. They desire comfort measures only. Discussed what comfort measures entail and family agrees this is best for patient at this time. Wife states she does not want to go to [facility] if he has limited days to live. Patient's son and daughter in law agree.  Wife reports that patient clearly stated he did not want to go to [facility]. Although patient is dying, it is difficult to determine length of time. My best prognosis is days to a week.   NEXT OF KIN- spouse, Dianne    SUMMARY OF RECOMMENDATIONS -Transition to comfort measures only -Recommend transfer to 6N unit if possible -Family resistant to residential hospice, but will continue to discuss    Code Status/Advance Care Planning:  DNR    Symptom Management:   Comfort measures:   Morphine '1mg'$  IV PRN SOB, Pain  Ativan IV PRN anxiety  Haldol  IV PRN agitation  See comfort measure order set  Palliative Prophylaxis:   Delirium Protocol, Frequent Pain Assessment, Oral Care, Palliative Wound Care and Turn Reposition  Additional Recommendations (Limitations, Scope, Preferences):  Full Comfort Care, Initiate Comfort Feeding, No Diagnostics, No Glucose Monitoring, No Hemodialysis and No IV Fluids  Psycho-social/Spiritual:   Desire for further Chaplaincy support:No- receiving support from home church Additional Recommendations:  Prognosis:   Days to week based on patient's end stage CHF, ARF, encephalopathy, family desire for comfort measures only  Discharge Planning: Anticipated Hospital Death      Primary Diagnoses: Present on Admission: . Acute on chronic systolic (congestive) heart failure ([place])   I have reviewed the medical record, interviewed the patient and family, and examined the patient. The following aspects are pertinent.  Past Medical History:  Diagnosis Date  . Angina at [facility]) 02/27/2011  . Arthritis   . Blood transfusion    no reaction to transfusion per patient  . CAD (coronary artery disease) 02/27/2011   Catheterized on 02/28/11, graft supplying marginal vessel was occluded, after 48 hours of hydration, a repeat cath was performed in an attempt at PCI 03/02/11 cath had an unsuccessful attempt at PCI to an occluded  calcified circumflex vessel, Residual distal tip of the ChoICE PT moderate support wire at the subtotal occlusion. No change in the subtotal occlusion,improved retrograde collateralization  . CAD (coronary artery disease)    PTCA of the right coronary artery in Feb. 1987, July 1987, and June 1990. Circumflex PTCA in July 1993, followed by a RCA, PTCA in Sept. 1994. CABG in 1994x5 with a LIMA to the LAD, vein graft to the first diagonal, vein graft to the marginal and left circumflex coronary artery, and sequential graft to the PDA and PLA branches of the RCA  . carotid doppler    Left ECA :  The proximal most aspect of the left external carotid artery demonstrates a peak systolic velocity of 932.3 cm/s. This is consistent with a 70-99% significant stenosis correlated with a 2007 angiogram.  . CHF (congestive heart failure) ([place])   . CKD (chronic kidney disease) stage 3, GFR 30-59 ml/min 02/27/2011  . Deficiency anemia 08/05/2015  . Diabetes mellitus 02/27/2011   insulin dependent  . GERD (gastroesophageal reflux disease)   . HTN (hypertension)    Renal dopplers have been performed on a semi annual basis. Left renal artery demonstrated vessel narrowing with elevated velocities consistent with a 1-59% diameter reduction.   . Hyperlipemia 02/27/2011  . Hypertension   . LBBB (left bundle branch block) 02/27/2011  . LV dysfunction 02/27/2011  . Myocardial infarction ([place])   . On home oxygen therapy    "3L; 24/7" (11/08/2015)  . Paroxysmal atrial fibrillation (H[facility])   . PVD (peripheral vascular disease) ([place])    Carotid Angiography was performed in 2007, no treatment delivered at that time. Right carotid endarterectomy in 2009, being followed by duplex ultrasound. Doppler remains stable.   Social History   Social History  . Marital status: Married    Spouse name: N/A  . Number of children: N/A  . Years of education: N/A   Social History Main Topics  . Smoking status: Former Smoker    Years: 22.00    Quit date: 03/26/1958  . Smokeless tobacco: Never Used     Comment: quit smoking in the 1960's  . Alcohol use No  . Drug use: No  . Sexual activity: Yes   Other Topics Concern  . None   Social History Narrative  . None   Family History  Problem Relation Age of Onset  . Cancer Mother     unknown to pt  . Cancer Father     pancreas  . Cancer Paternal Grandfather     prostate   Scheduled Meds: . amiodarone  200 mg Oral BID  . apixaban  2.5 mg Oral BID  . furosemide  80 mg Intravenous BID  . gabapentin  200 mg Oral BID  . insulin aspart  0-5 Units Subcutaneous QHS  .  insulin aspart  0-9 Units Subcutaneous TID WC  . lactulose  30 g Oral BID  . mometasone-formoterol  2 puff Inhalation BID  . pantoprazole  40 mg Oral Daily  . sodium chloride flush  3 mL Intravenous Q12H   Continuous Infusions:  PRN Meds:.sodium chloride, acetaminophen, albuterol, diphenhydrAMINE, HYDROcodone-acetaminophen, nitroGLYCERIN, ondansetron (ZOFRAN) IV, sodium chloride flush, temazepam Medications Prior to Admission:  Prior to Admission medications   Medication Sig Start Date End Date Taking? Authorizing Provider  apixaban (ELIQUIS) 2.5 MG TABS tablet Take 1 tablet (2.5 mg total) by mouth 2 (two) times daily. 09/13/15  Yes Lorretta Harp, MD  carvedilol (COREG) 3.125 MG tablet Take 1 tablet (  3.125 mg total) by mouth 2 (two) times daily with a meal. 11/08/15  Yes Shirley Friar, PA-C  diphenhydrAMINE (BENADRYL) 25 MG tablet Take 25 mg by mouth daily.   Yes Historical Provider, MD  gabapentin (NEURONTIN) 100 MG capsule Take 2 capsules (200 mg total) by mouth 2 (two) times daily. 11/16/15  Yes Shirley Friar, PA-C  HYDROcodone-acetaminophen (NORCO/VICODIN) 5-325 MG tablet Take 1 tablet by mouth every 6 (six) hours as needed for moderate pain.   Yes Historical Provider, MD  metolazone (ZAROXOLYN) 2.5 MG tablet Take 1 tablet (2.5 mg total) by mouth as needed (5 pound weight gain in 24 hours). 09/12/15  Yes Amy D Clegg, NP  mometasone-formoterol (DULERA) 200-5 MCG/ACT AERO Inhale 2 puffs into the lungs 2 (two) times daily. 09/13/15  Yes Lorretta Harp, MD  omeprazole (PRILOSEC) 20 MG capsule Take 20 mg by mouth 2 (two) times daily before a meal.   Yes Historical Provider, MD  potassium chloride SA (K-DUR,KLOR-CON) 20 MEQ tablet Take 20 mEq by mouth daily.   Yes Historical Provider, MD  temazepam (RESTORIL) 30 MG capsule Take 30 mg by mouth at bedtime as needed for sleep.   Yes Historical Provider, MD  torsemide (DEMADEX) 20 MG tablet Take 2 tablets (40 mg total) by mouth 2  (two) times daily. 09/12/15  Yes Amy D Clegg, NP  albuterol (PROVENTIL HFA;VENTOLIN HFA) 108 (90 Base) MCG/ACT inhaler Inhale 2 puffs into the lungs every 6 (six) hours as needed for wheezing or shortness of breath. 06/16/15   Jose Shirl Harris, MD  glucose monitoring kit (FREESTYLE) monitoring kit 1 each by Does not apply route as needed for other. Patient not taking: Reported on 11/16/2015 08/14/15   Drenda Freeze, MD  nitroGLYCERIN (NITROSTAT) 0.4 MG SL tablet Place 0.4 mg under the tongue every 5 (five) minutes as needed for chest pain. Reported on 09/08/2015    Historical Provider, MD   Allergies  Allergen Reactions  . Lisinopril Cough   Review of Systems  Unable to perform ROS: Mental status change    Physical Exam  Constitutional: No distress.  HENT:  Head: Normocephalic and atraumatic.  Cardiovascular: An irregular rhythm present. Tachycardia present.   Distal pulses weak  Pulmonary/Chest: Effort normal and breath sounds normal.  Abdominal: Soft. Bowel sounds are normal.  Neurological:  Agitated at times, disoriented  Skin: Skin is warm and dry.    Vital Signs: BP 95/64 (BP Location: Right Arm)   Pulse 99   Temp 98.7 F (37.1 C) (Oral)   Resp 18   Ht '5\' 10"'$  (1.778 m)   Wt 78.7 kg (173 lb 6.4 oz) Comment: scale b  SpO2 99%   BMI 24.88 kg/m  Pain Assessment: No/denies pain   Pain Score: 0-No pain   SpO2: SpO2: 99 % O2 Device:SpO2: 99 % O2 Flow Rate: .O2 Flow Rate (L/min): 3 L/min  IO: Intake/output summary:  Intake/Output Summary (Last 24 hours) at 11/23/15 0917 Last data filed at 11/23/15 0651  Gross per 24 hour  Intake              720 ml  Output              430 ml  Net              290 ml    LBM: Last BM Date: 11/19/15 Baseline Weight: Weight: 77.5 kg (170 lb 14.4 oz) Most recent weight: Weight: 78.7 kg (173 lb 6.4 oz) (  scale b)     Palliative Assessment/Data: PPS: 10%   Flowsheet Rows   Flowsheet Row Most Recent Value  Intake Tab  Referral  Department  Cardiology  Unit at Time of Referral  Cardiac/Telemetry Unit  Palliative Care Primary Diagnosis  Cardiac  Date Notified  11/16/2015  Palliative Care Type  New Palliative care  Reason for referral  Clarify Goals of Care  Date of Admission  11/10/2015  # of days IP prior to Palliative referral  0  Clinical Assessment  Psychosocial & Spiritual Assessment  Palliative Care Outcomes     Thank you for this consult. Palliative Care will continue to monitor and assist with terminal care.  Time AF:[id number] Time Out: 0945 Time Total: 75 min Greater than 50%  of this time was spent counseling and coordinating care related to the above assessment and plan.  Signed by: Mariana Kaufman, AGNP-C Palliative Medicine    Please contact Palliative Medicine Team phone at [phone] for questions and concerns.  For individual provider: See Shea Evans

## 2015-11-23 NOTE — Progress Notes (Signed)
Home Care SW made visit with Home Care RN.  Multiple family were present.  Patient got restless at times but seemed comfortable.  SW and RN provided education on end of life.  Family stated if Patient was close to passing they wanted him to stay here at the hospital.  Palliative Care NP came in and talked to the family.  She told the family he may be moved to the 6th floor.  SW will continue to follow.  Support given to family.  Maretta LosMadara Shillinglaw, LCSW [facility] [phone]

## 2015-11-23 NOTE — Addendum Note (Signed)
Encounter addended by: Dolores Pattyaniel R Marleta Lapierre, MD on: 11/23/2015 11:32 PM<BR>    Actions taken: LOS modified, Sign clinical note

## 2015-11-23 NOTE — Progress Notes (Signed)
CRITICAL VALUE ALERT  Critical value received: PT 42.5  INR 4.31  Date of notification:  11/23/15  Time of notification:  04:04  Critical value read back:Yes.    Nurse who received alert:  Antonietta Breachristina Corrinne Benegas   MD notified (1st page): Corotto, MD  Time of first page:  04:08  MD notified (2nd page):  Time of second page:  Responding MD: Sandy Salaamorotto, MD  Time MD responded:04:08

## 2015-11-24 ENCOUNTER — Encounter (HOSPITAL_COMMUNITY): Payer: Self-pay | Admitting: *Deleted

## 2015-11-25 ENCOUNTER — Telehealth: Payer: Self-pay | Admitting: *Deleted

## 2015-11-25 NOTE — Progress Notes (Signed)
Death certificate completed and signed by Dr Gala RomneyBensimhon, given to funeral home

## 2015-11-25 NOTE — Discharge Summary (Signed)
  Advanced Heart Failure Death Summary  Death Summary   Patient ID: [name] MRN: [id number], DOB/AGE: 10/28/1932 82 y.o. Admit date: 08/23/2015 D/C date:     11/04/2015   Primary Discharge Diagnoses:  1. A/C combined systolic HF - Echo 08/2015 EF 20% 2. Chronic AF with Acute RVR 3. Acute renal failure on CKD stage IV 4. Acute on chronic liver failure with Hepatic Encephalopathy 5. CAD s/p CABG 1994 6. Hypotension 7. Neuropathy 8. Transaminitis 9. Hyperkalemia  Hospital Course:   [name] DullMarshall G Chamberswas a 82 y.o.male with complicated medical history including CAD with CABG 1994, Chronic systolic HF Echo 1/19/146/16/17 LVEF 78%20%, Chronic Atrial fibrillation on renally adjusted Eliquis, HLD, and CKD stage 4.  Had been followed in the HF clinic since 08/2015. On initial visit he was admitted from the HF clinic with marked volume overload and diuresed 21 lbs. We have followed closely in the clinic since then for follow up visits and medication adjustments.   Over the past month or so, patient has become increasingly intolerant of his medications due to hypotension, fatigue, and dizziness, requiring down-titration and eventual cessation of all of them leading up to this admission.   He continued on diuretics, though was having increasing poor response.   [facility] with overt kidney, liver, and heart failure indicative of the severity of his disease and poor prognosis.  Pt had been in "agony" by the end of the day over the past few weeks per his wife.  Family has also been dealing with hospice for another member.     Family and pt requested admission to the hospital for aggressive IV diuresis in attempts of palliation.  We admitted and also started lactulose with elevated ammonia and ongoing hepatic encephalopathy.  Unfortunately, patients creatinine and liver enzymes continued to worsen despite IV lasix. Pt had minimal urine output and became increasingly lethargic,  uremic, and uncomfortable. Decision was made on 11/23/15 to transition to full comfort care.   Pt moved to 6N with palliative care following.   Pt placed on morphine drip and appeared comfortable with no further distress.  Upon nursing check just after midnight, pt was found to be pulseless and apneic with family at bedside. Pronounced by RN at 00:15 10/27/2015.   Signed, Mariam DollarMichael Andrew Tillery PA-C 11/14/2015, 4:06 PM  Agree with above.   Bensimhon, Daniel,MD 11:20 PM

## 2015-11-25 NOTE — Telephone Encounter (Signed)
Hospice supplemental orders from 11/14/15 to 11/14/15 signed by Dr Allyson SabalBerry and faxed to number provided.

## 2015-11-25 NOTE — Progress Notes (Addendum)
RN went to patient's room to check, pt not breathing, and pulseless, called Renato ShinKaren Wall RN and confirmed that patient is pulseless. Pronounced dead at 00:15 (11/18/2015). Family at bedside.

## 2015-11-25 DEATH — deceased

## 2015-11-29 ENCOUNTER — Telehealth: Payer: Self-pay | Admitting: *Deleted

## 2015-11-29 NOTE — Telephone Encounter (Signed)
Hospice Supplemental Order sated 11/16/15 to 11/20/2015 signed by Dr Allyson SabalBerry and faxed to number provided.

## 2015-12-06 ENCOUNTER — Encounter (HOSPITAL_COMMUNITY): Admitting: Internal Medicine

## 2016-03-03 ENCOUNTER — Other Ambulatory Visit: Payer: Self-pay | Admitting: Nurse Practitioner

## 2016-06-28 IMAGING — CT CT HEAD W/O CM
3 of 4 series · 13 of 47 positions shown, 15 images · non-contrast
Comparison: Head CT dated [DATE].

CLINICAL DATA: Status post fall on [REDACTED]. Injury to back of
head. Slight headache. On blood thinners.

EXAM:
CT HEAD WITHOUT CONTRAST
TECHNIQUE: Contiguous axial images were obtained from the base of the skull
through the vertex without intravenous contrast.

[Series 3: head without · axial · non-contrast · 0.44mm/px · z∈[-224,-104]mm · 7 of 34 slices shown, 9 images]
[im 5/34  brain]
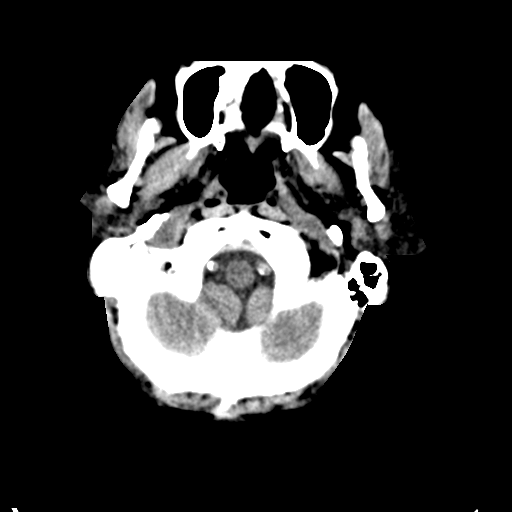
[im 5/34  bone]
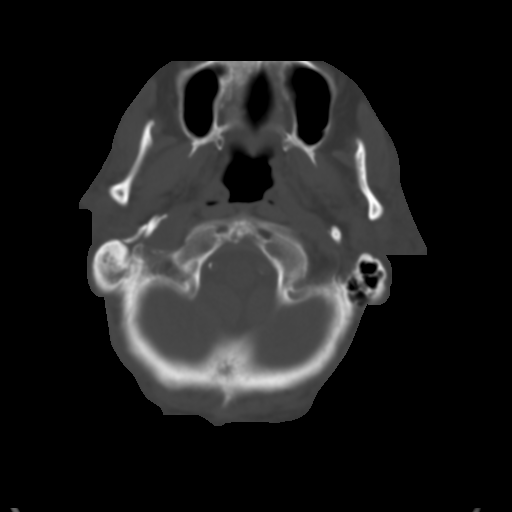
[im 9/34  brain]
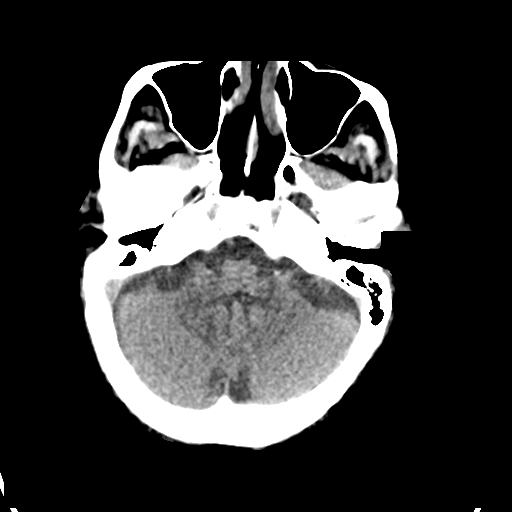
[im 13/34  brain]
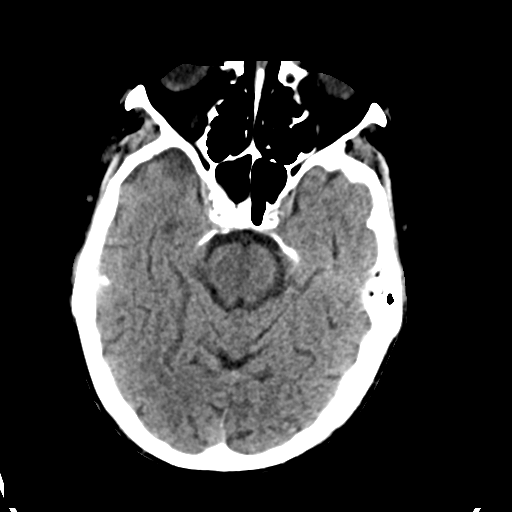
[im 17/34  brain]
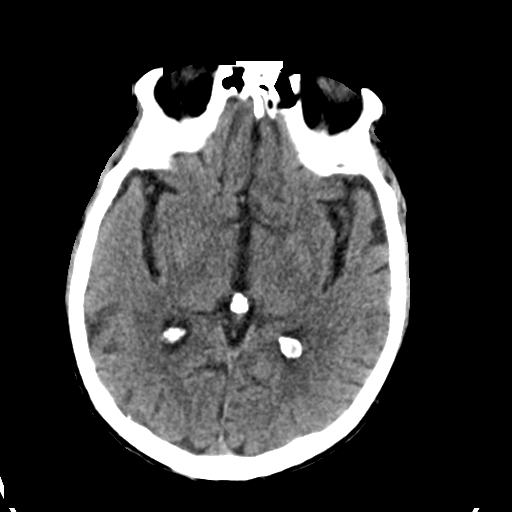
[im 21/34  brain]
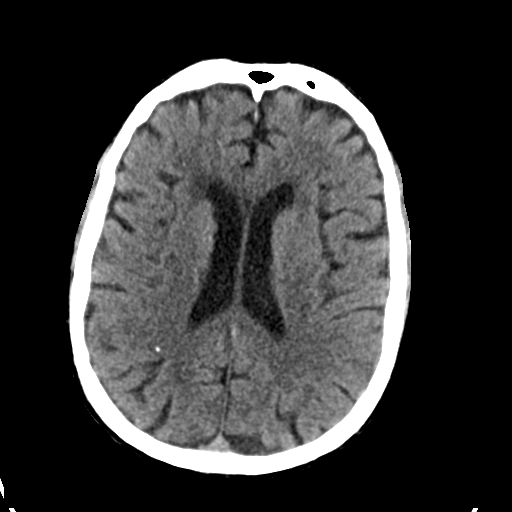
[im 21/34  bone]
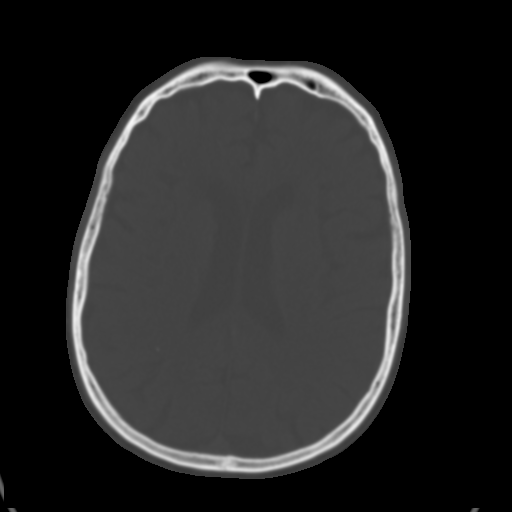
[im 25/34  brain]
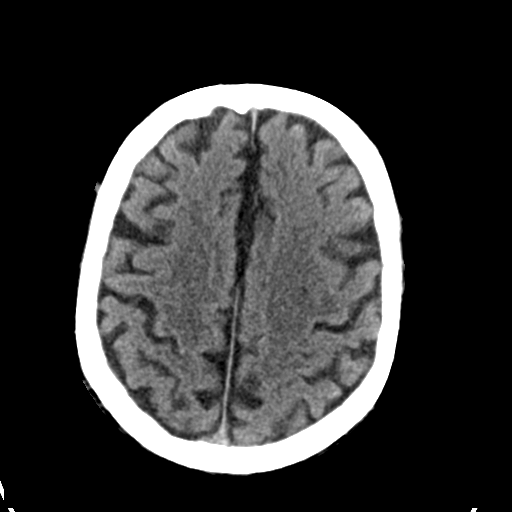
[im 29/34  brain]
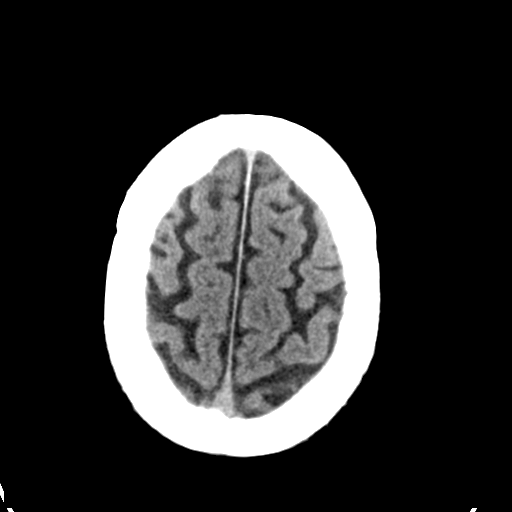

[Series 5: head without cor · coronal · non-contrast · 0.23mm/px · 3 of 66 slices shown]
[im 22/66  brain]
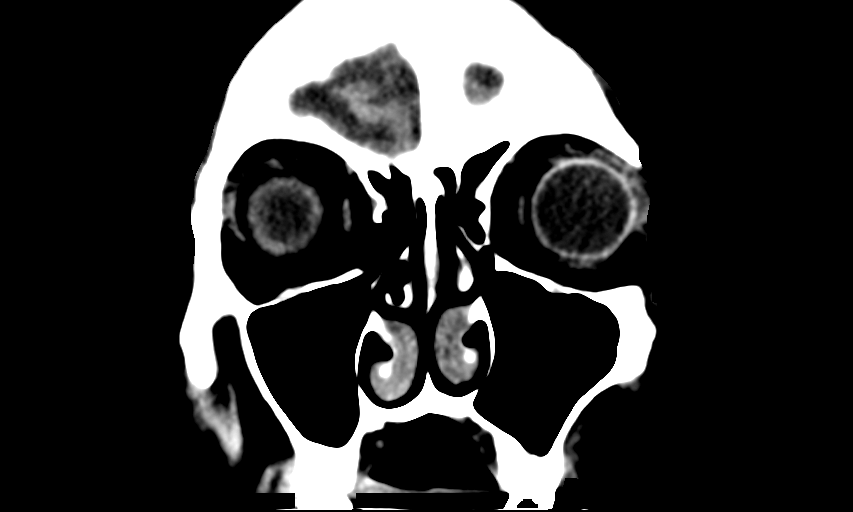
[im 29/66  brain]
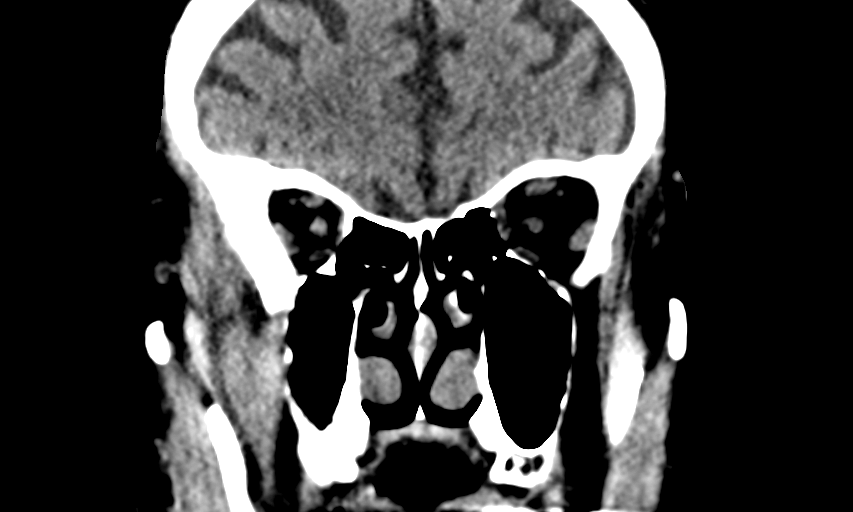
[im 37/66  brain]
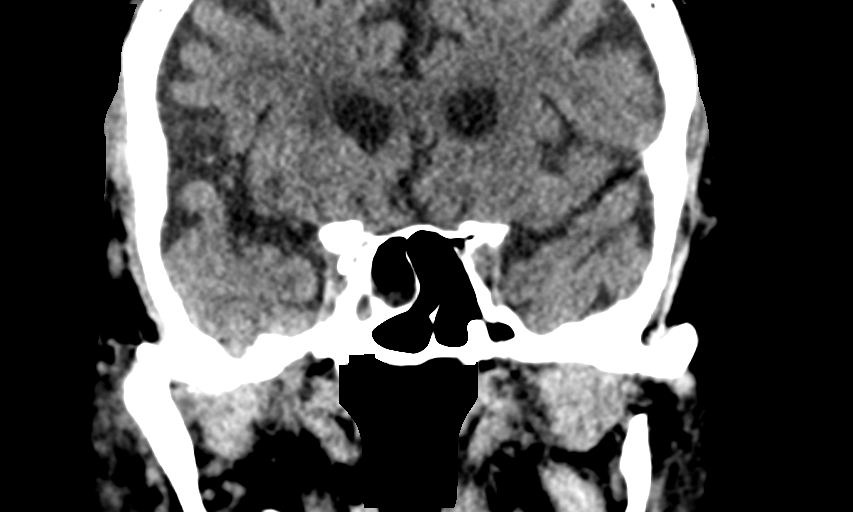

[Series 6: head without sag · sagittal · non-contrast · 0.23mm/px · 3 of 67 slices shown]
[im 23/67  brain]
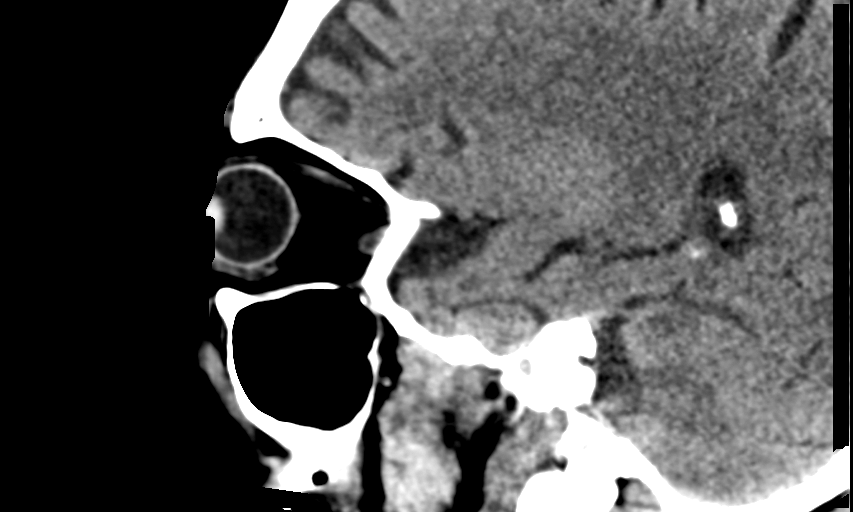
[im 34/67  brain]
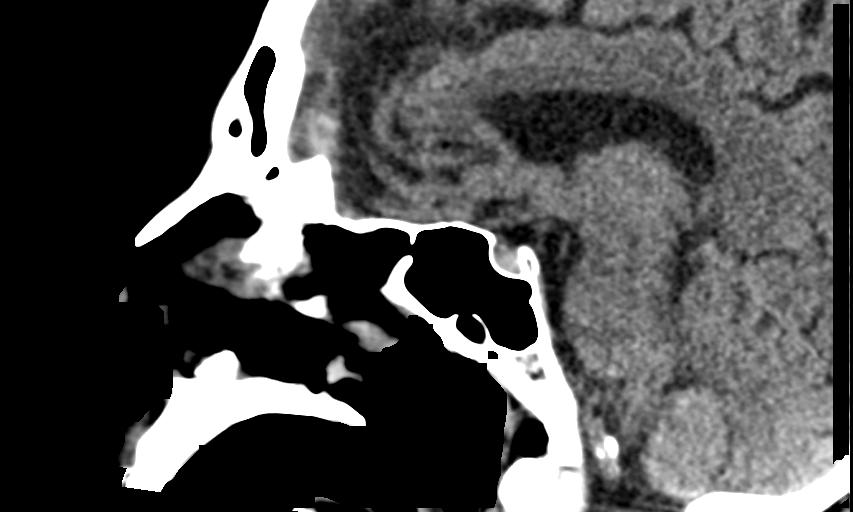
[im 45/67  brain]
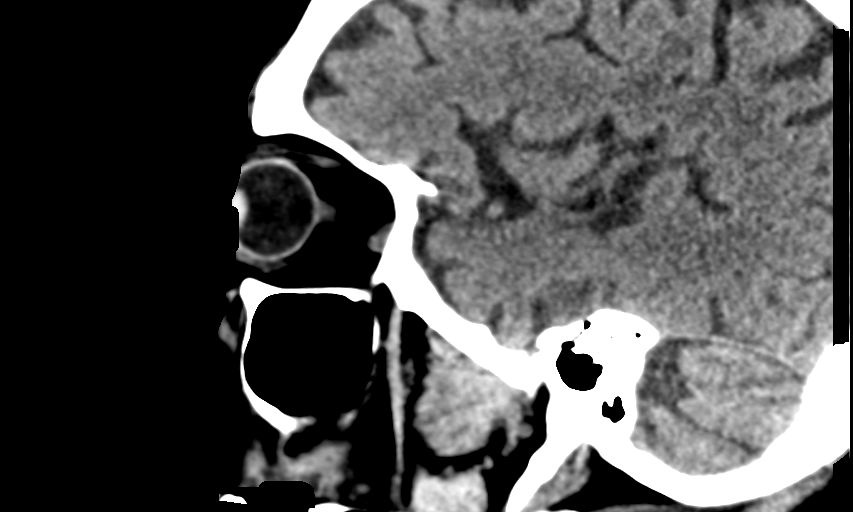

[13 of 47 positions shown; findings below may reference images not displayed]

FINDINGS: Brain: Generalized age related brain atrophy with commensurate
dilatation of the ventricles and sulci. Mild chronic small vessel
ischemic change in the deep periventricular white matter regions
bilaterally. Small old lacunar infarcts within the basal ganglia.

No mass, hemorrhage, edema or other evidence of acute parenchymal
abnormality. No extra-axial hemorrhage.

Vascular: No hyperdense vessel or unexpected calcification. There
are chronic calcified atherosclerotic changes of the large vessels
at the skull base.

Skull: Negative for fracture or focal lesion.

Sinuses/Orbits: Mucosal thickening within the right sphenoid sinus,
of uncertain chronicity, most likely chronic.

Other: No superficial soft tissue hematoma appreciated.
IMPRESSION: 1. Negative head CT. No intracranial hemorrhage or edema. No skull
fracture.
2. Right sphenoid sinus disease, of uncertain chronicity, most
likely chronic.
